# Patient Record
Sex: Male | Born: 1965 | Race: Black or African American | Hispanic: No | Marital: Married | Smoking: Former smoker
Health system: Southern US, Community
[De-identification: ages and names within clinical notes are randomized; demographics above are authoritative.]

## PROBLEM LIST (undated history)

## (undated) ENCOUNTER — Ambulatory Visit: Admission: EM | Payer: Medicare HMO | Source: Home / Self Care

## (undated) DIAGNOSIS — I671 Cerebral aneurysm, nonruptured: Secondary | ICD-10-CM

## (undated) DIAGNOSIS — Z973 Presence of spectacles and contact lenses: Secondary | ICD-10-CM

## (undated) DIAGNOSIS — M109 Gout, unspecified: Secondary | ICD-10-CM

## (undated) DIAGNOSIS — N189 Chronic kidney disease, unspecified: Secondary | ICD-10-CM

## (undated) DIAGNOSIS — I10 Essential (primary) hypertension: Secondary | ICD-10-CM

## (undated) DIAGNOSIS — Z9119 Patient's noncompliance with other medical treatment and regimen: Secondary | ICD-10-CM

## (undated) DIAGNOSIS — I7100 Dissection of unspecified site of aorta: Secondary | ICD-10-CM

## (undated) DIAGNOSIS — H9192 Unspecified hearing loss, left ear: Secondary | ICD-10-CM

## (undated) DIAGNOSIS — G4730 Sleep apnea, unspecified: Secondary | ICD-10-CM

## (undated) DIAGNOSIS — Z91199 Patient's noncompliance with other medical treatment and regimen due to unspecified reason: Secondary | ICD-10-CM

## (undated) HISTORY — DX: Essential (primary) hypertension: I10

## (undated) HISTORY — DX: Dissection of unspecified site of aorta: I71.00

## (undated) HISTORY — DX: Patient's noncompliance with other medical treatment and regimen: Z91.19

## (undated) HISTORY — PX: BACK SURGERY: SHX140

## (undated) HISTORY — DX: Patient's noncompliance with other medical treatment and regimen due to unspecified reason: Z91.199

---

## 1998-10-16 ENCOUNTER — Emergency Department (HOSPITAL_COMMUNITY): Admission: EM | Admit: 1998-10-16 | Discharge: 1998-10-16 | Payer: Self-pay | Admitting: Emergency Medicine

## 1998-10-17 ENCOUNTER — Emergency Department (HOSPITAL_COMMUNITY): Admission: EM | Admit: 1998-10-17 | Discharge: 1998-10-17 | Payer: Self-pay | Admitting: Emergency Medicine

## 1998-11-20 ENCOUNTER — Emergency Department (HOSPITAL_COMMUNITY): Admission: EM | Admit: 1998-11-20 | Discharge: 1998-11-20 | Payer: Self-pay | Admitting: Emergency Medicine

## 1998-11-30 ENCOUNTER — Observation Stay (HOSPITAL_COMMUNITY): Admission: RE | Admit: 1998-11-30 | Discharge: 1998-12-01 | Payer: Self-pay | Admitting: Specialist

## 1998-11-30 ENCOUNTER — Encounter: Payer: Self-pay | Admitting: Specialist

## 1999-03-06 ENCOUNTER — Encounter: Payer: Self-pay | Admitting: Specialist

## 1999-03-06 ENCOUNTER — Encounter (INDEPENDENT_AMBULATORY_CARE_PROVIDER_SITE_OTHER): Payer: Self-pay | Admitting: Specialist

## 1999-03-07 ENCOUNTER — Inpatient Hospital Stay (HOSPITAL_COMMUNITY): Admission: EM | Admit: 1999-03-07 | Discharge: 1999-03-08 | Payer: Self-pay | Admitting: Specialist

## 2001-01-14 ENCOUNTER — Emergency Department (HOSPITAL_COMMUNITY): Admission: EM | Admit: 2001-01-14 | Discharge: 2001-01-14 | Payer: Self-pay | Admitting: Emergency Medicine

## 2002-06-23 ENCOUNTER — Encounter: Payer: Self-pay | Admitting: Internal Medicine

## 2002-06-23 ENCOUNTER — Ambulatory Visit (HOSPITAL_COMMUNITY): Admission: RE | Admit: 2002-06-23 | Discharge: 2002-06-23 | Payer: Self-pay | Admitting: Internal Medicine

## 2002-07-18 ENCOUNTER — Ambulatory Visit (HOSPITAL_COMMUNITY): Admission: RE | Admit: 2002-07-18 | Discharge: 2002-07-18 | Payer: Self-pay | Admitting: Internal Medicine

## 2002-07-18 ENCOUNTER — Encounter: Payer: Self-pay | Admitting: Internal Medicine

## 2002-09-26 ENCOUNTER — Encounter: Payer: Self-pay | Admitting: Neurosurgery

## 2002-09-26 ENCOUNTER — Encounter: Admission: RE | Admit: 2002-09-26 | Discharge: 2002-09-26 | Payer: Self-pay | Admitting: Neurosurgery

## 2002-10-12 ENCOUNTER — Encounter: Admission: RE | Admit: 2002-10-12 | Discharge: 2002-10-12 | Payer: Self-pay | Admitting: Neurosurgery

## 2002-10-12 ENCOUNTER — Encounter: Payer: Self-pay | Admitting: Neurosurgery

## 2003-04-30 ENCOUNTER — Emergency Department (HOSPITAL_COMMUNITY): Admission: EM | Admit: 2003-04-30 | Discharge: 2003-04-30 | Payer: Self-pay | Admitting: Emergency Medicine

## 2004-10-08 ENCOUNTER — Inpatient Hospital Stay (HOSPITAL_COMMUNITY): Admission: EM | Admit: 2004-10-08 | Discharge: 2004-10-09 | Payer: Self-pay | Admitting: Emergency Medicine

## 2004-10-08 ENCOUNTER — Ambulatory Visit: Payer: Self-pay | Admitting: Cardiology

## 2004-10-09 ENCOUNTER — Encounter: Payer: Self-pay | Admitting: Cardiology

## 2004-10-11 ENCOUNTER — Encounter: Payer: Self-pay | Admitting: Cardiovascular Disease

## 2004-10-11 ENCOUNTER — Inpatient Hospital Stay (HOSPITAL_COMMUNITY): Admission: EM | Admit: 2004-10-11 | Discharge: 2004-10-15 | Payer: Self-pay | Admitting: Nurse Practitioner

## 2004-10-13 ENCOUNTER — Ambulatory Visit: Payer: Self-pay | Admitting: Hematology & Oncology

## 2004-10-16 ENCOUNTER — Ambulatory Visit: Payer: Self-pay | Admitting: Internal Medicine

## 2004-10-25 ENCOUNTER — Ambulatory Visit: Payer: Self-pay | Admitting: Cardiology

## 2004-10-25 ENCOUNTER — Emergency Department (HOSPITAL_COMMUNITY): Admission: EM | Admit: 2004-10-25 | Discharge: 2004-10-25 | Payer: Self-pay | Admitting: Emergency Medicine

## 2004-10-25 ENCOUNTER — Ambulatory Visit (HOSPITAL_COMMUNITY): Admission: RE | Admit: 2004-10-25 | Discharge: 2004-10-25 | Payer: Self-pay | Admitting: Cardiology

## 2004-11-22 ENCOUNTER — Ambulatory Visit: Payer: Self-pay | Admitting: Cardiology

## 2005-02-03 ENCOUNTER — Ambulatory Visit: Payer: Self-pay | Admitting: Cardiology

## 2005-03-04 ENCOUNTER — Ambulatory Visit: Payer: Self-pay | Admitting: *Deleted

## 2005-11-19 ENCOUNTER — Emergency Department (HOSPITAL_COMMUNITY): Admission: EM | Admit: 2005-11-19 | Discharge: 2005-11-19 | Payer: Self-pay | Admitting: Emergency Medicine

## 2006-10-19 ENCOUNTER — Emergency Department (HOSPITAL_COMMUNITY): Admission: EM | Admit: 2006-10-19 | Discharge: 2006-10-19 | Payer: Self-pay | Admitting: Emergency Medicine

## 2006-11-06 ENCOUNTER — Inpatient Hospital Stay (HOSPITAL_COMMUNITY): Admission: EM | Admit: 2006-11-06 | Discharge: 2006-12-02 | Payer: Self-pay | Admitting: Emergency Medicine

## 2006-11-24 ENCOUNTER — Ambulatory Visit: Payer: Self-pay | Admitting: Physical Medicine & Rehabilitation

## 2007-03-02 ENCOUNTER — Ambulatory Visit: Payer: Self-pay | Admitting: Cardiology

## 2007-03-05 ENCOUNTER — Ambulatory Visit (HOSPITAL_COMMUNITY): Admission: RE | Admit: 2007-03-05 | Discharge: 2007-03-05 | Payer: Self-pay | Admitting: Interventional Radiology

## 2007-03-16 ENCOUNTER — Encounter: Admission: RE | Admit: 2007-03-16 | Discharge: 2007-06-14 | Payer: Self-pay | Admitting: Neurosurgery

## 2007-03-30 ENCOUNTER — Encounter: Admission: RE | Admit: 2007-03-30 | Discharge: 2007-03-30 | Payer: Self-pay | Admitting: Neurosurgery

## 2007-06-15 ENCOUNTER — Encounter: Admission: RE | Admit: 2007-06-15 | Discharge: 2007-06-22 | Payer: Self-pay | Admitting: Neurosurgery

## 2007-09-02 ENCOUNTER — Ambulatory Visit (HOSPITAL_BASED_OUTPATIENT_CLINIC_OR_DEPARTMENT_OTHER): Admission: RE | Admit: 2007-09-02 | Discharge: 2007-09-02 | Payer: Self-pay | Admitting: *Deleted

## 2007-09-10 ENCOUNTER — Ambulatory Visit: Payer: Self-pay | Admitting: Internal Medicine

## 2007-09-22 ENCOUNTER — Emergency Department (HOSPITAL_COMMUNITY): Admission: EM | Admit: 2007-09-22 | Discharge: 2007-09-22 | Payer: Self-pay | Admitting: Emergency Medicine

## 2007-09-27 ENCOUNTER — Ambulatory Visit (HOSPITAL_BASED_OUTPATIENT_CLINIC_OR_DEPARTMENT_OTHER): Admission: RE | Admit: 2007-09-27 | Discharge: 2007-09-27 | Payer: Self-pay | Admitting: *Deleted

## 2007-10-03 ENCOUNTER — Ambulatory Visit: Payer: Self-pay | Admitting: Internal Medicine

## 2007-12-31 ENCOUNTER — Ambulatory Visit (HOSPITAL_COMMUNITY): Admission: RE | Admit: 2007-12-31 | Discharge: 2007-12-31 | Payer: Self-pay | Admitting: Interventional Radiology

## 2008-02-24 ENCOUNTER — Ambulatory Visit: Payer: Self-pay | Admitting: Cardiology

## 2008-03-22 IMAGING — XA IR ANGIO/CAROTID/CERV BI
1 series · 12 of 24 positions shown · IV contrast (omnipaque)
Comparison: none

CLINICAL DATA: The patient is status post subarachnoid hemorrhage due to ruptured ACOM aneurysm status post endovascular treatment.  
BILATERAL CAROTID ARTERIOGRAMS:
Procedure:  Following the full explanation of the procedure, along with the potentially associated complications, an informed witnessed consent was obtained. 
The right groin was prepped and draped in the usual sterile fashion.  Thereafter, using a modified Seldinger technique, transfemoral access into the right common femoral artery was obtained without difficulty.  Over a 0.035-inch guidewire, a 5-French Pinnacle sheath was inserted.  Through this and also over a 0.035-inch guidewire, a 5-French JB-1 catheter was advanced through the aortic arch region and selectively positioned in the right common carotid artery and the left common carotid artery.  There were no acute complications.  The patient tolerated the procedure well. 
Medications utilized:  Versed 1 mg IV, fentanyl 50 mcg IV. 
Contrast:  Omnipaque 300, approximately 25 cc.

[Series 1: run · 12 of 112 slices shown]
[im 5/112]
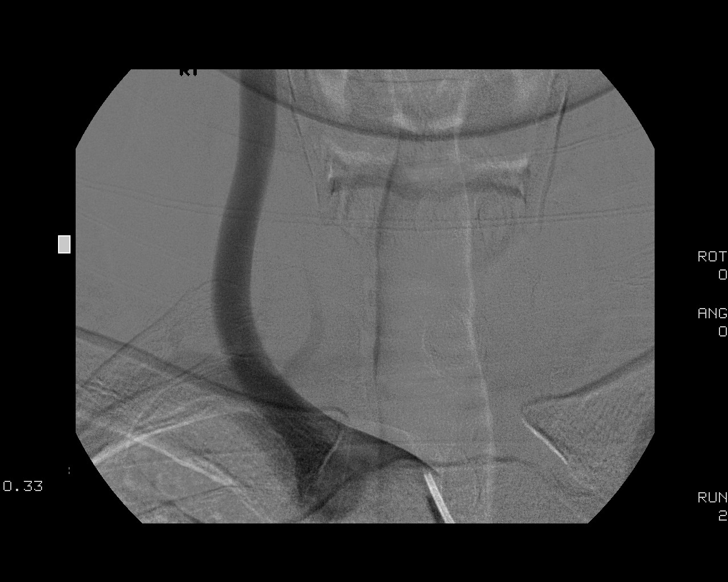
[im 15/112]
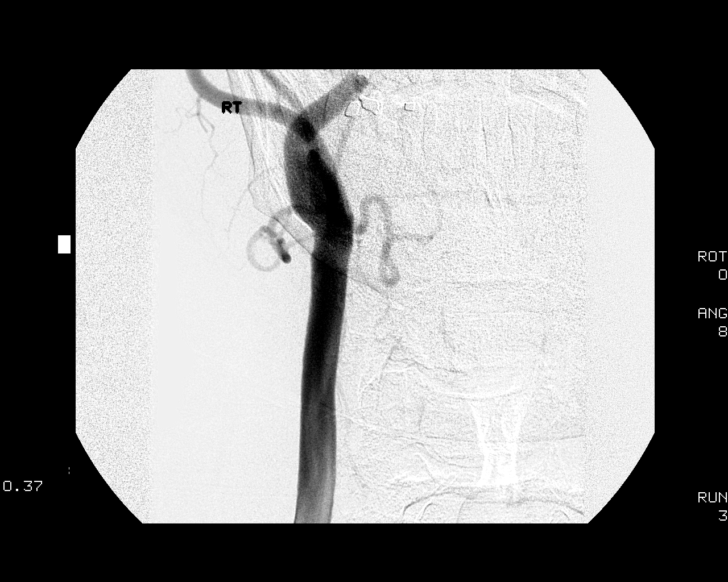
[im 25/112]
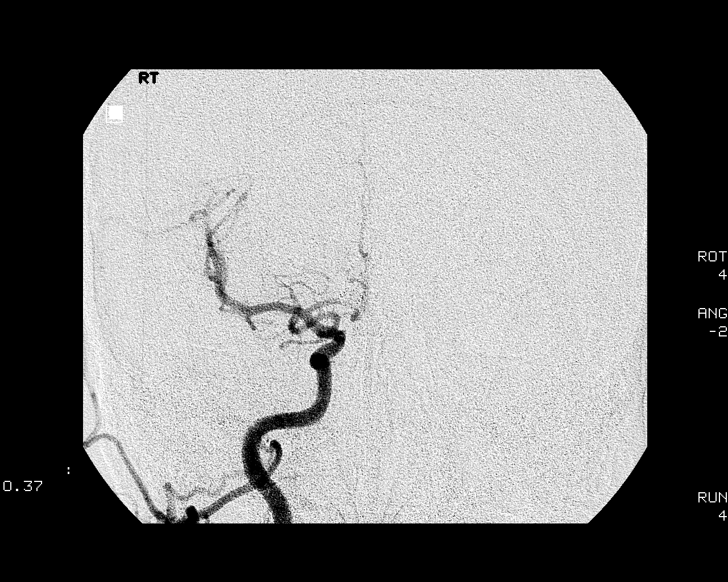
[im 34/112]
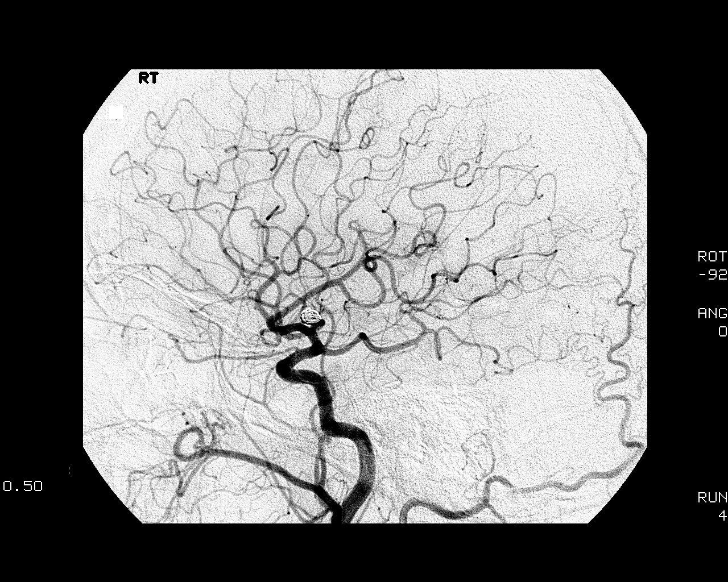
[im 44/112]
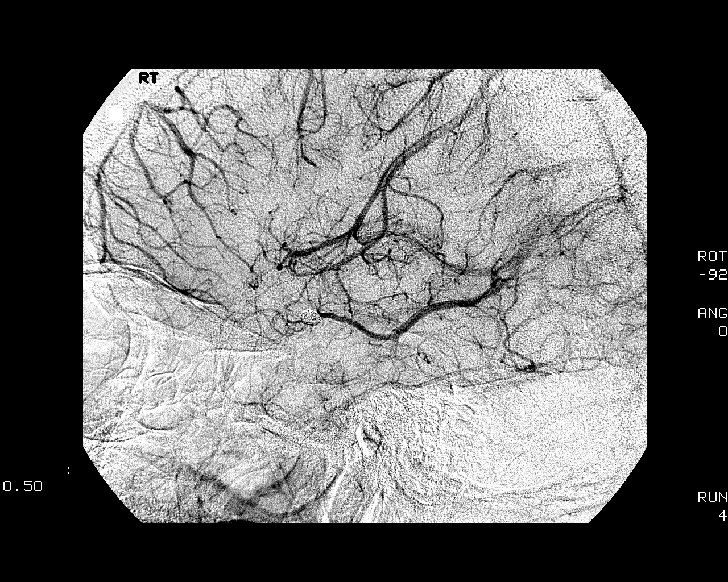
[im 54/112]
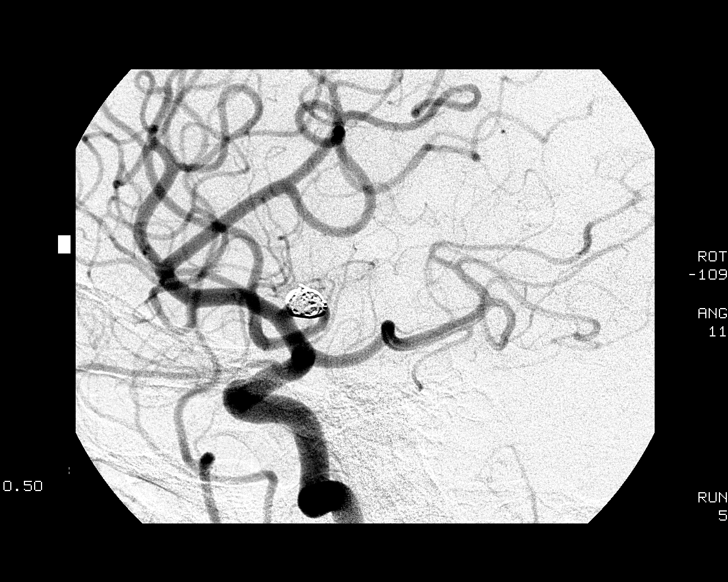
[im 63/112]
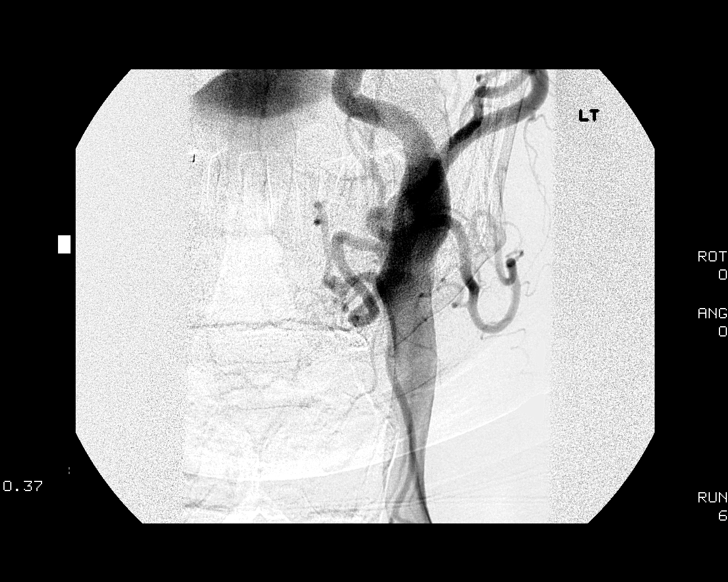
[im 73/112]
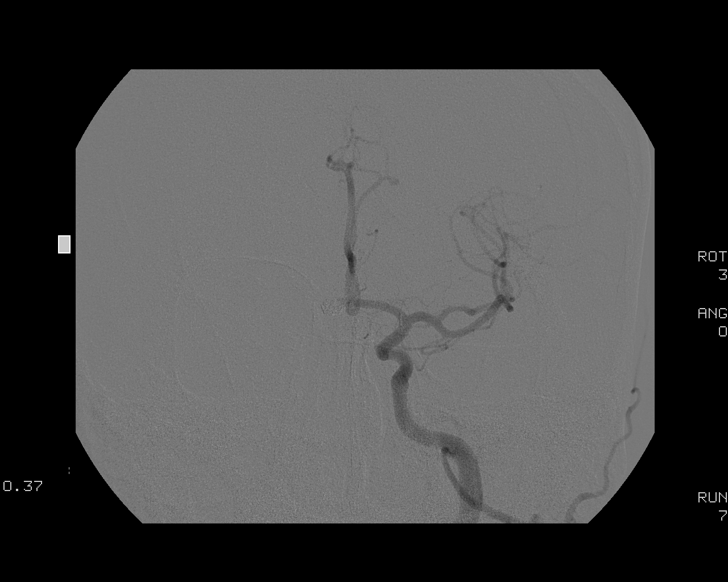
[im 83/112]
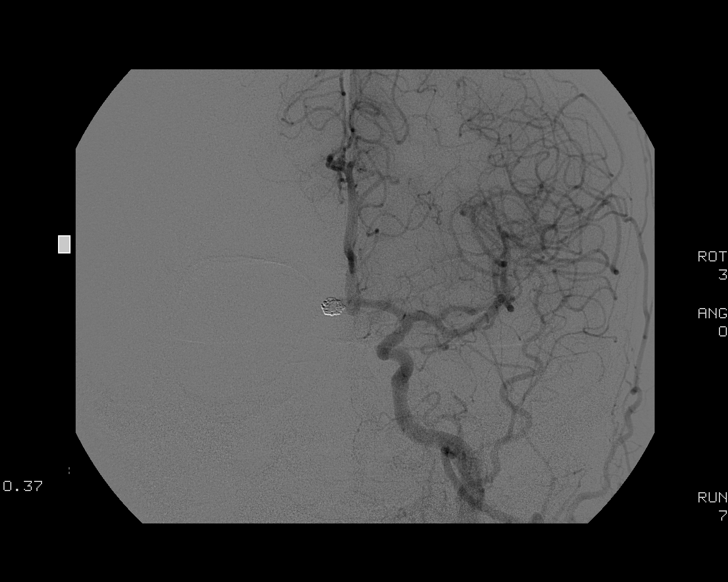
[im 92/112]
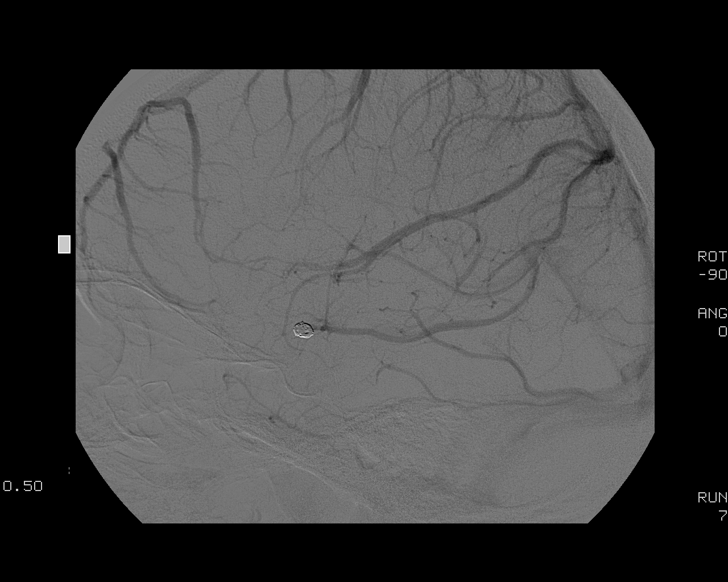
[im 102/112]
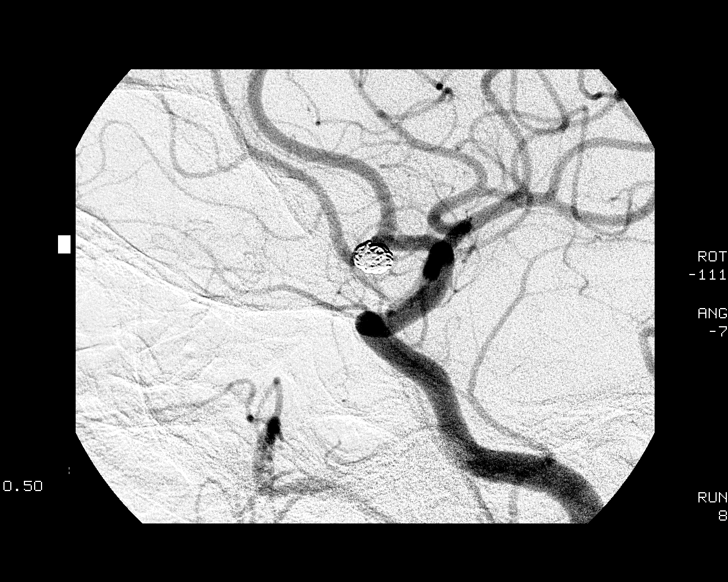
[im 112/112]
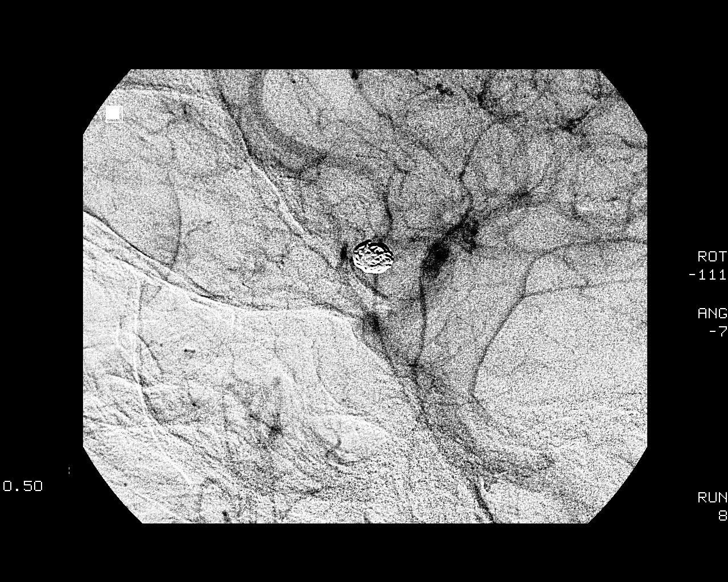

[12 of 24 positions shown; findings below may reference images not displayed]

FINDINGS: The right common carotid arteriogram demonstrates the right external carotid artery and its major branches to be normal.  The right internal carotid artery at the bulb to the cranial skull base is normal.  The petrous, the cavernous, and the supraclinoid segments are normal.  There is a dominant right PCOM opacifying the right PCA and right superior cerebellar artery distribution.  The right middle and the right anterior cerebral arteries are seen to opacify normally into capillary and venous phases.
The left common carotid arteriogram demonstrates the left external carotid artery and its major branches to be normal.  The left internal carotid artery at the bulb has a smooth shallow plaque along the posterior wall.  No significant stenosis is seen.  The vessel is otherwise seen to opacify normally to the cranial skull base.  Normal opacification is seen of the left ICA and its petrous, cavernous, and its supraclinoid segments.  The left middle and the left anterior cerebral arteries are seen to opacify normally into capillary and venous phases.  Opacification via the ACOM of the right ACA distal to the A2 is noted.
Normal opacification is seen of the previously endovascularly-treated ACOM region aneurysm positioned horizontally.  No coil compaction or re-canalization is seen.
IMPRESSION: Endovascularly obliterated ACOM region aneurysm without re-canalization or coil compaction.
The above results were reviewed with the patient.  He does not smoke.  He has been asked to continue taking his medications.  The patient will probably undergo follow-up arteriogram in approximately six to eight months from today.

## 2009-03-14 ENCOUNTER — Emergency Department (HOSPITAL_COMMUNITY): Admission: EM | Admit: 2009-03-14 | Discharge: 2009-03-14 | Payer: Self-pay | Admitting: Emergency Medicine

## 2009-11-23 ENCOUNTER — Encounter: Admission: RE | Admit: 2009-11-23 | Discharge: 2009-11-23 | Payer: Self-pay | Admitting: Internal Medicine

## 2010-05-19 ENCOUNTER — Emergency Department (HOSPITAL_COMMUNITY): Admission: EM | Admit: 2010-05-19 | Discharge: 2010-05-20 | Payer: Self-pay | Admitting: Emergency Medicine

## 2010-09-08 ENCOUNTER — Encounter: Payer: Self-pay | Admitting: Interventional Radiology

## 2010-10-31 LAB — COMPREHENSIVE METABOLIC PANEL
ALT: 10 U/L (ref 0–53)
AST: 12 U/L (ref 0–37)
Albumin: 3.5 g/dL (ref 3.5–5.2)
Alkaline Phosphatase: 71 U/L (ref 39–117)
BUN: 17 mg/dL (ref 6–23)
CO2: 32 mEq/L (ref 19–32)
Calcium: 9.1 mg/dL (ref 8.4–10.5)
Chloride: 101 mEq/L (ref 96–112)
Creatinine, Ser: 1.86 mg/dL — ABNORMAL HIGH (ref 0.4–1.5)
GFR calc Af Amer: 48 mL/min — ABNORMAL LOW (ref >60)
GFR calc non Af Amer: 39 mL/min — ABNORMAL LOW (ref >60)
Glucose, Bld: 97 mg/dL (ref 70–99)
Potassium: 4.2 mEq/L (ref 3.5–5.1)
Sodium: 137 mEq/L (ref 135–145)
Total Bilirubin: 0.6 mg/dL (ref 0.3–1.2)
Total Protein: 7.1 g/dL (ref 6.0–8.3)

## 2010-10-31 LAB — TROPONIN I: Troponin I: 0.01 ng/mL (ref 0.00–0.06)

## 2010-10-31 LAB — CBC
HCT: 44.5 % (ref 39.0–52.0)
Hemoglobin: 14.6 g/dL (ref 13.0–17.0)
MCH: 24.3 pg — ABNORMAL LOW (ref 26.0–34.0)
MCHC: 32.8 g/dL (ref 30.0–36.0)
MCV: 74.1 fL — ABNORMAL LOW (ref 78.0–100.0)
Platelets: 119 10*3/uL — ABNORMAL LOW (ref 150–400)
RBC: 6 MIL/uL — ABNORMAL HIGH (ref 4.22–5.81)
RDW: 16.7 % — ABNORMAL HIGH (ref 11.5–15.5)
WBC: 9.5 10*3/uL (ref 4.0–10.5)

## 2010-10-31 LAB — CK TOTAL AND CKMB (NOT AT ARMC)
CK, MB: 1.6 ng/mL (ref 0.3–4.0)
Relative Index: 1.3 (ref 0.0–2.5)
Total CK: 122 U/L (ref 7–232)

## 2010-10-31 LAB — LIPASE, BLOOD: Lipase: 267 U/L — ABNORMAL HIGH (ref 11–59)

## 2010-12-31 NOTE — Assessment & Plan Note (Signed)
[place] OFFICE NOTE   NAME:Downs, [name]                         MRN:          [id number]  DATE:03/02/2007                            DOB:          [birth year]    PRIMARY:  None.   REASON FOR PRESENTATION:  Evaluate the patient with aortic dissection.   HISTORY OF PRESENT ILLNESS:  The patient returns after a 2-year absence.  He has a history of aortic dissection and hypertension.  Since I last  saw him in March of this year, he was hospitalized with a ruptured  cerebral aneurysm.  Subarachnoid hemorrhage and resultant right-sided  hemiparesis.  He has recovered nicely, but still walks with a walker.  He is back at home living with his mom.  Through all of this, he  apparently had no cardiac complications, as we were not consulted.  He  said he did have some elevated blood pressures when he was recovering at  the rehabilitation home, but these have resolved.  He currently denies  any cardiac symptoms.  He gets around with a walker and ambulates fairly  well.  He has not been having any chest discomfort, neck or arm  discomfort.  He has been having no back pain.  He has no shortness of  breath, PND, or orthopnea.  He is not noticing any palpitations, pre-  syncope, or syncope.   PAST MEDICAL HISTORY:  Type B aortic dissection.  Hypertension with  medical noncompliance.  Previous tobacco use and polysubstance abuse (he  is avoiding both).   ALLERGIES:  None.   CURRENT MEDICATIONS:  1. Lisinopril hydrochlorothiazide 20/25 b.i.d.  2. Clonidine 0.1 mg if his blood pressure is over 160/90.  3. Amlodipine 5 mg daily.   REVIEW OF SYSTEMS:  As stated in the HPI and otherwise negative for  other systems.   PHYSICAL EXAMINATION:  The patient is in no distress.  Weight 217 pounds.  Blood pressure 139/89, heart rate 61 and regular.  HEENT:  Eyelids unremarkable.  Pupils equal, round, and reactive to  light.   Fundi not visualized.  Oral mucosa unremarkable.  NECK:  No jugular venous distension.  Wave form within normal limits.  Carotid upstroke brisk and symmetric.  No bruits.  No thyromegaly.  LYMPHATICS:  No cervical, axillary, or inguinal adenopathy.  LUNGS:  Clear to auscultation bilaterally.  BACK:  No costovertebral angle tenderness.  CHEST:  Unremarkable.  HEART:  PMI not displaced or sustained.  S1, S2 within normal limits.  No S3.  No S4.  No clicks, rubs, or murmurs.  ABDOMEN:  Flat.  Positive bowel sounds.  Normal in frequency and pitch.  No bruits, rebound, guarding.  No midline pulsatile masses,  hepatomegaly, splenomegaly.  SKIN:  No rashes, no nodules.  EXTREMITIES:  2+ pulses throughout.  No cyanosis, clubbing, or edema.  NEURO:  Oriented to person, time, and place.  Cranial nerves 2-12 are  intact.  Motor grossly intact.   EKG:  Sinus rhythm, rate 58.  Axis within normal limits.  Intervals  within normal limits.  No  acute ST-T wave changes.   ASSESSMENT AND PLAN:  1. Hypertension.  Blood pressure very well controlled now.  No further      changes will be made to his medical regimen.  No further      cardiovascular testing is suggested.  2. Aortic dissection.  He is not having any pain.  There is no      suggestion that this needs to be re-imaged.  The management will be      control of his blood pressure.  3. Followup.  Will see him back in 1 year or sooner if needed.     Minus Breeding, MD, [facility]  Electronically Signed    JH/MedQ  DD: 03/02/2007  DT: 03/03/2007  Job #: [id number]

## 2010-12-31 NOTE — Procedures (Signed)
NAME:  [name] Downs, [name] Downs                ACCOUNT NO.:  [id number]   MEDICAL RECORD NO.:  [id number]          PATIENT TYPE:  OUT   LOCATION:  SLEEP CENTER                 FACILITY:  [facility]   PHYSICIAN:  Clinton D. Annamaria Boots, MD, FCCP, FACPDATE OF BIRTH:  1964/06/24   DATE OF STUDY:  09/27/2007                            NOCTURNAL POLYSOMNOGRAM   REFERRING PHYSICIAN:  Lawanda Cousins, Dr.   Katy Apo FOR STUDY:  Hypersomnia with sleep apnea.   EPWORTH SLEEPINESS SCORE:  8/24, BMI 31.5, weight 232 pounds, height 72  inches, neck 15 inches.   HOME MEDICATIONS:  Charted and reviewed.   A baseline diagnostic NPSG on September 02, 2007, reported an AHI of 9.7  per hour.  CPAP titration is requested.   SLEEP ARCHITECTURE:  Total sleep time 334 minutes with sleep efficiency  89.9%.  Stage I was 6.6%, stage II 77.1%, stage III absent, REM 16.3% of  total sleep time.  Sleep latency 7 minutes.  REM latency 64 minutes.  Awake after sleep onset 30 minutes.  Arousal index 12.9.  Bedtime  medication included gabapentin, cyclobenzaprine, hydrocodone, and  methylprednisolone, all taken at 9:30 p.m.   RESPIRATORY DATA:  CPAP titration protocol.  CPAP was titrated to 11-  CWP, AHI zero per hour.  A medium Mirage Quattro mask was used with  heated humidifier.   OXYGEN DATA:  Snoring was essentially prevented by CPAP with oxygen  saturation holding at a mean of 96.6% on room air.   CARDIAC DATA:  Normal sinus rhythm.   MOVEMENT-PARASOMNIA:  No movement disturbance.  No bathroom trips.   IMPRESSIONS-RECOMMENDATIONS:  1. Successful CPAP titration to 11-CWP, apnea/hypopnea index zero per      hour.  He chose a medium Mirage Quattro full face mask with heated      humidifier.  2. Baseline diagnostic nocturnal polysomnogram on September 02, 2007,      had reported an apnea/hypopnea index of 9.7 per hour.      Clinton D. Annamaria Boots, MD, [facility], FACP  Diplomate, Tax adviser of Sleep Medicine  Electronically  Signed     CDY/MEDQ  D:  10/03/2007 10:47:06  T:  10/04/2007 16:30:43  Job:  [id number]

## 2010-12-31 NOTE — Assessment & Plan Note (Signed)
[place] OFFICE NOTE   NAME:[name] Downs                         MRN:          [id number]  DATE:02/24/2008                            DOB:          1965/10/31    PRIMARY CARE PHYSICIAN:  Nolene Ebbs, MD   REASON FOR PRESENTATION:  Evaluate patient with hypertension and aortic  dissection.   HISTORY OF PRESENT ILLNESS:  The patient returns for yearly followup.  Since I last saw him, he has done well.  He has had no further events  related to his hypertension or dissection.  He still lives with his  mother.  He is no longer walking with a walker.  He had to do this after  a cerebral aneurysm ruptured.  He has recovered quite a bit of strength,  but he still has some right leg weakness.  He has not been having any  chest pressure or neck or arm discomfort other than some right arm  tingling.  He still sees a neurologist and is supposed to get splints  for his wrist, but he has not worn these.  He has some fleeting chest  discomfort but this is rare.  He is able to do some walking.  He takes  his blood pressure medications.  He takes clonidine if his eyes are  red.  This has been a p.r.n. medication for hypertension, but he does  not have a blood pressure cuff at home.   PAST MEDICAL HISTORY:  1. Type B aortic dissection.  2. Hypertension, with medical noncompliance.  3. Subarachnoid hemorrhage, with resultant right-sided hemiparesis.  4. Ongoing tobacco use.   ALLERGIES:  None.   MEDICATIONS:  1. Gabapentin.  2. Lisinopril/HCT 20/25 b.i.d.  3. Clonidine p.r.n.  4. Amlodipine 5 mg daily.   REVIEW OF SYSTEMS:  As stated in the HPI and otherwise negative for  other systems.   PHYSICAL EXAMINATION:  GENERAL:  The patient is in no distress.  VITAL SIGNS:  Blood pressure 107/73, heart rate 70 and regular, weight  233 pounds, and body mass index 32.  NECK:  No jugular venous distention at 45 degrees.   Carotid upstroke  brisk and symmetrical.  No bruits, no thyromegaly.  LYMPHATICS:  No adenopathy.  LUNGS:  Clear to auscultation bilaterally.  BACK:  No costovertebral angle tenderness.  CHEST:  Unremarkable.  HEART:  PMI not displaced or sustained.  S1 and S2 within normal limits.  No S3, no S4, no clicks, no rubs, no murmurs.  ABDOMEN:  Mildly obese, positive bowel sounds, normal in frequency and  pitch.  No bruits, no rebound, no guarding, no midline pulsatile mass,  and no organomegaly.  SKIN:  No rashes, no nodules.  EXTREMITIES:  Pulses 2+.  No edema.   EKG:  Sinus rhythm, rate 72, axis within normal limits, intervals within  normal limits, no acute ST-wave changes.   ASSESSMENT AND PLAN:  1. Hypertension.  Blood pressure is currently well controlled.  No      further cardiovascular testing is suggested.  He will continue with  the medical regimen as listed.  2. Aortic dissection.  There is no evidence to suggest progression of      this.  No further cardiovascular testing is suggested.  The      management will continue to be management of his blood pressure.  3. Tobacco.  He was told to quit his 5 cigarettes a day and,      hopefully, he can comply with this.  4. Followup.  We will see him back in 1 year, sooner if needed.     Minus Breeding, MD, [facility]  Electronically Signed    JH/MedQ  DD: 02/24/2008  DT: 02/25/2008  Job #: [id number]

## 2010-12-31 NOTE — Procedures (Signed)
NAME:  [name] Downs, [name] Downs                ACCOUNT NO.:  [id number]   MEDICAL RECORD NO.:  [id number]          PATIENT TYPE:  OUT   LOCATION:  SLEEP CENTER                 FACILITY:  [facility]   PHYSICIAN:  Clinton D. Annamaria Boots, MD, FCCP, FACPDATE OF BIRTH:  09/06/1963   DATE OF STUDY:  09/02/2007                            NOCTURNAL POLYSOMNOGRAM   REFERRING PHYSICIAN:  Amil Amen, Dr   INDICATION FOR STUDY:  Hypersomnia with sleep apnea.   EPWORTH SLEEPINESS SCORE:  8/24, BMI 31.5, weight 232 pounds, height 72  inches, neck 15 inches.   HOME MEDICATIONS:  Charted and reviewed.   SLEEP ARCHITECTURE:  Total sleep time 345 minutes with sleep efficiency  65.2%.  Stage 1 was 8.1%, stage 2 73.8%, stage 3 absent, REM 18.1% of  total sleep time.  Sleep latency 47 minutes, REM latency 54 minutes,  awake after sleep onset 42 minutes, arousable index 4.5.  Gabapentin was  taken at bedtime.   RESPIRATORY DATA:  Apnea hypopnea index (AHI) of 9.7 obstructive events  per hour indicating mild obstructive sleep apnea/hypopnea syndrome.  Respiratory disturbance index (RDI) confirms at 10.6 per hour.  A total  of 56 respiratory events were scored of which 10 were obstructive  apneas, 3 central apneas and 43 hypopneas.  Most events were while  sleeping in sides.  Diagnostic NPSG was requested and CPAP titration was  therefore not done.   OXYGEN DATA:  Mild snoring with oxygen desaturation to a nadir of 90%.  Mean oxygen saturation through the study was 95.8% on room air.   CARDIAC DATA:  Normal sinus rhythm.   MOVEMENT-PARASOMNIA:  No significant movement disturbance.  Bathroom x1.   IMPRESSIONS-RECOMMENDATIONS:  1. Sleep architecture marked by frequent brief wakings through the      study which may reflect unfamiliar environment.  2. Mild obstructive sleep apnea/hypopnea syndrome, AHI 9.7 per hour      (normal range 0-5 per hour).  The events were most common while      sleeping on sides.  Mild  snoring with oxygen desaturation to a      nadir of 90%.  3. Scores in this range are borderline for treatment with CPAP.      Consider return for CPAP titration if      conservative measures including treatment for any significant      nasopharyngeal obstruction are unhelpful and symptoms or      complications of sleep apnea warrant ongoing therapy.  Weight loss      may be useful.      Clinton D. Annamaria Boots, MD, [facility], FACP  Diplomate, Tax adviser of Sleep Medicine  Electronically Signed     CDY/MEDQ  D:  09/11/2007 09:44:01  T:  09/11/2007 20:56:59  Job:  [id number]

## 2011-01-03 NOTE — H&P (Signed)
[name] Downs, [name] Downs                ACCOUNT NO.:  [id number]   MEDICAL RECORD NO.:  [id number]          PATIENT TYPE:  INP   LOCATION:  1824                         FACILITY:  [place]   PHYSICIAN:  Minus Breeding, M.D.   DATE OF BIRTH:  1965-12-29   DATE OF ADMISSION:  10/08/2004  DATE OF DISCHARGE:                                HISTORY & PHYSICAL   PRIMARY CARE PHYSICIAN:  [facility]   REASON FOR ADMISSION:  A patient with chest pain.   HISTORY OF PRESENT ILLNESS:  The patient is a 45 year old African-American  gentleman with no prior cardiac history.  He has longstanding hypertension  which he does not treat.  He says that he awoke this morning at 2 a.m. from  his sleep with chest pain.  He described it as a sharp somewhat stabbing  discomfort that would come and go in short bursts.  It was substernal. There  was no radiation to his arm or to his back. He had no discomfort between his  shoulder blades. He had no jaw discomfort.  It was 7/10 in intensity.  He  did notice that it seemed to be better when he walked.  It was better after  passing gas and burping. He thinks that it might be slightly related to  position. He had never had discomfort like this before.  There was no  associated nausea, vomiting, or diaphoresis.  He did get nitroglycerin in  the emergency room without relief.  He was hypertensive on admission.  He  had no acute EKG changes but does appear to have LVH with repolarization.  He had no enzyme elevations.   PAST MEDICAL HISTORY:  Hypertension x years (he is not compliant with  medications).  Tobacco abuse.   PAST SURGICAL HISTORY:  Back surgery x2.   ALLERGIES:  None.   MEDICATIONS:  Blood pressure medicines.  The patient does not know which  one.   SOCIAL HISTORY:  The patient lives in [place] with his mother. He is  single, he is unemployed.  He drinks 7-8 beers per day.  He remotely used  cocaine.  He smokes 1-1/2 packs per day and has smoked  5-6 years.   FAMILY HISTORY:  Noncontributory for early coronary artery disease, though  his mother is alive at age 32 with heart trouble.   REVIEW OF SYSTEMS:  As stated in the HPI, positive for sinus problems, dry  cough, blurred vision, indigestion, otherwise negative for all other  systems.   PHYSICAL EXAMINATION:  VITAL SIGNS:  The patient is in no distress.  Blood  pressure 171/105 on admission (currently 138/90), afebrile, heart rate 63  and regular.  HEENT:  Eyes unremarkable.  Pupils are equal, round, and reacted to light.  Fundi not visualized.  Oral mucosa unremarkable.  NECK:  No jugular venous distention.  Waveform within normal limits. Carotid  upstrokes brisk and symmetrical, no bruits.  No thyromegaly. Lymphatics, no  cervical, axillary or inguinal adenopathy.  LUNGS:  Clear to auscultation bilaterally.  BACK:  No costovertebral angle tenderness.  CHEST:  Unremarkable.  HEART:  PMI displaced and sustained.  S1-S2 within normal limits.  No S3-S4  or murmurs.  ABDOMEN:  Flat.  Positive bowel sounds, normal in frequency and pitch,  no  bruits.  No rebound, guarding, no midline pulsatile mass, no  hepatosplenomegaly or splenomegaly.  SKIN:  No rashes, or nodules.  EXTREMITIES:  2+ pulses throughout, no edema, no cyanosis or clubbing.  NEUROLOGIC:  Oriented to person, place and time.  Cranial nerves II-XII  grossly intact.  Motor grossly intact.   EKG:  Sinus bradycardia, rate 55, axis within normal limits, intervals  within normal limits.  Left ventricular hypertrophy with repolarization  changes.  No old EKGs for comparison.   LABS:  Point of care markers are negative x2.  WBC 11.7, hemoglobin 12.8,  platelets 112.  Sodium 142, potassium 3.3, creatinine 1.3.  Chest x-ray no  acute disease.   ASSESSMENT AND PLAN:  1.  Chest pain.  The patient's chest pain is atypical.  However, he does      have significant risk factors, therefore, I think proceeding with an       admission and ruling out myocardial infarction with symptoms would be      warranted.  The patient can then have a stress perfusion study.  I will      also get an echocardiogram to quantify the degree of LVH he might have      and also to rule out any effusion, though I do not hear a rub. If the      above tests are unremarkable.  The patient would be discharged with no      further cardiac workup.  2.  Hypertension.  Blood pressure was uncontrolled.  We will start him on      Lotensin as this may be what he was on at home. We will ask his mom to      bring in his medications to confirm.  I reiterated the need to take care      of his blood pressure.  3.  EtOH.  He will be counseled about the need to stop drinking.  4.  Tobacco.  He will be counseled about the need to stop smoking.  5.  Risk reduction.  We will check lipids.  6.  Low platelets.  Wonder if this is related to his alcohol and bone marrow      suppression; will followup on these with repeat CBC.      JH/MEDQ  D:  10/08/2004  T:  10/08/2004  Job:  [id number]

## 2011-01-03 NOTE — Discharge Summary (Signed)
NAMETOLLIVER, Downs                ACCOUNT NO.:  [id number]   MEDICAL RECORD NO.:  [id number]          PATIENT TYPE:  INP   LOCATION:  [id number]                         FACILITY:  [place] [facility]   PHYSICIAN:  Minus Breeding, M.D.   DATE OF BIRTH:  Feb 19, 1966   DATE OF ADMISSION:  10/08/2004  DATE OF DISCHARGE:  10/09/2004                           DISCHARGE SUMMARY - REFERRING   PROCEDURES:  1.  Adenosine Cardiolite October 09, 2004.  2.  A 2-D echocardiogram.   REASON FOR ADMISSION:  [name] is a 45 year old male, with no known history  of heart disease but with cardiac risk factors notable for hypertension and  tobacco, who presented to the emergency room with chest pain and an abnormal  electrocardiogram.  Please refer to admission note for full details.   LABORATORY DATA:  Normal hemoglobin, hematocrit and white count, platelets  110 on admission.  D-dimer 0.43.  Cardiac enzymes:  Normal CPK-MB and  troponin I markers (x3).  Potassium 3.3 on admission, 3.7 follow-up.  Normal  renal function.  Liver profile:  Total cholesterol 138, triglycerides 63,  HDL 54, LDL 71.   Admission chest x-ray:  No active disease.   HOSPITAL COURSE:  Patient was admitted for further evaluation of chest pain,  felt to be atypical, but in the setting of multiple cardiac risk factors and  abnormal electrocardiogram suggestive of inferolateral ST abnormalities with  symmetric T-wave inversion in the inferior leads.   Serial cardial markers were all within normal limits.  D-dimer was also  negative.   Patient was thus referred for stress testing.  He initially was placed on a  treadmill, but was unable to proceed secondary to persistent back pain.  This was changed to an adenosine and perfusion imaging revealed no evidence  of infarction/ischemia; EF 54%.   Additionally, 2-D echocardiogram  was done to exclude wall motion  abnormality, left ventricular hypertrophy, or possible pericardial effusion.  This  revealed preserved left ventricular function (50-55%) with no wall  motion abnormalities; mild aortic and mitral regurgitation, however, was  noted.   Patient was treated with Indocin for possible musculoskeletal etiology and  question of pericardial effusion.  Again, no pericardial effusion was noted  on echocardiogram.   Patient was also referred for smoking cessation consult.   Patient had no further chest discomfort.  He had run out of his  antihypertensive medications on admission and was placed on Lotensin 10  daily.  He will need to follow [facility] at [facility] for continued monitoring of  hypertension.   The patient will also be referred back to Dr. Percival Spanish with an office follow-  up for reassessment of mild valvular heart disease.  He will need to be  placed on SVE prophylaxis.   DISCHARGE MEDICATIONS:  1.  Lotensin 10 mg daily.  2.  Indocin 25 mg b.i.d. (x1 week).   INSTRUCTIONS:  1.  SVE prophylaxis prior to dental procedures.  2.  Stop smoking.  3.  Follow up with Dr. Minus Breeding on Friday, October 25, 2004, at 9:30      a[place]  4.  Follow up at [facility] for hypertension management.   DISCHARGE DIAGNOSES:  1.  Atypical chest pain.      1.  Normal cardiac enzymes.      2.  Normal adenosine Myoview, ejection fraction 54%, October 09, 2004.  2.  Mild valvular heart disease.      1.  Mild aortic/mitral regurgitation.  3.  Hypertension.  4.  Tobacco.  5.  History of substance abuse.  6.  Sinus bradycardia.  7.  Status post hypokalemia.      GS/MEDQ  D:  10/09/2004  T:  10/09/2004  Job:  [id number]   cc:   [facility] in [place]

## 2011-01-03 NOTE — Discharge Summary (Signed)
[name] Downs, [name] Downs                ACCOUNT NO.:  [id number]   MEDICAL RECORD NO.:  [id number]          PATIENT TYPE:  INP   LOCATION:  3025                         FACILITY:  [facility]   PHYSICIAN:  Faythe Ghee, M.D. 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   PRIMARY PROCEDURE:  Interventional radiology with intravascular coiling  of the aneurysm.   HISTORY:  Mr. Schowalter is a 45 year old gentleman in his usual state of  health until the morning of admission where he developed a severe  headache.  He came to the emergency room and was found to have a diffuse  subarachnoid hemorrhage.  Angiography showed an anterior communicating  artery aneurysm which was treated with coiling.  He was admitted for  hypovolemic therapy.  Over subsequent days, the patient became more  alert and responsive.  He was following complex commands.  He was  followed with serial CT scans which showed no evidence of significant  hydrocephalus and; therefore, no ventriculostomy was needed.  He  continued to show steady improvement and after a few weeks of  hypovolemic therapy and no evidence of vasospasm, hypovolemic therapy  was slowly withdrawn.  As he continued to progress, decisions were made  regarding his best disposition.  It was eventually decided that he was  best for skilled nursing facility and he was discharged on April 16.  His condition was remarkably improved versus admission.           ______________________________  Faythe Ghee, M.D.     ROK/MEDQ  D:  02/04/2007  T:  02/04/2007  Job:  [id number]

## 2011-01-03 NOTE — Discharge Summary (Signed)
[name] Downs, [name] Downs                ACCOUNT NO.:  [id number]   MEDICAL RECORD NO.:  [id number]          PATIENT TYPE:  INP   LOCATION:  3302                         FACILITY:  [facility]   PHYSICIAN:  Amiera Herzberg Dictator       DATE OF BIRTH:  1965/12/22   DATE OF ADMISSION:  10/11/2004  DATE OF DISCHARGE:  10/15/2004                                 DISCHARGE SUMMARY   CARDIOLOGIST:  Minus Breeding, M.D.   PRIMARY CARE:  [facility] in [place], [place].   DISCHARGING DIAGNOSES:  1.  Type B aortic dissection.  2.  Ethanol abuse.  3.  Low platelet count being followed by hemo-oncology.   PAST MEDICAL HISTORY:  1.  Hypertension.  2.  Tobacco abuse.  3.  Ethanol abuse.  4.  Remote use of cocaine.  5.  Back surgery x 2.  6.  Noncompliance with medications.   PROCEDURES THIS ADMISSION:  1.  A 2-D echocardiogram on October 09, 2004, showing an EF of 50-55%, no      ventricular regional wall motion abnormalities, mild aortic valve      regurgitation and mild mitral valve regurgitation.  2.  A 2-D echocardiogram repeated on October 11, 2004, showing an EF of 50-      55%, negative for pericardial effusion and left ventricular wall      thickness mildly increased.  3.  Portable chest x-ray on October 08, 2004, showing no active disease.  4.  Stress test on October 09, 2004.  No reversible or irreversible defect      seen to suggest ischemia or infarction.  EF calculated at 54%.  5.  Spiral CT of the chest on October 12, 2004.  Heart normal in size.      Mild left basilar atelectasis.  Small left pleural effusion and left      lower lobe atelectasis.  Type B aortic dissection.  False lumen and      thrombosed small left effusion.  No pulmonary embolus.  6.  CT of the abdomen on October 12, 2004.  The aortic dissection continues      into the upper abdomen and in this just above the origin of the celiac      artery.  7.  Ultrasound of the abdomen on October 12, 2004, negative  for gallstones.      No acute abnormality in the abdomen.  The thoracic aortic dissection      appears to terminate in the upper abdominal aorta.  There is no aortic      aneurysm.   DISCHARGING MEDICATIONS:  1.  Lotensin 10 mg p.o. b.i.d.  2.  Labetalol 400 mg p.o. b.i.d.  3.  Tylenol for general discomfort.   ACTIVITY:  As tolerated.   DIET:  Low salt.   SPECIAL INSTRUCTIONS:  Tobacco and alcohol cessation.   FOLLOWUP:  Follow up on October 25, 2004, at 9:30 a.m. with Dr. Percival Spanish.  The  patient will scheduled a followup appointment at [facility] at his  convenience.   HISTORY OF PRESENT ILLNESS:  This is a 45 year old African American  male,  discharged from [facility] prior to this admission for atypical chest pain.  At that time, cardiac workup was negative.  Stress test was negative.  On  the morning of this admission, he woke with sharp stabbing precordial  discomfort radiating to his upper abdomen and back.  He presented to the  emergency room for further workup.  The patient also had thrombocytopenia.   HOSPITAL COURSE:  Hemo-oncology consult placed.  They followed the patient  as needed based on further pending laboratory work.  The patient continued  to have chest pain throughout admission.  Medications adjusted for further  control of blood pressure in the setting of type B dissection.  ETOH  prophylaxis treatment for DTs given.  Dr. Percival Spanish in to see the patient.  Discussed at length with the patient the severity of his disease and the  need to take his medications for blood pressure control.  He also spoke with  the patient at length about the need to stop smoking, drinking and use of  cocaine.  The patient continued to improve.  Vital signs stable.  The plan  is to discharge home.  Discharge blood pressure 122/58, heart rate 56 and  patient saturating 98% on room air.   DISPOSITION:  Pending case management consult for medication assistance, the  patient will be  discharged home.      MB/MEDQ  D:  10/15/2004  T:  10/15/2004  Job:  [id number]   cc:   Minus Breeding, M.D.   [place], [place]

## 2011-01-03 NOTE — Discharge Summary (Signed)
[name] Downs, [name] Downs                ACCOUNT NO.:  [id number]   MEDICAL RECORD NO.:  [id number]          PATIENT TYPE:  INP   LOCATION:  3025                         FACILITY:  [facility]   PHYSICIAN:  Faythe Ghee, M.D. 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   PRIMARY PROCEDURE:  Interventional radiology coiling of aneurysm.   HISTORY:  Mr. Soliven is a 45 year old gentleman who was in his usual state  of health until early the morning of admission when he developed a  severe headache.  He came to the emergency room with increased blood  pressure.   HOSPITAL COURSE:  CT scan showed diffuse subarachnoid hemorrhage.  He  had an angiogram which showed an anterior communicating artery aneurysm  and was treated with coiling immediately.  After the procedure he was  treated with hypovolemic therapy along with Nimotop, albumin and Hespan.  He was extremely well hydrated and developed no signs of vasospasms in  spite of the large amount of subarachnoid blood.  After approximately 11  days of aggressive therapy, the fluids were decreased and yet he still  showed no signs of difficulties.  He was then able to increase his  activities.  CT scans on a serial basis showed no evidence of increase  in ventricular size.  By mid April, physical medicine was consulted.  Unfortunately they did not feel that he was a candidate for  comprehensive inpatient rehabilitation and they recommended some sort of  skilled nursing facility for intermittent treatment for the next few  weeks until he was able to be discharged.  That was arranged and on  April 16, he was ready for discharge.   DISCHARGE MEDICATIONS:  Pain medications but nothing else.   CONDITION ON DISCHARGE:  His condition was markedly improved  versus  admission.           ______________________________  Faythe Ghee, M.D.     ROK/MEDQ  D:  12/02/2006  T:  12/02/2006  Job:  [id number]

## 2011-01-03 NOTE — H&P (Signed)
[name] Downs, [name] Downs                ACCOUNT NO.:  [id number]   MEDICAL RECORD NO.:  [id number]          PATIENT TYPE:  INP   LOCATION:  [id number]                         FACILITY:  [facility]   PHYSICIAN:  Jacqulyn Ducking, M.D.  DATE OF BIRTH:  01/13/1966   DATE OF ADMISSION:  10/11/2004  DATE OF DISCHARGE:                                HISTORY & PHYSICAL   REFERRING PHYSICIAN:  Rae Lips, N.P. [facility].   PRIMARY CARE PHYSICIAN:  [facility].  Primary cardiologist is Minus Breeding, M.D.   HISTORY OF PRESENT ILLNESS:  A 45 year old gentleman discharged from [facility] two days ago after admission for atypical chest pain. Cardiac markers  and EKGs were unremarkable. A stress Myoview study was negative. He  initially did well, but awoke this morning with sharp and stabbing  precordial discomfort radiating to the upper abdomen and back. There was no  associated dyspnea nor diaphoresis. There was some mild nausea, but no  emesis. He took aspirin without relief. There is a pleuritic component to  the pain. It has been waxing and waning. It is worse when he is supine.   An echocardiogram was performed during his last admission and was negative  for pericardial fluid. Nonetheless, he was given a prescription for Indocin  which he has not yet filled.   Details of history are otherwise available from the notes of his recent  hospital admission.   PHYSICAL EXAMINATION:  GENERAL:  Pleasant, slightly uncomfortable, well-  proportioned gentleman in no acute distress.  VITAL SIGNS:  Temperature is 98.5, blood pressure 160/105, heart rate 55 and  regular, respirations 16, O2 saturation 99% on room air.  HEENT:  Anicteric sclerae.  NECK:  No jugular venous distension; normal carotid upstrokes without  bruits.  LUNGS:  Few bibasilar rales.  THORAX:  No chest wall tenderness.  CARDIAC: Normal first and second heart sounds; fourth heart sound present.  ABDOMEN: Soft and nontender; normal bowel  sounds; no organomegaly; no  masses.  EXTREMITIES: Normal distal pulses; no edema.   LABORATORY DATA:  Initial laboratory is unremarkable including a normal  white count, normal renal function. Potassium is 3.2. Platelets are 105.   EKG: Sinus bradycardia; prominent voltage; ST and T-wave abnormalities  consistent with inferolateral ischemia. These are unchanged from an EKG of  10/08/2004.   IMPRESSION:  Young gentleman with relatively modest cardiovascular risk  factors, EKG abnormalities and persistent atypical chest pain. He does have  significant hypertension. His EKG could reflect a slight left ventricular  hypertrophy which was not appreciated or apparent on his echocardiogram. A D-  dimer will be repeated. Amylase and lipase will also be obtained due to his  concurrent abdominal discomfort. An echocardiogram will be obtained since  his history is consistent with pericarditis, to look for newly developed  pericardial effusion. If all the studies are negative, we will plan to  proceed with cardiac catheterization on Monday to exclude coronary disease.   The patient has thrombocytopenia at present. This was also present a few  days ago. Hematology consultation will be obtained to further pursue this  issue.      RR/MEDQ  D:  10/11/2004  T:  10/11/2004  Job:  [id number]

## 2011-05-14 LAB — BASIC METABOLIC PANEL
BUN: 18
CO2: 26
Calcium: 9.2
Chloride: 103
Creatinine, Ser: 1.66 — ABNORMAL HIGH
GFR calc Af Amer: 55 — ABNORMAL LOW
GFR calc non Af Amer: 45 — ABNORMAL LOW
Glucose, Bld: 97
Potassium: 4.2
Sodium: 141

## 2011-05-14 LAB — CBC
HCT: 38.1 — ABNORMAL LOW
Hemoglobin: 12.7 — ABNORMAL LOW
MCHC: 33.3
MCV: 73.2 — ABNORMAL LOW
Platelets: 131 — ABNORMAL LOW
RBC: 5.21
RDW: 16 — ABNORMAL HIGH
WBC: 8.1

## 2011-05-14 LAB — APTT: aPTT: 28

## 2011-05-14 LAB — PROTIME-INR
INR: 1.1
Prothrombin Time: 14.1

## 2011-06-02 LAB — CBC
HCT: 36.3 — ABNORMAL LOW
Hemoglobin: 11.6 — ABNORMAL LOW
MCHC: 31.9
MCV: 72.8 — ABNORMAL LOW
Platelets: 154
RBC: 4.99
RDW: 17.1 — ABNORMAL HIGH
WBC: 6.7

## 2011-06-02 LAB — BASIC METABOLIC PANEL
BUN: 15
CO2: 28
Calcium: 9.2
Chloride: 108
Creatinine, Ser: 1.49
GFR calc Af Amer: >60
GFR calc non Af Amer: 51 — ABNORMAL LOW
Glucose, Bld: 104 — ABNORMAL HIGH
Potassium: 4
Sodium: 142

## 2011-06-02 LAB — APTT: aPTT: 29

## 2011-06-02 LAB — PROTIME-INR
INR: 1.1
Prothrombin Time: 14.2

## 2011-07-30 ENCOUNTER — Emergency Department (HOSPITAL_COMMUNITY)
Admission: EM | Admit: 2011-07-30 | Discharge: 2011-07-31 | Disposition: A | Discharge status: Home / Self Care | Payer: Self-pay | Attending: Emergency Medicine | Admitting: Emergency Medicine

## 2011-07-30 ENCOUNTER — Encounter: Payer: Self-pay | Admitting: *Deleted

## 2011-07-30 DIAGNOSIS — R0602 Shortness of breath: Secondary | ICD-10-CM | POA: Insufficient documentation

## 2011-07-30 DIAGNOSIS — R079 Chest pain, unspecified: Secondary | ICD-10-CM | POA: Insufficient documentation

## 2011-07-30 DIAGNOSIS — R109 Unspecified abdominal pain: Secondary | ICD-10-CM | POA: Insufficient documentation

## 2011-07-30 HISTORY — DX: Cerebral aneurysm, nonruptured: I67.1

## 2011-07-30 LAB — DIFFERENTIAL
Basophils Absolute: 0 10*3/uL (ref 0.0–0.1)
Basophils Relative: 0 % (ref 0–1)
Eosinophils Absolute: 0.1 10*3/uL (ref 0.0–0.7)
Eosinophils Relative: 0 % (ref 0–5)
Lymphocytes Relative: 19 % (ref 12–46)
Lymphs Abs: 2.7 10*3/uL (ref 0.7–4.0)
Monocytes Absolute: 1 10*3/uL (ref 0.1–1.0)
Monocytes Relative: 7 % (ref 3–12)
Neutro Abs: 10 10*3/uL — ABNORMAL HIGH (ref 1.7–7.7)
Neutrophils Relative %: 73 % (ref 43–77)

## 2011-07-30 LAB — CBC
HCT: 47 % (ref 39.0–52.0)
Hemoglobin: 15.3 g/dL (ref 13.0–17.0)
MCH: 24.2 pg — ABNORMAL LOW (ref 26.0–34.0)
MCHC: 32.6 g/dL (ref 30.0–36.0)
MCV: 74.2 fL — ABNORMAL LOW (ref 78.0–100.0)
Platelets: 178 10*3/uL (ref 150–400)
RBC: 6.33 MIL/uL — ABNORMAL HIGH (ref 4.22–5.81)
RDW: 15.5 % (ref 11.5–15.5)
WBC: 13.8 10*3/uL — ABNORMAL HIGH (ref 4.0–10.5)

## 2011-07-30 NOTE — ED Notes (Signed)
Pt in c/o intermittent right flank and RUQ pain x2 weeks, states he has taken reflux medication at home with relief but pain returned today, denies N/V

## 2011-07-31 ENCOUNTER — Emergency Department (HOSPITAL_COMMUNITY): Payer: Self-pay

## 2011-07-31 LAB — URINE MICROSCOPIC-ADD ON

## 2011-07-31 LAB — COMPREHENSIVE METABOLIC PANEL
ALT: 11 U/L (ref 0–53)
AST: 12 U/L (ref 0–37)
Albumin: 2.9 g/dL — ABNORMAL LOW (ref 3.5–5.2)
Alkaline Phosphatase: 113 U/L (ref 39–117)
BUN: 22 mg/dL (ref 6–23)
CO2: 26 mEq/L (ref 19–32)
Calcium: 10.7 mg/dL — ABNORMAL HIGH (ref 8.4–10.5)
Chloride: 101 mEq/L (ref 96–112)
Creatinine, Ser: 1.9 mg/dL — ABNORMAL HIGH (ref 0.50–1.35)
GFR calc Af Amer: 47 mL/min — ABNORMAL LOW (ref >90)
GFR calc non Af Amer: 40 mL/min — ABNORMAL LOW (ref >90)
Glucose, Bld: 91 mg/dL (ref 70–99)
Potassium: 4.4 mEq/L (ref 3.5–5.1)
Sodium: 136 mEq/L (ref 135–145)
Total Bilirubin: 0.2 mg/dL — ABNORMAL LOW (ref 0.3–1.2)
Total Protein: 7.8 g/dL (ref 6.0–8.3)

## 2011-07-31 LAB — URINALYSIS, ROUTINE W REFLEX MICROSCOPIC
Bilirubin Urine: NEGATIVE
Glucose, UA: NEGATIVE mg/dL
Ketones, ur: NEGATIVE mg/dL
Leukocytes, UA: NEGATIVE
Nitrite: NEGATIVE
Protein, ur: >300 mg/dL — AB
Specific Gravity, Urine: 1.026 (ref 1.005–1.030)
Urobilinogen, UA: 0.2 mg/dL (ref 0.0–1.0)
pH: 5.5 (ref 5.0–8.0)

## 2011-07-31 MED ORDER — HYDROCODONE-ACETAMINOPHEN 5-325 MG PO TABS
1.0000 | ORAL_TABLET | Freq: Once | ORAL | Status: AC
  Administered 2011-07-31: 1 via ORAL
  Filled 2011-07-31: qty 1

## 2011-07-31 MED ORDER — HYDROCODONE-ACETAMINOPHEN 5-325 MG PO TABS: 1.0000 | ORAL_TABLET | Freq: Four times a day (QID) | ORAL | Status: AC | PRN

## 2011-07-31 NOTE — ED Notes (Signed)
Patient transported to X-ray 

## 2011-07-31 NOTE — ED Provider Notes (Signed)
History     CSN: [id number] Arrival date & time: 07/30/2011  7:23 PM   First MD Initiated Contact with Patient 07/31/11 0020      Chief Complaint  Patient presents with  . Flank Pain    (Consider location/radiation/quality/duration/timing/severity/associated sxs/prior treatment) HPI This is a 45 year old black male with a history of a vague abdominal pain for 2 weeks that sometimes is in the left upper quadrant and sometimes is in the right lower quadrant. He is here with right upper quadrant pain that began yesterday morning about 3 AM. Awoke him from sleep. It is moderate in severity. It is worse lying supine and when taking deep breaths or coughing. He states he is somewhat short of breath. He states he is occasionally nauseated but never vomits. He has not had diarrhea or fever. It has not improved with over-the-counter Alka-Seltzer. It does not change with food. It is constant.  Past Medical History  Diagnosis Date  . Brain aneurysm     History reviewed. No pertinent past surgical history.  History reviewed. No pertinent family history.  History  Substance Use Topics  . Smoking status: Current Everyday Smoker  . Smokeless tobacco: Not on file  . Alcohol Use: No      Review of Systems  All other systems reviewed and are negative.    Allergies  Review of patient's allergies indicates no known allergies.  Home Medications   Current Outpatient Rx  Name Route Sig Dispense Refill  . ALLOPURINOL 300 MG PO TABS Oral Take 300 mg by mouth daily.      [name] CLONIDINE HCL 0.1 MG PO TABS Oral Take 0.1 mg by mouth 2 (two) times daily.      . COLCHICINE 0.6 MG PO TABS Oral Take 0.6 mg by mouth daily.      . INDOMETHACIN 50 MG PO CAPS Oral Take 50 mg by mouth 3 (three) times daily with meals.      . NEBIVOLOL HCL 5 MG PO TABS Oral Take 5 mg by mouth daily.        BP 182/104  Pulse 55  Temp(Src) 98.2 F (36.8 C) (Oral)  Resp 20  SpO2 96%  Physical Exam General:  Well-developed, well-nourished male in no acute distress; appearance consistent with age of record HENT: normocephalic, atraumatic Eyes: Normal appearance Neck: supple Heart: regular rate and rhythm; no murmurs, rubs or gallops Lungs: Decreased air movement bilaterally without frank wheezing Abdomen: soft; right upper quadrant tenderness when supine but not when sitting upright; nondistended; bowel sounds present Extremities: No deformity; full range of motion Neurologic: Awake, alert and oriented; motor function intact in all extremities and symmetric; no facial droop Skin: Warm and dry Psychiatric: Normal mood and affect    ED Course  Procedures (including critical care time)   MDM   Nursing notes and vitals signs, including pulse oximetry, reviewed.  Summary of this visit's results, reviewed by myself:  Labs:  Results for orders placed during the hospital encounter of 07/30/11  CBC      Component Value Range   WBC 13.8 (*) 4.0 - 10.5 (K/uL)   RBC 6.33 (*) 4.22 - 5.81 (MIL/uL)   Hemoglobin 15.3  13.0 - 17.0 (g/dL)   HCT 47.0  39.0 - 52.0 (%)   MCV 74.2 (*) 78.0 - 100.0 (fL)   MCH 24.2 (*) 26.0 - 34.0 (pg)   MCHC 32.6  30.0 - 36.0 (g/dL)   RDW 15.5  11.5 - 15.5 (%)   Platelets  178  150 - 400 (K/uL)  DIFFERENTIAL      Component Value Range   Neutrophils Relative 73  43 - 77 (%)   Neutro Abs 10.0 (*) 1.7 - 7.7 (K/uL)   Lymphocytes Relative 19  12 - 46 (%)   Lymphs Abs 2.7  0.7 - 4.0 (K/uL)   Monocytes Relative 7  3 - 12 (%)   Monocytes Absolute 1.0  0.1 - 1.0 (K/uL)   Eosinophils Relative 0  0 - 5 (%)   Eosinophils Absolute 0.1  0.0 - 0.7 (K/uL)   Basophils Relative 0  0 - 1 (%)   Basophils Absolute 0.0  0.0 - 0.1 (K/uL)  COMPREHENSIVE METABOLIC PANEL      Component Value Range   Sodium 136  135 - 145 (mEq/L)   Potassium 4.4  3.5 - 5.1 (mEq/L)   Chloride 101  96 - 112 (mEq/L)   CO2 26  19 - 32 (mEq/L)   Glucose, Bld 91  70 - 99 (mg/dL)   BUN 22  6 - 23 (mg/dL)    Creatinine, Ser 1.90 (*) 0.50 - 1.35 (mg/dL)   Calcium 10.7 (*) 8.4 - 10.5 (mg/dL)   Total Protein 7.8  6.0 - 8.3 (g/dL)   Albumin 2.9 (*) 3.5 - 5.2 (g/dL)   AST 12  0 - 37 (U/L)   ALT 11  0 - 53 (U/L)   Alkaline Phosphatase 113  39 - 117 (U/L)   Total Bilirubin 0.2 (*) 0.3 - 1.2 (mg/dL)   GFR calc non Af Amer 40 (*) >90 (mL/min)   GFR calc Af Amer 47 (*) >90 (mL/min)   Dg Chest 2 View  07/31/2011  *RADIOLOGY REPORT*  Clinical Data: Shortness of breath and upper chest pain.  CHEST - 2 VIEW  Comparison: 11/12/2006  Findings: Shallow inspiration.  Infiltration or atelectasis in both lung bases, slightly greater on the right.  Suggestion of mild blunting of the costophrenic angles which might represent small effusions.  Early pneumonia is not excluded.  Borderline heart size with normal pulmonary vascularity, given the technique.  Tortuous and ectatic aorta.  No pneumothorax.  IMPRESSION: Atelectasis or infiltration in both lung bases with suggestion of small pleural effusions.  Original Report Authenticated By: Neale Burly, M.D.   US Abdomen Complete  07/31/2011  *RADIOLOGY REPORT*  Clinical Data:  Right upper quadrant pain.  COMPLETE ABDOMINAL ULTRASOUND  Comparison:  CT 05/19/2010  Findings:  Gallbladder:  The gallbladder is somewhat contracted.  This is nonspecific and might be due to nonfasting state, although the patient reports being fasting.  There is no gallbladder wall thickening.  No stones are demonstrated.  No pericholecystic edema.  Common bile duct:  Normal caliber.  Diameter measured at 5 mm.  Liver:  Diffusely  coarsened appearance of the liver parenchymal echotexture which might represent infiltrative changes such as cirrhosis or fatty infiltration.  Correlation with liver function studies is recommended.  Suggestion of mild central bile duct dilatation.  IVC:  Appears normal.  Pancreas:  Homogeneous appearance of parenchymal echotexture.  Spleen:  Spleen length measures 8.1  cm.  Homogeneous parenchymal echotexture.  Right Kidney:  Right kidney measures 10 cm length.  No hydronephrosis.  Left Kidney:  Left kidney measures 10.5 cm length.  No hydronephrosis.  Abdominal aorta:  Limited visualization of the abdominal aorta. Visualized segments are normal in caliber.  IMPRESSION: Nonspecific gallbladder contraction without wall thickening or stone.  Suggestion of mild intrahepatic bile duct dilatation with a  coarsened liver echotexture.  Correlation with liver function studies is recommended.  Original Report Authenticated By: Neale Burly, M.D.   3:37 AM Patient denies fevers or other symptoms of acute illness. He was advised of his lab and x-ray findings and need for followup with his primary care physician for further evaluation.          Wynetta Fines, MD 07/31/11 [phone]

## 2011-07-31 NOTE — ED Notes (Signed)
Pt c/o flank pain reports that it first started on the left side now it is in the right side reports that the pain is worse if he coughs and if he lays down also reports that while laying down he becomes Shob. Pt given a ua cup to provide a specimen reports that he cannot urinate at this time.

## 2011-07-31 NOTE — ED Notes (Signed)
Patient returned from X-ray 

## 2013-11-23 ENCOUNTER — Encounter (HOSPITAL_COMMUNITY): Payer: Self-pay | Admitting: Emergency Medicine

## 2013-11-23 ENCOUNTER — Emergency Department (HOSPITAL_COMMUNITY)
Admission: EM | Admit: 2013-11-23 | Discharge: 2013-11-23 | Disposition: A | Discharge status: Home / Self Care | Payer: Medicare Other | Attending: Emergency Medicine | Admitting: Emergency Medicine

## 2013-11-23 DIAGNOSIS — G8929 Other chronic pain: Secondary | ICD-10-CM | POA: Insufficient documentation

## 2013-11-23 DIAGNOSIS — I10 Essential (primary) hypertension: Secondary | ICD-10-CM

## 2013-11-23 DIAGNOSIS — F17200 Nicotine dependence, unspecified, uncomplicated: Secondary | ICD-10-CM | POA: Insufficient documentation

## 2013-11-23 DIAGNOSIS — Z760 Encounter for issue of repeat prescription: Secondary | ICD-10-CM | POA: Insufficient documentation

## 2013-11-23 DIAGNOSIS — Z79899 Other long term (current) drug therapy: Secondary | ICD-10-CM | POA: Insufficient documentation

## 2013-11-23 MED ORDER — CLONIDINE HCL 0.1 MG PO TABS: 0.1000 mg | ORAL_TABLET | Freq: Two times a day (BID) | ORAL | Status: DC

## 2013-11-23 MED ORDER — NEBIVOLOL HCL 5 MG PO TABS: 5.0000 mg | ORAL_TABLET | Freq: Every day | ORAL | Status: DC

## 2013-11-23 NOTE — ED Provider Notes (Signed)
CSN: [id number]     Arrival date & time 11/23/13  1038 History   First MD Initiated Contact with Patient 11/23/13 1138     Chief Complaint  Patient presents with  . Hypertension  . Medication Refill     (Consider location/radiation/quality/duration/timing/severity/associated sxs/prior Treatment) HPI  48 year old male with history of brain aneurysm and history of hypertension who presents requesting for medication refill. Patient states he has not taken his hypertension medication for the past year due to financial reasons. He also endorsed chronic headache for the past 5 months. No active headache at this time. States he drinks of water and vinegar "to help with headache". Currently denies having any fever, chills, double vision, neck pain, chest pain, shortness of breath, abdominal pain, back pain, numbness or weakness.  He is here because he finally got his medicaid card, and have a doctor assigned to him but would not be able to set an appointment for him until next week.    Past Medical History  Diagnosis Date  . Brain aneurysm    No past surgical history on file. No family history on file. History  Substance Use Topics  . Smoking status: Current Every Day Smoker  . Smokeless tobacco: Not on file  . Alcohol Use: No    Review of Systems  All other systems reviewed and are negative.     Allergies  Review of patient's allergies indicates no known allergies.  Home Medications   Current Outpatient Rx  Name  Route  Sig  Dispense  Refill  . allopurinol (ZYLOPRIM) 300 MG tablet   Oral   Take 300 mg by mouth daily.           . cloNIDine (CATAPRES) 0.1 MG tablet   Oral   Take 0.1 mg by mouth 2 (two) times daily.           . colchicine 0.6 MG tablet   Oral   Take 0.6 mg by mouth daily.           . indomethacin (INDOCIN) 50 MG capsule   Oral   Take 50 mg by mouth 3 (three) times daily with meals.           . nebivolol (BYSTOLIC) 5 MG tablet   Oral   Take 5 mg  by mouth daily.            BP 188/119  Pulse 71  Temp(Src) 99.5 F (37.5 C) (Oral)  Resp 16  SpO2 99% Physical Exam  Nursing note and vitals reviewed. Constitutional: He is oriented to person, place, and time. He appears well-developed and well-nourished. No distress.  Awake, alert, nontoxic appearance  HENT:  Head: Atraumatic.  Eyes: Conjunctivae are normal. Right eye exhibits no discharge. Left eye exhibits no discharge.  Neck: Normal range of motion. Neck supple.  No nuchal rigidity  Cardiovascular: Normal rate and regular rhythm.   Pulmonary/Chest: Effort normal. No respiratory distress. He exhibits no tenderness.  Abdominal: Soft. There is no tenderness. There is no rebound.  Musculoskeletal: He exhibits no tenderness.  ROM appears intact, no obvious focal weakness  Neurological: He is alert and oriented to person, place, and time.  Skin: Skin is warm and dry. No rash noted.  Psychiatric: He has a normal mood and affect.    ED Course  Procedures (including critical care time)  11:40 AM Pt is here for medication refill of his HTN med.  He is hypertensive at 188/119, this is likely chronic in nature.  No other complaints at this time concerning for end organ damage.  Will prescribe clonidine 0.1mg  tab, and bystolic 5mg  tab that was previously prescribed to him.  Strongly urge pt to f/u with PCP for closer management of his medical condition.  Recommend DASH diet along with exercise.  Return precaution discussed.    Labs Review Labs Reviewed - No data to display Imaging Review No results found.   EKG Interpretation None      MDM   Final diagnoses:  HTN (hypertension)  Encounter for medication refill    BP 188/119  Pulse 71  Temp(Src) 99.5 F (37.5 C) (Oral)  Resp 16  SpO2 99%     Domenic Moras, PA-C 11/23/13 1148

## 2013-11-23 NOTE — Discharge Instructions (Signed)
Hypertension As your heart beats, it forces blood through your arteries. This force is your blood pressure. If the pressure is too high, it is called hypertension (HTN) or high blood pressure. HTN is dangerous because you may have it and not know it. High blood pressure may mean that your heart has to work harder to pump blood. Your arteries may be narrow or stiff. The extra work puts you at risk for heart disease, stroke, and other problems.  Blood pressure consists of two numbers, a higher number over a lower, 110/72, for example. It is stated as "110 over 72." The ideal is below 120 for the top number (systolic) and under 80 for the bottom (diastolic). Write down your blood pressure today. You should pay close attention to your blood pressure if you have certain conditions such as:  Heart failure.  Prior heart attack.  Diabetes  Chronic kidney disease.  Prior stroke.  Multiple risk factors for heart disease. To see if you have HTN, your blood pressure should be measured while you are seated with your arm held at the level of the heart. It should be measured at least twice. A one-time elevated blood pressure reading (especially in the Emergency Department) does not mean that you need treatment. There may be conditions in which the blood pressure is different between your right and left arms. It is important to see your caregiver soon for a recheck. Most people have essential hypertension which means that there is not a specific cause. This type of high blood pressure may be lowered by changing lifestyle factors such as:  Stress.  Smoking.  Lack of exercise.  Excessive weight.  Drug/tobacco/alcohol use.  Eating less salt. Most people do not have symptoms from high blood pressure until it has caused damage to the body. Effective treatment can often prevent, delay or reduce that damage. TREATMENT  When a cause has been identified, treatment for high blood pressure is directed at the  cause. There are a large number of medications to treat HTN. These fall into several categories, and your caregiver will help you select the medicines that are best for you. Medications may have side effects. You should review side effects with your caregiver. If your blood pressure stays high after you have made lifestyle changes or started on medicines,   Your medication(s) may need to be changed.  Other problems may need to be addressed.  Be certain you understand your prescriptions, and know how and when to take your medicine.  Be sure to follow up with your caregiver within the time frame advised (usually within two weeks) to have your blood pressure rechecked and to review your medications.  If you are taking more than one medicine to lower your blood pressure, make sure you know how and at what times they should be taken. Taking two medicines at the same time can result in blood pressure that is too low. SEEK IMMEDIATE MEDICAL CARE IF:  You develop a severe headache, blurred or changing vision, or confusion.  You have unusual weakness or numbness, or a faint feeling.  You have severe chest or abdominal pain, vomiting, or breathing problems. MAKE SURE YOU:   Understand these instructions.  Will watch your condition.  Will get help right away if you are not doing well or get worse. Document Released: 08/04/2005 Document Revised: 10/27/2011 Document Reviewed: 03/24/2008 [facility] Patient Information 2014 [place].  DASH Diet The DASH diet stands for "Dietary Approaches to Stop Hypertension." It is a  healthy eating plan that has been shown to reduce high blood pressure (hypertension) in as little as 14 days, while also possibly providing other significant health benefits. These other health benefits include reducing the risk of breast cancer after menopause and reducing the risk of type 2 diabetes, heart disease, colon cancer, and stroke. Health benefits also include weight  loss and slowing kidney failure in patients with chronic kidney disease.  DIET GUIDELINES  Limit salt (sodium). Your diet should contain less than 1500 mg of sodium daily.  Limit refined or processed carbohydrates. Your diet should include mostly whole grains. Desserts and added sugars should be used sparingly.  Include small amounts of heart-healthy fats. These types of fats include nuts, oils, and tub margarine. Limit saturated and trans fats. These fats have been shown to be harmful in the body. CHOOSING FOODS  The following food groups are based on a 2000 calorie diet. See your Registered Dietitian for individual calorie needs. Grains and Grain Products (6 to 8 servings daily)  Eat More Often: Whole-wheat bread, brown rice, whole-grain or wheat pasta, quinoa, popcorn without added fat or salt (air popped).  Eat Less Often: White bread, white pasta, white rice, cornbread. Vegetables (4 to 5 servings daily)  Eat More Often: Fresh, frozen, and canned vegetables. Vegetables may be raw, steamed, roasted, or grilled with a minimal amount of fat.  Eat Less Often/Avoid: Creamed or fried vegetables. Vegetables in a cheese sauce. Fruit (4 to 5 servings daily)  Eat More Often: All fresh, canned (in natural juice), or frozen fruits. Dried fruits without added sugar. One hundred percent fruit juice ( cup [237 mL] daily).  Eat Less Often: Dried fruits with added sugar. Canned fruit in light or heavy syrup. [employer], Fish, and Poultry (2 servings or less daily. One serving is 3 to 4 oz [85-114 g]).  Eat More Often: Ninety percent or leaner ground beef, tenderloin, sirloin. Round cuts of beef, chicken breast, [place] breast. All fish. Grill, bake, or broil your meat. Nothing should be fried.  Eat Less Often/Avoid: Fatty cuts of meat, [place], or chicken leg, thigh, or wing. Fried cuts of meat or fish. Dairy (2 to 3 servings)  Eat More Often: Low-fat or fat-free milk, low-fat plain or light  yogurt, reduced-fat or part-skim cheese.  Eat Less Often/Avoid: Milk (whole, 2%).Whole milk yogurt. Full-fat cheeses. Nuts, Seeds, and Legumes (4 to 5 servings per week)  Eat More Often: All without added salt.  Eat Less Often/Avoid: Salted nuts and seeds, canned beans with added salt. Fats and Sweets (limited)  Eat More Often: Vegetable oils, tub margarines without trans fats, sugar-free gelatin. Mayonnaise and salad dressings.  Eat Less Often/Avoid: Coconut oils, palm oils, butter, stick margarine, cream, half and half, cookies, candy, pie. FOR MORE INFORMATION The Dash Diet Eating Plan: www.dashdiet.org Document Released: 07/24/2011 Document Revised: 10/27/2011 Document Reviewed: 07/24/2011 [facility] Patient Information 2014 [place], [place].  DASH Diet The DASH diet stands for "Dietary Approaches to Stop Hypertension." It is a healthy eating plan that has been shown to reduce high blood pressure (hypertension) in as little as 14 days, while also possibly providing other significant health benefits. These other health benefits include reducing the risk of breast cancer after menopause and reducing the risk of type 2 diabetes, heart disease, colon cancer, and stroke. Health benefits also include weight loss and slowing kidney failure in patients with chronic kidney disease.  DIET GUIDELINES  Limit salt (sodium). Your diet should contain less than 1500 mg of  sodium daily.  Limit refined or processed carbohydrates. Your diet should include mostly whole grains. Desserts and added sugars should be used sparingly.  Include small amounts of heart-healthy fats. These types of fats include nuts, oils, and tub margarine. Limit saturated and trans fats. These fats have been shown to be harmful in the body. CHOOSING FOODS  The following food groups are based on a 2000 calorie diet. See your Registered Dietitian for individual calorie needs. Grains and Grain Products (6 to 8 servings daily)  Eat  More Often: Whole-wheat bread, brown rice, whole-grain or wheat pasta, quinoa, popcorn without added fat or salt (air popped).  Eat Less Often: White bread, white pasta, white rice, cornbread. Vegetables (4 to 5 servings daily)  Eat More Often: Fresh, frozen, and canned vegetables. Vegetables may be raw, steamed, roasted, or grilled with a minimal amount of fat.  Eat Less Often/Avoid: Creamed or fried vegetables. Vegetables in a cheese sauce. Fruit (4 to 5 servings daily)  Eat More Often: All fresh, canned (in natural juice), or frozen fruits. Dried fruits without added sugar. One hundred percent fruit juice ( cup [237 mL] daily).  Eat Less Often: Dried fruits with added sugar. Canned fruit in light or heavy syrup. [employer], Fish, and Poultry (2 servings or less daily. One serving is 3 to 4 oz [85-114 g]).  Eat More Often: Ninety percent or leaner ground beef, tenderloin, sirloin. Round cuts of beef, chicken breast, [place] breast. All fish. Grill, bake, or broil your meat. Nothing should be fried.  Eat Less Often/Avoid: Fatty cuts of meat, [place], or chicken leg, thigh, or wing. Fried cuts of meat or fish. Dairy (2 to 3 servings)  Eat More Often: Low-fat or fat-free milk, low-fat plain or light yogurt, reduced-fat or part-skim cheese.  Eat Less Often/Avoid: Milk (whole, 2%).Whole milk yogurt. Full-fat cheeses. Nuts, Seeds, and Legumes (4 to 5 servings per week)  Eat More Often: All without added salt.  Eat Less Often/Avoid: Salted nuts and seeds, canned beans with added salt. Fats and Sweets (limited)  Eat More Often: Vegetable oils, tub margarines without trans fats, sugar-free gelatin. Mayonnaise and salad dressings.  Eat Less Often/Avoid: Coconut oils, palm oils, butter, stick margarine, cream, half and half, cookies, candy, pie. FOR MORE INFORMATION The Dash Diet Eating Plan: www.dashdiet.org Document Released: 07/24/2011 Document Revised: 10/27/2011 Document Reviewed:  07/24/2011 [facility] Patient Information 2014 [place], [place].

## 2013-11-23 NOTE — ED Notes (Addendum)
Pt states that he has not been taking his HTN meds x 1 yr due to financial reasons.  States he has been having headaches x 9 months.  Drinks "water and vinegar" for them.  Not currently having a headache.

## 2013-11-24 NOTE — ED Provider Notes (Signed)
Medical screening examination/treatment/procedure(s) were performed by non-physician practitioner and as supervising physician I was immediately available for consultation/collaboration.   EKG Interpretation None        Ephraim Hamburger, MD 11/24/13 [phone]

## 2013-12-23 ENCOUNTER — Other Ambulatory Visit: Payer: Self-pay | Admitting: Nephrology

## 2013-12-23 DIAGNOSIS — N184 Chronic kidney disease, stage 4 (severe): Secondary | ICD-10-CM

## 2013-12-23 DIAGNOSIS — I10 Essential (primary) hypertension: Secondary | ICD-10-CM

## 2013-12-27 ENCOUNTER — Ambulatory Visit (INDEPENDENT_AMBULATORY_CARE_PROVIDER_SITE_OTHER): Payer: Medicare Other | Admitting: Cardiology

## 2013-12-27 ENCOUNTER — Encounter: Payer: Self-pay | Admitting: Cardiology

## 2013-12-27 VITALS — BP 138/96 | HR 66 | Ht 72.0 in | Wt 253.0 lb

## 2013-12-27 DIAGNOSIS — Z9119 Patient's noncompliance with other medical treatment and regimen: Secondary | ICD-10-CM | POA: Insufficient documentation

## 2013-12-27 DIAGNOSIS — N184 Chronic kidney disease, stage 4 (severe): Secondary | ICD-10-CM | POA: Insufficient documentation

## 2013-12-27 DIAGNOSIS — N189 Chronic kidney disease, unspecified: Secondary | ICD-10-CM

## 2013-12-27 DIAGNOSIS — I10 Essential (primary) hypertension: Secondary | ICD-10-CM | POA: Insufficient documentation

## 2013-12-27 DIAGNOSIS — R079 Chest pain, unspecified: Secondary | ICD-10-CM

## 2013-12-27 DIAGNOSIS — Z91199 Patient's noncompliance with other medical treatment and regimen due to unspecified reason: Secondary | ICD-10-CM | POA: Insufficient documentation

## 2013-12-27 DIAGNOSIS — I7100 Dissection of unspecified site of aorta: Secondary | ICD-10-CM

## 2013-12-27 MED ORDER — CLONIDINE HCL 0.2 MG PO TABS: 0.2000 mg | ORAL_TABLET | Freq: Two times a day (BID) | ORAL | Status: DC

## 2013-12-27 NOTE — Progress Notes (Signed)
[place], [place], [place]  [address] Phone: [phone] Fax:  [phone]  Date:  12/27/2013   ID:  [name] Downs, DOB Mar 21, 1966, MRN [id number] PCP:  No primary provider on file.  Cardiologist:  Fransico Him, MD    History of Present Illness: [name] Downs is a 48 y.o. male with a history of Type B aortic dissection, HTN, subarachnoid hemorrhage with right sided hemiparesis and tobacco abuse who was followed by Dr. Percival Spanish but and was last seen by him in 2009 who presents today for evaluation of chest pain and SOB.  He recently saw his PCP for med refill.  He had not taken his BP meds in over a year due to financial reasons.  He recently got a medicaid card and got refills on his meds.  His BP at his last OV on 11/23/13 was 188/168mmHg.  He says that his SOB and chest pain have been going on for about a year.  The chest pain occurs once a week lasting anywhere from 10 to 20 minutes and resolves with rest.  It is located in the midsternal region with no radiation, nausea or diaphoresis.  He takes rolaids that does not help.  The SOB occurs with exertion but not when he gets the CP.  He denies palpitations or syncope.     Wt Readings from Last 3 Encounters:  12/27/13 253 lb (114.76 kg)     Past Medical History  Diagnosis Date  . Brain aneurysm     Current Outpatient Prescriptions  Medication Sig Dispense Refill  . allopurinol (ZYLOPRIM) 300 MG tablet Take 300 mg by mouth daily.        . cloNIDine (CATAPRES) 0.1 MG tablet Take 1 tablet (0.1 mg total) by mouth 2 (two) times daily.  60 tablet  0  . colchicine 0.6 MG tablet Take 0.6 mg by mouth daily.        . indomethacin (INDOCIN) 50 MG capsule Take 50 mg by mouth 3 (three) times daily with meals.        . nebivolol (BYSTOLIC) 5 MG tablet Take 1 tablet (5 mg total) by mouth daily.  30 tablet  0   No current facility-administered medications for this visit.    Allergies:   No Known Allergies  Social History:  The patient   reports that he has been smoking.  He does not have any smokeless tobacco history on file. He reports that he does not drink alcohol or use illicit drugs.   Family History:  The patient's family history is not on file.   ROS:  Please see the history of present illness.      All other systems reviewed and negative.   PHYSICAL EXAM: VS:  BP 138/96  Pulse 66  Ht 6' (1.829 m)  Wt 253 lb (114.76 kg)  BMI 34.31 kg/m2 Well nourished, well developed, in no acute distress HEENT: normal Neck: no JVD Cardiac:  normal S1, S2; RRR; no murmur Lungs:  clear to auscultation bilaterally, no wheezing, rhonchi or rales Abd: soft, nontender, no hepatomegaly Ext: no edema Skin: warm and dry Neuro:  CNs 2-12 intact, no focal abnormalities noted  EKG:  NSR at 66bpm woith LVH by voltage and repol abnormality - no EKG to compare  ASSESSMENT AND PLAN:  1. Chest pain with CRF including poorly controlled HTN, ongoing tobacco use and family history of MI.  - Lexiscan myoview to rule out ischemia - 2D echo to assess LVF  2.  HTN poorly controlled diastolic BP.  He needs aggressive BP control given his history of descending aortic dissection in the past. HR is in the mid 60's so will not increase his beta blocker any further.   - increase Clonidine to 0.2mg  BID for better BP control - continue Bystolic 3. Chronic type B aortic dissection - check BMET and if renal function ok then  I will get a chest and Abd CT scan to make sure this is stable since his BP has been poorly controlled and he has been having CP 4.  Chronic renal insuff - he has not had a creatine checked since 12/12 at which time his creatinine was 1.9 - check BMET today  Followup with PA in 2 weeks for BP check and with me in 4 weeks   Signed, Fransico Him, MD 12/27/2013 4:54 PM

## 2013-12-27 NOTE — Patient Instructions (Addendum)
INCREASE YOUR CLONIDINE TO 0.2 MG TWICE A DAY   Your physician has requested that you have a lexiscan myoview. For further information please visit [email or web address]. Please follow instruction sheet, as given.  Your physician has requested that you have an echocardiogram. Echocardiography is a painless test that uses sound waves to create images of your heart. It provides your doctor with information about the size and shape of your heart and how well your heart's chambers and valves are working. This procedure takes approximately one hour. There are no restrictions for this procedure.  Your physician recommends that you schedule a follow-up appointment in: [place]  Your physician recommends that you schedule a follow-up appointment in: [place]

## 2013-12-28 ENCOUNTER — Other Ambulatory Visit: Payer: Self-pay | Admitting: *Deleted

## 2013-12-28 ENCOUNTER — Ambulatory Visit
Admission: RE | Admit: 2013-12-28 | Discharge: 2013-12-28 | Disposition: A | Discharge status: Home / Self Care | Payer: Medicare Other | Source: Ambulatory Visit | Attending: Nephrology | Admitting: Nephrology

## 2013-12-28 DIAGNOSIS — I10 Essential (primary) hypertension: Secondary | ICD-10-CM

## 2013-12-28 DIAGNOSIS — I7100 Dissection of unspecified site of aorta: Secondary | ICD-10-CM

## 2013-12-28 DIAGNOSIS — N184 Chronic kidney disease, stage 4 (severe): Secondary | ICD-10-CM

## 2013-12-28 LAB — BASIC METABOLIC PANEL
BUN: 38 mg/dL — ABNORMAL HIGH (ref 6–23)
CO2: 24 mEq/L (ref 19–32)
Calcium: 9.3 mg/dL (ref 8.4–10.5)
Chloride: 109 mEq/L (ref 96–112)
Creatinine, Ser: 4 mg/dL — ABNORMAL HIGH (ref 0.4–1.5)
GFR: 20.72 mL/min — ABNORMAL LOW (ref >60.00)
Glucose, Bld: 86 mg/dL (ref 70–99)
Potassium: 4.7 mEq/L (ref 3.5–5.1)
Sodium: 139 mEq/L (ref 135–145)

## 2014-01-04 ENCOUNTER — Encounter: Payer: Medicare Other | Admitting: Surgery

## 2014-01-11 ENCOUNTER — Ambulatory Visit (INDEPENDENT_AMBULATORY_CARE_PROVIDER_SITE_OTHER)
Admission: RE | Admit: 2014-01-11 | Discharge: 2014-01-11 | Disposition: A | Discharge status: Home / Self Care | Payer: Medicare Other | Source: Ambulatory Visit | Attending: Cardiology | Admitting: Cardiology

## 2014-01-11 ENCOUNTER — Ambulatory Visit (HOSPITAL_COMMUNITY): Payer: Medicare Other | Attending: Cardiovascular Disease | Admitting: Radiology

## 2014-01-11 VITALS — BP 119/80 | Ht 72.0 in | Wt 256.0 lb

## 2014-01-11 DIAGNOSIS — R079 Chest pain, unspecified: Secondary | ICD-10-CM | POA: Insufficient documentation

## 2014-01-11 DIAGNOSIS — R0602 Shortness of breath: Secondary | ICD-10-CM | POA: Insufficient documentation

## 2014-01-11 DIAGNOSIS — R0609 Other forms of dyspnea: Secondary | ICD-10-CM | POA: Insufficient documentation

## 2014-01-11 DIAGNOSIS — I7100 Dissection of unspecified site of aorta: Secondary | ICD-10-CM

## 2014-01-11 DIAGNOSIS — R002 Palpitations: Secondary | ICD-10-CM | POA: Insufficient documentation

## 2014-01-11 DIAGNOSIS — R0989 Other specified symptoms and signs involving the circulatory and respiratory systems: Secondary | ICD-10-CM | POA: Insufficient documentation

## 2014-01-11 MED ORDER — TECHNETIUM TC 99M SESTAMIBI GENERIC - CARDIOLITE: 10.8000 | Freq: Once | INTRAVENOUS | Status: DC | PRN

## 2014-01-11 MED ORDER — REGADENOSON 0.4 MG/5ML IV SOLN
0.4000 mg | Freq: Once | INTRAVENOUS | Status: AC
  Administered 2014-01-11: 0.4 mg via INTRAVENOUS

## 2014-01-11 MED ORDER — TECHNETIUM TC 99M SESTAMIBI GENERIC - CARDIOLITE: 33.0000 | Freq: Once | INTRAVENOUS | Status: DC | PRN

## 2014-01-11 NOTE — Progress Notes (Signed)
[name] Downs [address] [place], [place] [address] [phone]    Cardiology Nuclear Med Study  [name] Downs is a 48 y.o. male     MRN : [id number]     DOB: 1965/09/11  Procedure Date: 01/11/2014  Nuclear Med Background Indication for Stress Test:  Evaluation for Ischemia History:  No Hx of CAD 2/06 ECHO: EF: 50-55% 10/08/04 MPI: NL EF: 54%  H/O Aortic Dissection Cardiac Risk Factors: Family History - CAD and Hypertension  Symptoms:  Chest Pain, DOE, Palpitations and SOB   Nuclear Pre-Procedure Caffeine/Decaff Intake:  None NPO After: 7:30pm   Lungs:  clear O2 Sat: 98% on room air. IV 0.9% NS with Angio Cath:  22g  IV Site: R Hand  IV Started by:  Crissie Figures, RN  Chest Size (in):  50 Cup Size: n/a  Height: 6' (1.829 m)  Weight:  256 lb (116.121 kg)  BMI:  Body mass index is 34.71 kg/(m^2). Tech Comments:  N/A    Nuclear Med Study 1 or 2 day study: 1 day  Stress Test Type:  Lexiscan  Reading MD: N/A  Order Authorizing Provider:  Fransico Him, MD  Resting Radionuclide: Technetium 31m Sestamibi  Resting Radionuclide Dose: 11.0 mCi   Stress Radionuclide:  Technetium 70m Sestamibi  Stress Radionuclide Dose: 33.0 mCi           Stress Protocol Rest HR: 57 Stress HR: 78  Rest BP: 119/80 Stress BP: 132/86  Exercise Time (min): n/a METS: n/a   Predicted Max HR: 170 bpm % Max HR: 45.88 bpm Rate Pressure Product: 10296   Dose of Adenosine (mg):  n/a Dose of Lexiscan: 0.4 mg  Dose of Atropine (mg): n/a Dose of Dobutamine: n/a mcg/kg/min (at max HR)  Stress Test Technologist: Perrin Maltese, EMT-P  Nuclear Technologist:  Charlton Amor, CNMT     Rest Procedure:  Myocardial perfusion imaging was performed at rest 45 minutes following the intravenous administration of Technetium 28m Sestamibi. Rest ECG: NSR - Normal EKG  Stress Procedure:  The patient received IV Lexiscan 0.4 mg over 15-seconds.  Technetium 54m Sestamibi injected at  30-seconds. This patient had sob, chest burning, throat itching, and a cough with the Lexiscan injection. Quantitative spect images were obtained after a 45 minute delay. Stress ECG: No significant change from baseline ECG  QPS Raw Data Images:  Normal; no motion artifact; normal heart/lung ratio. Stress Images:  Normal homogeneous uptake in all areas of the myocardium. Rest Images:  Normal homogeneous uptake in all areas of the myocardium. Subtraction (SDS):  No evidence of ischemia. Transient Ischemic Dilatation (Normal <1.22):  1.07 Lung/Heart Ratio (Normal <0.45):  0.33  Quantitative Gated Spect Images QGS EDV:  144 ml  QGS ESV:  72 ml  Impression Exercise Capacity:  Lexiscan with no exercise. BP Response:  Normal blood pressure response. Clinical Symptoms:  Mild chest pain/dyspnea. ECG Impression:  No significant ST segment change suggestive of ischemia. Comparison with Prior Nuclear Study: No images to compare  Overall Impression:  Normal stress nuclear study.  LV Ejection Fraction: 50%.  LV Wall Motion:  NL LV Function; NL Wall Motion  Dorothy Spark 01/11/2014

## 2014-01-12 ENCOUNTER — Telehealth: Payer: Self-pay | Admitting: *Deleted

## 2014-01-12 NOTE — Telephone Encounter (Signed)
Patient states that he does have chest pains "every now and then", states better than before. Patient denies pain worse with exertion more when he sits. Patient not taking blood pressure at home, was told good yesterday. Advised patient to start taking blood pressures daily and call back next week with update.   Notes Recorded by Sueanne Margarita, MD on 01/12/2014 at 9:48 AM Please find out if he has had any further CP and what his BP has been running at home ------  Advised patient of myoview results.

## 2014-01-12 NOTE — Telephone Encounter (Signed)
Message copied by [name] Downs on Thu Jan 12, 2014 10:19 AM ------      Message from: Fransico Him R      Created: Thu Jan 12, 2014  9:48 AM       Please find out if he has had any further CP and what his BP has been running at home ------

## 2014-01-12 NOTE — Telephone Encounter (Signed)
Message copied by Earvin Hansen on Thu Jan 12, 2014 10:18 AM ------      Message from: Fransico Him R      Created: Thu Jan 12, 2014  9:47 AM       Please let patient know that stress test was fine ------

## 2014-01-13 ENCOUNTER — Ambulatory Visit (HOSPITAL_COMMUNITY): Payer: Medicare Other | Attending: Cardiology | Admitting: Radiology

## 2014-01-13 DIAGNOSIS — R079 Chest pain, unspecified: Secondary | ICD-10-CM | POA: Insufficient documentation

## 2014-01-13 DIAGNOSIS — R072 Precordial pain: Secondary | ICD-10-CM

## 2014-01-13 NOTE — Progress Notes (Signed)
Echocardiogram performed.  

## 2014-01-17 ENCOUNTER — Encounter: Payer: Medicare Other | Admitting: Physician Assistant

## 2014-01-17 DIAGNOSIS — Z720 Tobacco use: Secondary | ICD-10-CM | POA: Insufficient documentation

## 2014-01-17 NOTE — Progress Notes (Signed)
This encounter was created in error - please disregard.

## 2014-01-17 NOTE — Progress Notes (Deleted)
Cardiology Office Note   Date:  01/17/2014   ID:  [name] Downs, DOB Jun 05, 1966, MRN [id number]  PCP:  No primary provider on file.  Cardiologist:  Dr. Fransico Him      History of Present Illness: [name] Downs is a 48 y.o. 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  Previously seen by Dr. Minus Breeding in the past.  Recently established with Dr. Fransico Him.   He was evaluated for chest pain and dyspnea.  BP had been poorly controlled due to financial reasons.  He recently got Medicaid.  Clonidine was adjusted.  Echo was done and demonstrated normal LVF, mild diastolic dysfunction, mild LVH.  Myoview was done and showed no ischemia.  Follow up labs demonstrated worsening renal function.  Creatinine (07/2011): 1.90 => Creatinine (12/2013):  4.0.  He has been referred to nephrology.  Renal US demonstrated chronic medical renal disease and no hydronephrosis.  He has also been referred back to Dr. Cyndia Bent.  Follow up CT without contrast has demonstrated evidence of prior dissection in distal transverse aorta.  Extent could not be determined due to lack of IV contrast.  He returns for follow up.  ***   Studies:  - Echo (01/13/14):  Mild LVH, EF 55%, normal wall motion, grade 1 diastolic dysfunction, mild LAE, normal RV function, PASP 22 mm Hg  - Nuclear (01/12/14):  No ischemia, EF 50%; normal study   Renal US (12/28/13): IMPRESSION: 1. No hydronephrosis. 2. Echogenic renal parenchyma consistent with chronic renal medical disease.   Chest CT without Contrast (01/11/14): IMPRESSION:  Dilatation of the distal transverse aorta with intimal calcification in apparent thrombus posterolaterally, consistent with the reported history of known dissection. The extent of the dissection flap cannot be assessed given the lack of intravenous contrast material.  A thin hypo attenuating rim along the  proximal ascending segment of the aorta may be related to thrombus. No mediastinal or pleural fluid.   Recent Labs: 12/27/2013: Creatinine 4.0*; Potassium 4.7   Wt Readings from Last 3 Encounters:  01/11/14 256 lb (116.121 kg)  12/27/13 253 lb (114.76 kg)     Past Medical History  Diagnosis Date  . Brain aneurysm   . Aortic dissection     type B  . Hypertension   . Medically noncompliant     Current Outpatient Prescriptions  Medication Sig Dispense Refill  . allopurinol (ZYLOPRIM) 300 MG tablet Take 300 mg by mouth daily.        . cloNIDine (CATAPRES) 0.2 MG tablet Take 1 tablet (0.2 mg total) by mouth 2 (two) times daily.  60 tablet  1  . colchicine 0.6 MG tablet Take 0.6 mg by mouth daily.        . indomethacin (INDOCIN) 50 MG capsule Take 50 mg by mouth 3 (three) times daily with meals.        . nebivolol (BYSTOLIC) 5 MG tablet Take 1 tablet (5 mg total) by mouth daily.  30 tablet  0   No current facility-administered medications for this visit.    Allergies:   Review of patient's allergies indicates no known allergies.   Social History:  The patient  reports that he has been smoking.  He does not have any smokeless tobacco history on file. He reports that he does not drink alcohol or use illicit drugs.   Family History:  The patient's family history includes Alzheimer's  disease in his mother; Cirrhosis in his father; Heart attack in his brother.   ROS:  Please see the history of present illness.   ***   All other systems reviewed and negative.   PHYSICAL EXAM: VS:  There were no vitals taken for this visit. Well nourished, well developed, in no acute distress HEENT: normal Neck: ***no JVD Cardiac:  normal S1, S2; ***RRR; no murmur Lungs:  ***clear to auscultation bilaterally, no wheezing, rhonchi or rales Abd: soft, nontender, no hepatomegaly Ext: ***no edema Skin: warm and dry Neuro:  CNs 2-12 intact, no focal abnormalities noted  EKG:  ***     ASSESSMENT AND  PLAN:  CKD (chronic kidney disease) stage 4, GFR 15-29 ml/min  Hypertension  Chronic Type B Aortic Dissection  Chest pain  Tobacco abuse ***   Signed, Versie Starks, MHS 01/17/2014 8:25 AM    [facility] [place], [place], [place]  [address] Phone: [phone]; Fax: [phone]

## 2014-01-18 ENCOUNTER — Institutional Professional Consult (permissible substitution) (INDEPENDENT_AMBULATORY_CARE_PROVIDER_SITE_OTHER): Payer: Medicare Other | Admitting: Surgery

## 2014-01-18 ENCOUNTER — Encounter: Payer: Self-pay | Admitting: Surgery

## 2014-01-18 VITALS — BP 160/106 | HR 68 | Resp 16 | Ht 72.0 in | Wt 254.0 lb

## 2014-01-18 DIAGNOSIS — I7100 Dissection of unspecified site of aorta: Secondary | ICD-10-CM

## 2014-01-18 DIAGNOSIS — I712 Thoracic aortic aneurysm, without rupture, unspecified: Secondary | ICD-10-CM

## 2014-01-18 NOTE — Progress Notes (Signed)
PCP is Dr. Kennon Holter Referring Provider is Sueanne Margarita, MD  Chief Complaint  Patient presents with  . Thoracic Aortic Dissection    CHEST CT, ECHO...referred by Dr. Fransico Him    HPI:  The patient is a 48 year old gentleman with a history of poorly-controlled hypertension, brain aneurysm with subarachnoid bleed, stage IV chronic kidney disease followed by [facility], and chronic type B aortic dissection since 2006. He was seen in the ER in April 2015 for refill of his medications which he said he had not taken for the past year due to financial reasons. He was severely hypertensive at that time at 188/119. He reported some shortness of breath and chest pain for the past year. The chest pain is weekly, midsternal, and worse after eating. It lasts about 15-20 minutes and resolves. Antacids did not help. He had a Lexiscan myoview that was negative for ischemia. A 2D echo showed mild LVH with an EF of 55% and no wall motion abnormalities. There was no aortic stenosis, trivial regurgitation. A CT scan of the chest and abdomen without contrast showed the ascending aorta to measure 4.1 x 3.9 cm. The distal transverse arch is more dilated with thrombus and intimal calcification along the posterior lateral wall. There is no contrast so seeing the dissection is difficult but the calcification within the lumen is likely due to the dissected aortic wall. The maximum diameter of the distal arch is 4.8 cm. The proximal descending aorta tapers to 3.3 cm. There is no evidence of mediastinal or pleural hemorrhage.  Past Medical History  Diagnosis Date  . Brain aneurysm   . Aortic dissection     type B  . Hypertension   . Medically noncompliant     Past Surgical History  Procedure Laterality Date  . Back surgery      Family History  Problem Relation Age of Onset  . Alzheimer's disease Mother   . Cirrhosis Father   . Heart attack Brother     Social History History    Substance Use Topics  . Smoking status: Current Every Day Smoker -- 1.00 packs/day for 20 years    Types: Cigarettes  . Smokeless tobacco: Former Systems developer    Types: Chew  . Alcohol Use: No    Current Outpatient Prescriptions  Medication Sig Dispense Refill  . amLODipine (NORVASC) 10 MG tablet Take 10 mg by mouth daily.      . calcitRIOL (ROCALTROL) 0.25 MCG capsule Take 0.25 mcg by mouth daily.      . furosemide (LASIX) 20 MG tablet Take 20 mg by mouth.      . hydrALAZINE (APRESOLINE) 25 MG tablet Take 25 mg by mouth daily.      [name] lisinopril (PRINIVIL,ZESTRIL) 20 MG tablet Take 20 mg by mouth daily.       No current facility-administered medications for this visit.    No Known Allergies  Review of Systems  Constitutional: Positive for chills. Negative for fever, activity change and fatigue.  Eyes: Negative.   Respiratory: Positive for cough and shortness of breath.   Cardiovascular: Positive for chest pain.  Gastrointestinal: Negative.   Genitourinary: Negative.   Neurological: Positive for syncope and headaches.  Hematological: Negative.   Psychiatric/Behavioral: Negative.     BP 160/106  Pulse 68  Resp 16  Ht 6' (1.829 m)  Wt 254 lb (115.214 kg)  BMI 34.44 kg/m2  SpO2 97% Physical Exam  Constitutional: He is oriented to  person, place, and time. He appears well-nourished. No distress.  HENT:  Head: Normocephalic and atraumatic.  Mouth/Throat: Oropharynx is clear and moist.  Eyes: EOM are normal. Pupils are equal, round, and reactive to light.  Neck: Normal range of motion. Neck supple. No JVD present. No thyromegaly present.  Cardiovascular: Normal rate, regular rhythm, normal heart sounds and intact distal pulses.   No murmur heard. Pulmonary/Chest: Breath sounds normal. He is in respiratory distress.  Abdominal: Soft. Bowel sounds are normal. He exhibits no distension and no mass. There is no tenderness.  Musculoskeletal: He exhibits no edema.  Lymphadenopathy:     He has no cervical adenopathy.  Neurological: He is alert and oriented to person, place, and time. No cranial nerve deficit.  Skin: Skin is warm and dry.  Psychiatric: He has a normal mood and affect.    Diagnostic Tests:  CLINICAL DATA: Known aortic dissection. Exams specifically ordered  without intravenous contrast material.  EXAM:  CT CHEST AND ABDOMEN WITHOUT CONTRAST  TECHNIQUE:  Multidetector CT imaging of the chest and abdomen was performed  following the standard protocol without IV contrast.  COMPARISON: Abdomen and pelvis CT without contrast dated  05/19/2010.  FINDINGS:  CT CHEST FINDINGS  Ascending aorta measures 4.1 x 3.9 cm in orthogonal transverse  diameters. There is a thin, hypo attenuating rim along the left  lateral wall of the ascending aorta. Distal transverse aorta becomes  more dilated and there is thrombus and intimal calcification  clustered along the posterior lateral wall of the distal transverse  aorta. This segment of the distal transverse aorta measures 4.8 cm  in maximal diameter. There is relatively rapid tapering of the  proximal descending segment of aorta which measures 3.3 x 3.3 cm in  maximal transverse diameters at the level of the carina. There is no  evidence for mediastinal hemorrhage or fluid. Luminal patency cannot  be assessed without intravenous contrast material.  There is no lymphadenopathy in the axillary regions. 9 mm short axis  right paratracheal lymph node is at upper normal. No bulky  lymphadenopathy is seen in either hilum. The heart size is upper  normal. No pericardial effusion. The areas of subsegmental  atelectasis are seen in the dependent lung bases. No pulmonary  edema. No focal airspace consolidation.  Bone windows reveal no worrisome lytic or sclerotic osseous lesions.  CT ABDOMEN AND PELVIS FINDINGS  Liver: Normal uninfused appearance.  Spleen: The spleen is unremarkable on this study performed without   intravenous contrast material.  Stomach: Nondistended with normal imaging features.  Pancreas: No dilatation of the main duct. No mass lesion evident.  Gallbladder/Biliary Tree: No evidence for gallstones. No intra or  extrahepatic biliary duct dilatation.  Kidneys/Adrenals: No adrenal nodule or mass. No hydronephrosis in  either kidney. Lower poles of both kidneys appear slightly atrophic.  Bowel Loops: Duodenum is normal. No small bowel dilatation.  Visualized colonic segments have normal imaging features.  Nodes: No evidence for lymphadenopathy in the abdomen.  Vasculature: Infrarenal aorta measures 2.4 x 2.6 cm in maximum  orthogonal dimensions.  Bones/Musculoskeletal: Bone windows reveal no worrisome lytic or  sclerotic osseous lesions.  Body Wall: Small umbilical hernia contains only fat.  IMPRESSION:  Dilatation of the distal transverse aorta with intimal calcification  in apparent thrombus posterolaterally, consistent with the reported  history of known dissection. The extent of the dissection flap  cannot be assessed given the lack of intravenous contrast material.  A thin hypo attenuating rim along the  proximal ascending segment of  the aorta may be related to thrombus. No mediastinal or pleural  fluid.  Electronically Signed  By: Misty Stanley M.D.  On: 01/11/2014 08:53   *[facility] [address], [place] [address] [phone]  ------------------------------------------------------------------- Transthoracic Echocardiography  Patient: Demontre, Kimber MR #: [id number] Study Date: 01/13/2014 Gender: M Age: 51 Height: 182.9 cm Weight: 114.8 kg BSA: 2.45 m^2 Pt. Status: Room:  ORDERING Fransico Him, MD REFERRING Fransico Him, MD ATTENDING Loralie Champagne, M.D. SONOGRAPHER [facility] PERFORMING [facility], Outpatient  cc:  ------------------------------------------------------------------- LV EF:  55%  ------------------------------------------------------------------- Indications: Chest pain 786.51.  ------------------------------------------------------------------- History: PMH: Acquired from the patient and from the patient&'s chart. Chest pain. Dyspnea. Risk factors: Current tobacco use. Hypertension. CKD. Type B aortic dissection.  ------------------------------------------------------------------- Study Conclusions  - Left ventricle: The cavity size was normal. Wall thickness was increased in a pattern of mild LVH. Systolic function was normal. The estimated ejection fraction was 55%. Wall motion was normal; there were no regional wall motion abnormalities. Doppler parameters are consistent with abnormal left ventricular relaxation (grade 1 diastolic dysfunction). - Aortic valve: There was no stenosis. There was trivial regurgitation. - Mitral valve: There was trivial regurgitation. - Left atrium: The atrium was mildly dilated. - Right ventricle: The cavity size was normal. Systolic function was normal. - Tricuspid valve: Peak RV-RA gradient (S): 19 mm Hg. - Pulmonary arteries: PA peak pressure: 22 mm Hg (S). - Inferior vena cava: The vessel was normal in size. The respirophasic diameter changes were in the normal range (>= 50%), consistent with normal central venous pressure.  Impressions:  - Normal LV size and systolic function, EF [id number]. Mild LV hypertrophy. Normal RV size and systolic function. No significant valvular abnormalities.  -------------------------------------------------------------------  ------------------------------------------------------------------- Left ventricle: The cavity size was normal. Wall thickness was increased in a pattern of mild LVH. Systolic function was normal. The estimated ejection fraction was 55%. Wall motion was normal; there were no regional wall motion abnormalities. Doppler parameters are consistent with abnormal  left ventricular relaxation (grade 1 diastolic dysfunction).  ------------------------------------------------------------------- Aortic valve: Trileaflet; mildly calcified leaflets. Doppler: There was no stenosis. There was trivial regurgitation.  ------------------------------------------------------------------- Aorta: Aortic root: The aortic root was normal in size. Ascending aorta: The ascending aorta was normal in size.  ------------------------------------------------------------------- Mitral valve: Mildly calcified leaflets . Doppler: There was no evidence for stenosis. There was trivial regurgitation. Peak gradient (D): 4 mm Hg.  ------------------------------------------------------------------- Left atrium: The atrium was mildly dilated.  ------------------------------------------------------------------- Right ventricle: The cavity size was normal. Systolic function was normal.  ------------------------------------------------------------------- Pulmonic valve: Structurally normal valve. Cusp separation was normal. Doppler: Transvalvular velocity was within the normal range. There was trivial regurgitation.  ------------------------------------------------------------------- Tricuspid valve: Doppler: There was trivial regurgitation.  ------------------------------------------------------------------- Right atrium: The atrium was normal in size.  ------------------------------------------------------------------- Pericardium: There was no pericardial effusion.  ------------------------------------------------------------------- Systemic veins: Inferior vena cava: The vessel was normal in size. The respirophasic diameter changes were in the normal range (>= 50%), consistent with normal central venous pressure.  ------------------------------------------------------------------- Prepared and Electronically Authenticated by  Loralie Champagne,  M.D. 2015-05-29T14:41:06  [facility] [address]  [place],  [address]  [phone]    Cardiology Nuclear Med Study  Kyril Champigny is a 48 y.o. male MRN : [id number] DOB: June 27, 1966  Procedure Date: 01/11/2014  Nuclear Med Background  Indication for Stress Test: Evaluation for Ischemia  History: No Hx of CAD 2/06 ECHO: EF:  50-55% 10/08/04 MPI: NL EF: 54% H/O Aortic Dissection  Cardiac Risk Factors: Family History - CAD and Hypertension  Symptoms: Chest Pain, DOE, Palpitations and SOB  Nuclear Pre-Procedure  Caffeine/Decaff Intake: None  NPO After: 7:30pm   Lungs: clear  O2 Sat: 98% on room air.  IV 0.9% NS with Angio Cath: 22g   IV Site: R Hand  IV Started by: Crissie Figures, RN   Chest Size (in): 50  Cup Size: n/a   Height: 6' (1.829 m)  Weight: 256 lb (116.121 kg)   BMI: Body mass index is 34.71 kg/(m^2).  Tech Comments: N/A   Nuclear Med Study  1 or 2 day study: 1 day  Stress Test Type: Lexiscan   Reading MD: N/A  Order Authorizing Provider: Fransico Him, MD   Resting Radionuclide: Technetium 35m Sestamibi  Resting Radionuclide Dose: 11.0 mCi   Stress Radionuclide: Technetium 6m Sestamibi  Stress Radionuclide Dose: 33.0 mCi   Stress Protocol  Rest HR: 57  Stress HR: 78   Rest BP: 119/80  Stress BP: 132/86   Exercise Time (min): n/a  METS: n/a   Predicted Max HR: 170 bpm  % Max HR: 45.88 bpm  Rate Pressure Product: 10296  Dose of Adenosine (mg): n/a  Dose of Lexiscan: 0.4 mg   Dose of Atropine (mg): n/a  Dose of Dobutamine: n/a mcg/kg/min (at max HR)   Stress Test Technologist: Perrin Maltese, EMT-P  Nuclear Technologist: Charlton Amor, CNMT   Rest Procedure: Myocardial perfusion imaging was performed at rest 45 minutes following the intravenous administration of Technetium 49m Sestamibi.  Rest ECG: NSR - Normal EKG  Stress Procedure: The patient received IV Lexiscan 0.4 mg over 15-seconds. Technetium 73m Sestamibi injected at  30-seconds. This patient had sob, chest burning, throat itching, and a cough with the Lexiscan injection. Quantitative spect images were obtained after a 45 minute delay.  Stress ECG: No significant change from baseline ECG  QPS  Raw Data Images: Normal; no motion artifact; normal heart/lung ratio.  Stress Images: Normal homogeneous uptake in all areas of the myocardium.  Rest Images: Normal homogeneous uptake in all areas of the myocardium.  Subtraction (SDS): No evidence of ischemia.  Transient Ischemic Dilatation (Normal <1.22): 1.07  Lung/Heart Ratio (Normal <0.45): 0.33  Quantitative Gated Spect Images  QGS EDV: 144 ml  QGS ESV: 72 ml  Impression  Exercise Capacity: Lexiscan with no exercise.  BP Response: Normal blood pressure response.  Clinical Symptoms: Mild chest pain/dyspnea.  ECG Impression: No significant ST segment change suggestive of ischemia.  Comparison with Prior Nuclear Study: No images to compare  Overall Impression: Normal stress nuclear study.  LV Ejection Fraction: 50%. 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 I reviewed his CTA from 2006 and although he did not receive any contrast for his present study the aortic size appears unchanged. I don't think there is any need for intervention at this time except good blood pressure control. I reviewed the scans with the patient and his sister and stressed the importance of taking his blood pressure meds. He will require continued follow up of his aorta. I don't think that his chest pain and shortness of breath are due to this aneurysm or  dissection.   Plan:  I will see him back in 1 year with a CT scan of the chest.    I spent 80 minutes performing this consultation  and > 50% of this time was spent face to face counseling and coordinating the care of this patient's type B aortic dissection and aneurysm.

## 2014-01-25 ENCOUNTER — Encounter: Payer: Self-pay | Admitting: Physician Assistant

## 2014-01-27 ENCOUNTER — Ambulatory Visit: Payer: Medicare Other | Admitting: Cardiology

## 2014-04-14 ENCOUNTER — Ambulatory Visit (INDEPENDENT_AMBULATORY_CARE_PROVIDER_SITE_OTHER): Payer: Medicare Other | Admitting: Cardiology

## 2014-04-14 ENCOUNTER — Encounter: Payer: Self-pay | Admitting: Cardiology

## 2014-04-14 VITALS — BP 142/82 | HR 61 | Ht 72.0 in | Wt 252.8 lb

## 2014-04-14 DIAGNOSIS — N183 Chronic kidney disease, stage 3 unspecified: Secondary | ICD-10-CM

## 2014-04-14 DIAGNOSIS — I7100 Dissection of unspecified site of aorta: Secondary | ICD-10-CM

## 2014-04-14 DIAGNOSIS — R079 Chest pain, unspecified: Secondary | ICD-10-CM

## 2014-04-14 DIAGNOSIS — I10 Essential (primary) hypertension: Secondary | ICD-10-CM

## 2014-04-14 DIAGNOSIS — F17200 Nicotine dependence, unspecified, uncomplicated: Secondary | ICD-10-CM

## 2014-04-14 DIAGNOSIS — Z720 Tobacco use: Secondary | ICD-10-CM

## 2014-04-14 LAB — BASIC METABOLIC PANEL
BUN: 39 mg/dL — ABNORMAL HIGH (ref 6–23)
CO2: 25 mEq/L (ref 19–32)
Calcium: 9 mg/dL (ref 8.4–10.5)
Chloride: 107 mEq/L (ref 96–112)
Creatinine, Ser: 3.9 mg/dL — ABNORMAL HIGH (ref 0.4–1.5)
GFR: 21.12 mL/min — ABNORMAL LOW (ref >60.00)
Glucose, Bld: 100 mg/dL — ABNORMAL HIGH (ref 70–99)
Potassium: 4.5 mEq/L (ref 3.5–5.1)
Sodium: 139 mEq/L (ref 135–145)

## 2014-04-14 MED ORDER — ALLOPURINOL 100 MG PO TABS: 100.0000 mg | ORAL_TABLET | Freq: Two times a day (BID) | ORAL | Status: DC

## 2014-04-14 NOTE — Progress Notes (Signed)
[name], [place], [place]  [address] Phone: [phone] Fax:  [phone]  Date:  04/14/2014   ID:  [name] Downs, DOB [birth year], MRN [id number]  PCP:  No primary provider on file.  Cardiologist:  Fransico Him, MD     History of Present Illness: [name] Downs is a 48 y.o. male with a history of Type B aortic dissection, HTN, subarachnoid hemorrhage with right sided hemiparesis and tobacco abuse who was followed by Dr. Percival Spanish but and was last seen by him in 2009 who recently presented for evaluation of chest pain and SOB.  He had not taken his BP meds in over a year due to financial reasons. He recently got a medicaid card and got refills on his meds.He said that his SOB and chest pain had been going on for about a year. The chest pain would occur once a week lasting anywhere from 10 to 20 minutes and resolves with rest. It was located in the midsternal region with no radiation, nausea or diaphoresis. He underwent nuclear stress test showing no ischemia.  LVF was normal on echo with LVH.  He now presents back for followup after increasing BP meds.  He is followed by Dr. Cyndia Bent for his chronic dissection.  Of note he was also found to be in acute renal failure at last OV and was referred to [facility].  Today he is doing well.  Since I saw him last his chest pain and resolved and he denies any SOB.  He denies any LE edema, palpitations, dizziness or syncope.   Wt Readings from Last 3 Encounters:  04/14/14 252 lb 12.8 oz (114.669 kg)  01/18/14 254 lb (115.214 kg)  01/11/14 256 lb (116.121 kg)     Past Medical History  Diagnosis Date  . Brain aneurysm   . Aortic dissection     type B  . Hypertension   . Medically noncompliant     Current Outpatient Prescriptions  Medication Sig Dispense Refill  . allopurinol (ZYLOPRIM) 100 MG tablet Take 100 mg by mouth 2 (two) times daily.       [name] amLODipine (NORVASC) 10 MG tablet Take 10 mg by mouth daily.      . calcitRIOL  (ROCALTROL) 0.25 MCG capsule Take 0.25 mcg by mouth daily.      . furosemide (LASIX) 20 MG tablet Take 20 mg by mouth.      . hydrALAZINE (APRESOLINE) 25 MG tablet Take 25 mg by mouth daily.      [name] lisinopril (PRINIVIL,ZESTRIL) 20 MG tablet Take 20 mg by mouth daily.       No current facility-administered medications for this visit.    Allergies:   No Known Allergies  Social History:  The patient  reports that he has been smoking Cigarettes.  He has a 20 pack-year smoking history. He has quit using smokeless tobacco. His smokeless tobacco use included Chew. He reports that he does not drink alcohol or use illicit drugs.   Family History:  The patient's family history includes Alzheimer's disease in his mother; Cirrhosis in his father; Heart attack in his brother.   ROS:  Please see the history of present illness.      All other systems reviewed and negative.   PHYSICAL EXAM: VS:  BP 142/82  Pulse 61  Ht 6' (1.829 m)  Wt 252 lb 12.8 oz (114.669 kg)  BMI 34.28 kg/m2 Well nourished, well developed, in no acute distress HEENT: normal Neck: no JVD  Cardiac:  normal S1, S2; RRR; no murmur Lungs:  clear to auscultation bilaterally, no wheezing, rhonchi or rales Abd: soft, nontender, no hepatomegaly Ext: no edema Skin: warm and dry Neuro:  CNs 2-12 intact, no focal abnormalities noted  ASSESSMENT AND PLAN:  1. Chest pain - Lexiscan myoview showed no ischemia.  Chest pain now resolved and was most likely due to demand ischemia from poorly controlled HTN.  LVF normal on echo with LVH. 2. HTN- much improved. He needs aggressive BP control given his history of descending aortic dissection in the past.  - continue Hydralazine/Lisinopril/amlodipine 3. Chronic type B aortic dissection - followed by Dr. Cyndia Bent       4.   Chronic renal insuff - followed by [facility] - check BMET today        5.  Tobacco abuse ongoing - he weaning off slowly and is interested in trying Chantix to get off  completely.  Once we get his renal function labs back I will dose appropriately and call in prescription  Followup with me in 6 months   Signed, Fransico Him, MD 04/14/2014 8:45 AM

## 2014-04-14 NOTE — Patient Instructions (Addendum)
Your physician recommends that you continue on your current medications as directed. Please refer to the Current Medication list given to you today.  Your physician recommends that you go to the lab for a BMET   Your physician wants you to follow-up in: 6 months with Dr Mallie Snooks will receive a reminder letter in the mail two months in advance. If you don't receive a letter, please call our office to schedule the follow-up appointment.

## 2014-04-18 ENCOUNTER — Other Ambulatory Visit: Payer: Self-pay | Admitting: General Surgery

## 2014-04-18 DIAGNOSIS — Z720 Tobacco use: Secondary | ICD-10-CM

## 2014-04-18 MED ORDER — VARENICLINE TARTRATE 0.5 MG X 11 & 1 MG X 42 PO MISC: ORAL | Status: DC

## 2014-04-18 MED ORDER — VARENICLINE TARTRATE 1 MG PO TABS: ORAL_TABLET | ORAL | Status: DC

## 2014-04-18 NOTE — Progress Notes (Signed)
Recommend 0.5 mg qd x 1 week, then 0.5 mg bid for the next 11 weeks

## 2014-05-13 ENCOUNTER — Other Ambulatory Visit: Payer: Self-pay | Admitting: Cardiology

## 2014-05-15 ENCOUNTER — Other Ambulatory Visit: Payer: Self-pay | Admitting: General Surgery

## 2014-05-15 DIAGNOSIS — Z720 Tobacco use: Secondary | ICD-10-CM

## 2014-05-15 MED ORDER — VARENICLINE TARTRATE 1 MG PO TABS: ORAL_TABLET | ORAL | Status: DC

## 2014-05-15 NOTE — Telephone Encounter (Signed)
Refill what med?

## 2014-05-15 NOTE — Telephone Encounter (Signed)
No I sent in the continuation pack already that pt needs

## 2014-08-08 ENCOUNTER — Encounter: Payer: Self-pay | Admitting: Cardiology

## 2014-10-12 NOTE — Progress Notes (Signed)
This encounter was created in error - please disregard.

## 2014-10-13 ENCOUNTER — Encounter: Payer: Medicare Other | Admitting: Cardiology

## 2014-10-17 ENCOUNTER — Encounter: Payer: Self-pay | Admitting: Cardiology

## 2015-01-17 ENCOUNTER — Other Ambulatory Visit: Payer: Self-pay | Admitting: Surgery

## 2015-01-17 ENCOUNTER — Other Ambulatory Visit: Payer: Self-pay | Admitting: *Deleted

## 2015-01-18 ENCOUNTER — Other Ambulatory Visit: Payer: Self-pay

## 2015-01-18 ENCOUNTER — Other Ambulatory Visit: Payer: Self-pay | Admitting: Surgery

## 2015-01-18 ENCOUNTER — Other Ambulatory Visit: Payer: Self-pay | Admitting: *Deleted

## 2015-01-18 DIAGNOSIS — I7100 Dissection of unspecified site of aorta: Secondary | ICD-10-CM

## 2015-01-18 DIAGNOSIS — I71019 Dissection of thoracic aorta, unspecified: Secondary | ICD-10-CM

## 2015-01-18 DIAGNOSIS — I7101 Dissection of thoracic aorta: Secondary | ICD-10-CM

## 2015-01-31 ENCOUNTER — Ambulatory Visit: Payer: Medicare Other | Admitting: Surgery

## 2015-01-31 ENCOUNTER — Inpatient Hospital Stay: Admission: RE | Admit: 2015-01-31 | Payer: Medicare Other | Source: Ambulatory Visit

## 2015-02-21 ENCOUNTER — Other Ambulatory Visit: Payer: Medicare Other

## 2015-02-21 ENCOUNTER — Ambulatory Visit: Payer: Medicare Other | Admitting: Surgery

## 2015-02-22 ENCOUNTER — Telehealth: Payer: Self-pay | Admitting: *Deleted

## 2015-06-27 DIAGNOSIS — R7309 Other abnormal glucose: Secondary | ICD-10-CM | POA: Diagnosis not present

## 2015-06-27 DIAGNOSIS — I718 Aortic aneurysm of unspecified site, ruptured: Secondary | ICD-10-CM | POA: Diagnosis not present

## 2015-06-27 DIAGNOSIS — E669 Obesity, unspecified: Secondary | ICD-10-CM | POA: Diagnosis not present

## 2015-06-27 DIAGNOSIS — I129 Hypertensive chronic kidney disease with stage 1 through stage 4 chronic kidney disease, or unspecified chronic kidney disease: Secondary | ICD-10-CM | POA: Diagnosis not present

## 2015-06-27 DIAGNOSIS — N429 Disorder of prostate, unspecified: Secondary | ICD-10-CM | POA: Diagnosis not present

## 2015-06-27 DIAGNOSIS — M1000 Idiopathic gout, unspecified site: Secondary | ICD-10-CM | POA: Diagnosis not present

## 2015-07-06 DIAGNOSIS — H524 Presbyopia: Secondary | ICD-10-CM | POA: Diagnosis not present

## 2015-07-06 DIAGNOSIS — H5210 Myopia, unspecified eye: Secondary | ICD-10-CM | POA: Diagnosis not present

## 2015-07-19 DIAGNOSIS — M1000 Idiopathic gout, unspecified site: Secondary | ICD-10-CM | POA: Diagnosis not present

## 2015-07-19 DIAGNOSIS — I10 Essential (primary) hypertension: Secondary | ICD-10-CM | POA: Diagnosis not present

## 2015-07-19 DIAGNOSIS — E669 Obesity, unspecified: Secondary | ICD-10-CM | POA: Diagnosis not present

## 2015-07-19 DIAGNOSIS — R739 Hyperglycemia, unspecified: Secondary | ICD-10-CM | POA: Diagnosis not present

## 2015-08-22 DIAGNOSIS — I10 Essential (primary) hypertension: Secondary | ICD-10-CM | POA: Diagnosis not present

## 2015-08-22 DIAGNOSIS — Z1211 Encounter for screening for malignant neoplasm of colon: Secondary | ICD-10-CM | POA: Diagnosis not present

## 2015-08-22 DIAGNOSIS — M109 Gout, unspecified: Secondary | ICD-10-CM | POA: Diagnosis not present

## 2015-09-06 DIAGNOSIS — Z1211 Encounter for screening for malignant neoplasm of colon: Secondary | ICD-10-CM | POA: Diagnosis not present

## 2015-10-03 DIAGNOSIS — T7840XA Allergy, unspecified, initial encounter: Secondary | ICD-10-CM | POA: Diagnosis not present

## 2015-10-16 DIAGNOSIS — F17200 Nicotine dependence, unspecified, uncomplicated: Secondary | ICD-10-CM | POA: Diagnosis not present

## 2015-10-16 DIAGNOSIS — E785 Hyperlipidemia, unspecified: Secondary | ICD-10-CM | POA: Diagnosis not present

## 2015-10-16 DIAGNOSIS — R809 Proteinuria, unspecified: Secondary | ICD-10-CM | POA: Diagnosis not present

## 2015-10-16 DIAGNOSIS — M109 Gout, unspecified: Secondary | ICD-10-CM | POA: Diagnosis not present

## 2015-10-16 DIAGNOSIS — D631 Anemia in chronic kidney disease: Secondary | ICD-10-CM | POA: Diagnosis not present

## 2015-10-16 DIAGNOSIS — R319 Hematuria, unspecified: Secondary | ICD-10-CM | POA: Diagnosis not present

## 2015-10-16 DIAGNOSIS — N184 Chronic kidney disease, stage 4 (severe): Secondary | ICD-10-CM | POA: Diagnosis not present

## 2015-10-16 DIAGNOSIS — N2581 Secondary hyperparathyroidism of renal origin: Secondary | ICD-10-CM | POA: Diagnosis not present

## 2015-10-16 DIAGNOSIS — I129 Hypertensive chronic kidney disease with stage 1 through stage 4 chronic kidney disease, or unspecified chronic kidney disease: Secondary | ICD-10-CM | POA: Diagnosis not present

## 2015-10-23 ENCOUNTER — Other Ambulatory Visit: Payer: Self-pay

## 2015-10-23 DIAGNOSIS — N185 Chronic kidney disease, stage 5: Secondary | ICD-10-CM

## 2015-10-23 DIAGNOSIS — I129 Hypertensive chronic kidney disease with stage 1 through stage 4 chronic kidney disease, or unspecified chronic kidney disease: Secondary | ICD-10-CM | POA: Diagnosis not present

## 2015-10-23 DIAGNOSIS — T7840XA Allergy, unspecified, initial encounter: Secondary | ICD-10-CM | POA: Diagnosis not present

## 2015-10-23 DIAGNOSIS — Z01810 Encounter for preprocedural cardiovascular examination: Secondary | ICD-10-CM

## 2015-10-29 ENCOUNTER — Encounter: Payer: Self-pay | Admitting: Vascular Surgery

## 2015-11-02 ENCOUNTER — Ambulatory Visit (HOSPITAL_COMMUNITY)
Admission: RE | Admit: 2015-11-02 | Discharge: 2015-11-02 | Disposition: A | Discharge status: Home / Self Care | Payer: Commercial Managed Care - HMO | Source: Ambulatory Visit | Attending: Pediatrics | Admitting: Pediatrics

## 2015-11-02 ENCOUNTER — Ambulatory Visit (INDEPENDENT_AMBULATORY_CARE_PROVIDER_SITE_OTHER)
Admission: RE | Admit: 2015-11-02 | Discharge: 2015-11-02 | Disposition: A | Discharge status: Home / Self Care | Payer: Commercial Managed Care - HMO | Source: Ambulatory Visit | Attending: Vascular Surgery | Admitting: Vascular Surgery

## 2015-11-02 ENCOUNTER — Encounter: Payer: Self-pay | Admitting: Vascular Surgery

## 2015-11-02 ENCOUNTER — Ambulatory Visit (INDEPENDENT_AMBULATORY_CARE_PROVIDER_SITE_OTHER): Payer: Commercial Managed Care - HMO | Admitting: Vascular Surgery

## 2015-11-02 VITALS — BP 129/76 | HR 68 | Ht 72.0 in | Wt 250.9 lb

## 2015-11-02 DIAGNOSIS — I120 Hypertensive chronic kidney disease with stage 5 chronic kidney disease or end stage renal disease: Secondary | ICD-10-CM | POA: Diagnosis not present

## 2015-11-02 DIAGNOSIS — Z01810 Encounter for preprocedural cardiovascular examination: Secondary | ICD-10-CM

## 2015-11-02 DIAGNOSIS — N185 Chronic kidney disease, stage 5: Secondary | ICD-10-CM

## 2015-11-02 DIAGNOSIS — N184 Chronic kidney disease, stage 4 (severe): Secondary | ICD-10-CM

## 2015-11-02 NOTE — Progress Notes (Signed)
Referred by:  Dr. Jimmy Footman   Reason for referral: New access  History of Present Illness  [name] Downs is a 50 y.o. (07/13/1966) male who presents for evaluation for permanent access.  The patient is left hand dominant.  The patient has not had previous access procedures.  Previous central venous cannulation procedures include: none.  The patient has never had a PPM placed.   Past Medical History  Diagnosis Date  . Brain aneurysm   . Aortic dissection (HCC)     type B  . Hypertension   . Medically noncompliant     Past Surgical History  Procedure Laterality Date  . Back surgery      Social History   Social History  . Marital Status: Single    Spouse Name: N/A  . Number of Children: N/A  . Years of Education: N/A   Occupational History  . Not on file.   Social History Main Topics  . Smoking status: Current Every Day Smoker -- 1.00 packs/day for 20 years    Types: Cigarettes  . Smokeless tobacco: Former Systems developer    Types: Chew  . Alcohol Use: No  . Drug Use: No  . Sexual Activity: Not on file   Other Topics Concern  . Not on file   Social History Narrative     Family History  Problem Relation Age of Onset  . Alzheimer's disease Mother   . Cirrhosis Father   . Heart attack Brother     Current Outpatient Prescriptions  Medication Sig Dispense Refill  . allopurinol (ZYLOPRIM) 100 MG tablet Take 1 tablet (100 mg total) by mouth 2 (two) times daily. 60 tablet 6  . amLODipine (NORVASC) 10 MG tablet Take 10 mg by mouth daily.    . calcitRIOL (ROCALTROL) 0.25 MCG capsule Take 0.25 mcg by mouth daily.    . furosemide (LASIX) 20 MG tablet Take 20 mg by mouth.    . hydrALAZINE (APRESOLINE) 25 MG tablet Take 25 mg by mouth daily.    [name] lisinopril (PRINIVIL,ZESTRIL) 20 MG tablet Take 20 mg by mouth daily.    . varenicline (CHANTIX CONTINUING MONTH PAK) 1 MG tablet Recommend 0.5 mg qd x 1 week, then 0.5 mg bid for the next 11 weeks 30 tablet 3  . varenicline  (CHANTIX STARTING MONTH PAK) 0.5 MG X 11 & 1 MG X 42 tablet Recommend 0.5 mg qd x 1 week, then 0.5 mg bid for the next 11 weeks 30 tablet 0   No current facility-administered medications for this visit.    No Known Allergies  REVIEW OF SYSTEMS:  (Positives checked otherwise negative)  CARDIOVASCULAR:   [ ]  chest pain,  [ ]  chest pressure,  [x]  palpitations,  [ ]  shortness of breath when laying flat,  [ ]  shortness of breath with exertion,   [ ]  pain in feet when walking,  [ ]  pain in feet when laying flat, [ ]  history of blood clot in veins (DVT),  [ ]  history of phlebitis,  [ ]  swelling in legs,  [ ]  varicose veins  PULMONARY:   [x]  productive cough,  [ ]  asthma,  [ ]  wheezing  NEUROLOGIC:   [x]  weakness in arms or legs,  [x]  numbness in arms or legs,  [ ]  difficulty speaking or slurred speech,  [ ]  temporary loss of vision in one eye,  [ ]  dizziness  HEMATOLOGIC:   [ ]  bleeding problems,  [ ]  problems with blood clotting too easily  MUSCULOSKEL:   [ ]  joint pain, [ ]  joint swelling  GASTROINTEST:   [ ]  vomiting blood,  [ ]  blood in stool     GENITOURINARY:   [ ]  burning with urination,  [ ]  blood in urine  PSYCHIATRIC:   [ ]  history of major depression  INTEGUMENTARY:   [ ]  rashes,  [ ]  ulcers  CONSTITUTIONAL:   [ ]  fever,  [ ]  chills   Physical Examination  Filed Vitals:   11/02/15 1104  BP: 129/76  Pulse: 68  Height: 6' (1.829 m)  Weight: 250 lb 14.4 oz (113.807 kg)  SpO2: 97%   Body mass index is 34.02 kg/(m^2).  General: A&O x 3, WDWN  Head: [place]/AT Ear/Nose/Throat: Hearing grossly intact, nares w/o erythema or drainage, oropharynx w/o Erythema/Exudate, Mallampati score: 3  Eyes: PERRLA, EOMI  Neck: Supple, no nuchal rigidity, no palpable LAD  Pulmonary: Sym exp, good air movt, CTAB, no rales, rhonchi, & wheezing  Cardiac: RRR, Nl S1, S2, no Murmurs, rubs or gallops  Vascular: Vessel Right Left  Radial Palpable Palpable    Ulnar Faintly Palpable Faintly Palpable  Brachial Palpable Palpable  Carotid Palpable, without bruit Palpable, without bruit  Aorta Not palpable N/A  Femoral Palpable Palpable  Popliteal Not palpable Not palpable  PT Palpable Palpable  DP Palpable Palpable   Gastrointestinal: soft, NTND, -G/R, - HSM, - masses, - CVAT B  Musculoskeletal: M/S 5/5 throughout , Extremities without ischemic changes   Neurologic: CN 2-12 intact , Pain and light touch intact in extremities , Motor exam as listed above  Psychiatric: Judgment intact, Mood & affect appropriate for pt's clinical situation  Dermatologic: See M/S exam for extremity exam, no rashes otherwise noted  Lymph : No Cervical, Axillary, or Inguinal lymphadenopathy    Non-Invasive Vascular Imaging  Vein Mapping  (Date: 11/02/2015):   R arm: acceptable vein conduits include upper arm cephalic and basilic vein  L arm: acceptable vein conduits include entired cephalic, upper arm basilic  BUE Doppler (Date: 11/02/2015):   R arm:   Brachial: tri, 5.3  mm  Radial: tri, 3.3 mm  Ulnar: tri, 2.0 mm  L arm:   Brachial: tri, 5.4 mm  Radial: tri, 2.8 mm  Ulnar: tri, 2.9 mm   Outside Studies/Documentation 10 pages of outside documents were reviewed including: outpatient nephrology chart.   Medical Decision Making  [name] is a 50 y.o. male who presents with chronic kidney disease stage IV    Based on vein mapping and examination, this patient's permanent access options include: R BC AVF (less likely RC AVF), L RC vs BC AVF, B staged BVT  I had an extensive discussion with this patient in regards to the nature of access surgery, including risk, benefits, and alternatives.    The patient is aware that the risks of access surgery include but are not limited to: bleeding, infection, steal syndrome, nerve damage, ischemic monomelic neuropathy, failure of access to mature, and possible need for additional access procedures  in the future.  The patient has not agreed to proceed with the above procedure.  He is going to attend in education classes and talk it over with his nephrologist first.   Adele Barthel, MD [facility] Office: [phone] Pager: [phone]  11/02/2015, 8:38 AM

## 2015-11-21 ENCOUNTER — Other Ambulatory Visit: Payer: Self-pay

## 2015-11-28 DIAGNOSIS — T7840XA Allergy, unspecified, initial encounter: Secondary | ICD-10-CM | POA: Diagnosis not present

## 2015-11-28 DIAGNOSIS — M1000 Idiopathic gout, unspecified site: Secondary | ICD-10-CM | POA: Diagnosis not present

## 2015-11-28 DIAGNOSIS — I129 Hypertensive chronic kidney disease with stage 1 through stage 4 chronic kidney disease, or unspecified chronic kidney disease: Secondary | ICD-10-CM | POA: Diagnosis not present

## 2015-11-28 DIAGNOSIS — I10 Essential (primary) hypertension: Secondary | ICD-10-CM | POA: Diagnosis not present

## 2015-11-28 DIAGNOSIS — R7309 Other abnormal glucose: Secondary | ICD-10-CM | POA: Diagnosis not present

## 2015-12-14 ENCOUNTER — Other Ambulatory Visit: Payer: Self-pay

## 2015-12-14 NOTE — Progress Notes (Signed)
Attempted to call pt for pre-op call. Pt is on the highway driving and won't be home for 2 hours or more. I told him that I would call him back and asked him not to answer his phone and I would leave pre-op instructions on his voicemail. He was agreeable with that. Left pre-op instructions on his phone. Pt to arrive Monday at 7:40 AM.  Pt has Type B Aortic Aneurysm dissection that is followed by Dr. Cyndia Bent. Last visit in 2015 Dr. Cyndia Bent noted he wanted to pt back in a year. Pt has not been back.  Pt saw Dr. Radford Pax for chest pain and uncontrollable HTN in 2015 and she noted she wanted to see him in 6 months. Pt has not been back.  Spoke with Dr. Veatrice Kells, anesthesiologist about pt. He states pt will be evaluated when he comes in on Monday.

## 2015-12-16 MED ORDER — DEXTROSE 5 % IV SOLN
1.5000 g | INTRAVENOUS | Status: AC
  Administered 2015-12-17: 1.5 g via INTRAVENOUS
  Filled 2015-12-16: qty 1.5

## 2015-12-16 MED ORDER — SODIUM CHLORIDE 0.9 % IV SOLN
INTRAVENOUS | Status: DC
  Administered 2015-12-17 (×3): via INTRAVENOUS

## 2015-12-17 ENCOUNTER — Ambulatory Visit (HOSPITAL_COMMUNITY)
Admission: RE | Admit: 2015-12-17 | Discharge: 2015-12-17 | Disposition: A | Discharge status: Home / Self Care | Payer: Commercial Managed Care - HMO | Source: Ambulatory Visit | Attending: Vascular Surgery | Admitting: Vascular Surgery

## 2015-12-17 ENCOUNTER — Encounter (HOSPITAL_COMMUNITY)
Admission: RE | Disposition: A | Discharge status: Home / Self Care | Payer: Self-pay | Source: Ambulatory Visit | Attending: Vascular Surgery

## 2015-12-17 ENCOUNTER — Ambulatory Visit (HOSPITAL_COMMUNITY): Payer: Commercial Managed Care - HMO | Admitting: Certified Registered Nurse Anesthetist

## 2015-12-17 ENCOUNTER — Ambulatory Visit (HOSPITAL_COMMUNITY): Payer: Commercial Managed Care - HMO | Admitting: Anesthesiology

## 2015-12-17 ENCOUNTER — Other Ambulatory Visit: Payer: Self-pay | Admitting: *Deleted

## 2015-12-17 ENCOUNTER — Encounter (HOSPITAL_COMMUNITY): Payer: Self-pay | Admitting: General Practice

## 2015-12-17 DIAGNOSIS — F17210 Nicotine dependence, cigarettes, uncomplicated: Secondary | ICD-10-CM | POA: Diagnosis not present

## 2015-12-17 DIAGNOSIS — Z9119 Patient's noncompliance with other medical treatment and regimen: Secondary | ICD-10-CM | POA: Insufficient documentation

## 2015-12-17 DIAGNOSIS — Z79899 Other long term (current) drug therapy: Secondary | ICD-10-CM | POA: Diagnosis not present

## 2015-12-17 DIAGNOSIS — Y832 Surgical operation with anastomosis, bypass or graft as the cause of abnormal reaction of the patient, or of later complication, without mention of misadventure at the time of the procedure: Secondary | ICD-10-CM | POA: Diagnosis not present

## 2015-12-17 DIAGNOSIS — T82868A Thrombosis of vascular prosthetic devices, implants and grafts, initial encounter: Secondary | ICD-10-CM | POA: Diagnosis not present

## 2015-12-17 DIAGNOSIS — N184 Chronic kidney disease, stage 4 (severe): Secondary | ICD-10-CM | POA: Insufficient documentation

## 2015-12-17 DIAGNOSIS — I129 Hypertensive chronic kidney disease with stage 1 through stage 4 chronic kidney disease, or unspecified chronic kidney disease: Secondary | ICD-10-CM | POA: Insufficient documentation

## 2015-12-17 DIAGNOSIS — G4730 Sleep apnea, unspecified: Secondary | ICD-10-CM | POA: Diagnosis not present

## 2015-12-17 DIAGNOSIS — Y92530 Ambulatory surgery center as the place of occurrence of the external cause: Secondary | ICD-10-CM | POA: Diagnosis not present

## 2015-12-17 DIAGNOSIS — N186 End stage renal disease: Secondary | ICD-10-CM

## 2015-12-17 DIAGNOSIS — Z4931 Encounter for adequacy testing for hemodialysis: Secondary | ICD-10-CM

## 2015-12-17 DIAGNOSIS — T82818A Embolism of vascular prosthetic devices, implants and grafts, initial encounter: Secondary | ICD-10-CM | POA: Diagnosis not present

## 2015-12-17 HISTORY — DX: Gout, unspecified: M10.9

## 2015-12-17 HISTORY — DX: Unspecified hearing loss, left ear: H91.92

## 2015-12-17 HISTORY — PX: AV FISTULA PLACEMENT: SHX1204

## 2015-12-17 HISTORY — DX: Sleep apnea, unspecified: G47.30

## 2015-12-17 LAB — POCT I-STAT 4, (NA,K, GLUC, HGB,HCT)
Glucose, Bld: 95 mg/dL (ref 65–99)
HCT: 43 % (ref 39.0–52.0)
Hemoglobin: 14.6 g/dL (ref 13.0–17.0)
Potassium: 4.6 mmol/L (ref 3.5–5.1)
Sodium: 144 mmol/L (ref 135–145)

## 2015-12-17 SURGERY — ARTERIOVENOUS (AV) FISTULA CREATION
Anesthesia: Monitor Anesthesia Care | Site: Arm Lower | Laterality: Right

## 2015-12-17 SURGERY — ARTERIOVENOUS (AV) FISTULA CREATION
Anesthesia: Monitor Anesthesia Care | Site: Arm Upper | Laterality: Right

## 2015-12-17 MED ORDER — LIDOCAINE HCL (PF) 1 % IJ SOLN
INTRAMUSCULAR | Status: DC | PRN
  Administered 2015-12-17: 13 mL

## 2015-12-17 MED ORDER — EPHEDRINE 5 MG/ML INJ
INTRAVENOUS | Status: AC
  Filled 2015-12-17: qty 10

## 2015-12-17 MED ORDER — CHLORHEXIDINE GLUCONATE CLOTH 2 % EX PADS: 6.0000 | MEDICATED_PAD | Freq: Once | CUTANEOUS | Status: DC

## 2015-12-17 MED ORDER — OXYCODONE HCL 5 MG PO TABS
5.0000 mg | ORAL_TABLET | Freq: Once | ORAL | Status: AC | PRN
  Administered 2015-12-17: 5 mg via ORAL

## 2015-12-17 MED ORDER — ONDANSETRON HCL 4 MG/2ML IJ SOLN
INTRAMUSCULAR | Status: DC | PRN
  Administered 2015-12-17: 4 mg via INTRAVENOUS

## 2015-12-17 MED ORDER — HYDROMORPHONE HCL 1 MG/ML IJ SOLN: 0.2500 mg | INTRAMUSCULAR | Status: DC | PRN

## 2015-12-17 MED ORDER — MIDAZOLAM HCL 5 MG/5ML IJ SOLN
INTRAMUSCULAR | Status: DC | PRN
  Administered 2015-12-17: 1 mg via INTRAVENOUS

## 2015-12-17 MED ORDER — PHENYLEPHRINE HCL 10 MG/ML IJ SOLN
INTRAMUSCULAR | Status: DC | PRN
  Administered 2015-12-17 (×5): 40 ug via INTRAVENOUS

## 2015-12-17 MED ORDER — FENTANYL CITRATE (PF) 100 MCG/2ML IJ SOLN: 25.0000 ug | INTRAMUSCULAR | Status: DC | PRN

## 2015-12-17 MED ORDER — LIDOCAINE HCL (CARDIAC) 20 MG/ML IV SOLN
INTRAVENOUS | Status: DC | PRN
  Administered 2015-12-17: 60 mg via INTRATRACHEAL

## 2015-12-17 MED ORDER — LIDOCAINE HCL (PF) 1 % IJ SOLN
INTRAMUSCULAR | Status: DC | PRN
  Administered 2015-12-17: 15 mL

## 2015-12-17 MED ORDER — FENTANYL CITRATE (PF) 100 MCG/2ML IJ SOLN
INTRAMUSCULAR | Status: DC | PRN
  Administered 2015-12-17 (×2): 25 ug via INTRAVENOUS

## 2015-12-17 MED ORDER — PHENYLEPHRINE HCL 10 MG/ML IJ SOLN: 10.0000 mg | INTRAMUSCULAR | Status: DC | PRN

## 2015-12-17 MED ORDER — ONDANSETRON HCL 4 MG/2ML IJ SOLN: 4.0000 mg | Freq: Once | INTRAMUSCULAR | Status: DC | PRN

## 2015-12-17 MED ORDER — FENTANYL CITRATE (PF) 250 MCG/5ML IJ SOLN
INTRAMUSCULAR | Status: DC | PRN
  Administered 2015-12-17 (×2): 50 ug via INTRAVENOUS

## 2015-12-17 MED ORDER — SODIUM CHLORIDE 0.9 % IV SOLN
INTRAVENOUS | Status: DC | PRN
  Administered 2015-12-17: 500 mL

## 2015-12-17 MED ORDER — PROPOFOL 500 MG/50ML IV EMUL
INTRAVENOUS | Status: DC | PRN
  Administered 2015-12-17: 50 ug/kg/min via INTRAVENOUS
  Administered 2015-12-17: 16:00:00 via INTRAVENOUS

## 2015-12-17 MED ORDER — FENTANYL CITRATE (PF) 250 MCG/5ML IJ SOLN
INTRAMUSCULAR | Status: AC
  Filled 2015-12-17: qty 5

## 2015-12-17 MED ORDER — MIDAZOLAM HCL 2 MG/2ML IJ SOLN
INTRAMUSCULAR | Status: AC
  Filled 2015-12-17: qty 2

## 2015-12-17 MED ORDER — PROTAMINE SULFATE 10 MG/ML IV SOLN
INTRAVENOUS | Status: DC | PRN
  Administered 2015-12-17 (×5): 10 mg via INTRAVENOUS

## 2015-12-17 MED ORDER — LIDOCAINE 2% (20 MG/ML) 5 ML SYRINGE
INTRAMUSCULAR | Status: AC
Start: 2015-12-17 — End: 2015-12-17
  Filled 2015-12-17: qty 5

## 2015-12-17 MED ORDER — THROMBIN 20000 UNITS EX SOLR
CUTANEOUS | Status: AC
  Filled 2015-12-17: qty 20000

## 2015-12-17 MED ORDER — OXYCODONE HCL 5 MG PO TABS
ORAL_TABLET | ORAL | Status: AC
  Filled 2015-12-17: qty 1

## 2015-12-17 MED ORDER — PHENYLEPHRINE 40 MCG/ML (10ML) SYRINGE FOR IV PUSH (FOR BLOOD PRESSURE SUPPORT)
PREFILLED_SYRINGE | INTRAVENOUS | Status: AC
  Filled 2015-12-17: qty 10

## 2015-12-17 MED ORDER — LIDOCAINE HCL (PF) 1 % IJ SOLN
INTRAMUSCULAR | Status: AC
  Filled 2015-12-17: qty 30

## 2015-12-17 MED ORDER — 0.9 % SODIUM CHLORIDE (POUR BTL) OPTIME
TOPICAL | Status: DC | PRN
  Administered 2015-12-17: 1000 mL

## 2015-12-17 MED ORDER — ONDANSETRON HCL 4 MG/2ML IJ SOLN
INTRAMUSCULAR | Status: AC
  Filled 2015-12-17: qty 2

## 2015-12-17 MED ORDER — OXYCODONE-ACETAMINOPHEN 5-325 MG PO TABS: 1.0000 | ORAL_TABLET | Freq: Four times a day (QID) | ORAL | Status: DC | PRN

## 2015-12-17 MED ORDER — FENTANYL CITRATE (PF) 100 MCG/2ML IJ SOLN
25.0000 ug | INTRAMUSCULAR | Status: DC | PRN
  Administered 2015-12-17 (×2): 50 ug via INTRAVENOUS

## 2015-12-17 MED ORDER — MEPERIDINE HCL 25 MG/ML IJ SOLN: 6.2500 mg | INTRAMUSCULAR | Status: DC | PRN

## 2015-12-17 MED ORDER — PROPOFOL 500 MG/50ML IV EMUL
INTRAVENOUS | Status: DC | PRN
  Administered 2015-12-17: 50 ug/kg/min via INTRAVENOUS

## 2015-12-17 MED ORDER — HEPARIN SODIUM (PORCINE) 1000 UNIT/ML IJ SOLN
INTRAMUSCULAR | Status: DC | PRN
  Administered 2015-12-17: 10000 [IU] via INTRAVENOUS

## 2015-12-17 MED ORDER — OXYCODONE HCL 5 MG/5ML PO SOLN: 5.0000 mg | Freq: Once | ORAL | Status: AC | PRN

## 2015-12-17 MED ORDER — EPHEDRINE SULFATE 50 MG/ML IJ SOLN
INTRAMUSCULAR | Status: DC | PRN
  Administered 2015-12-17 (×3): 5 mg via INTRAVENOUS

## 2015-12-17 MED ORDER — MIDAZOLAM HCL 2 MG/2ML IJ SOLN
INTRAMUSCULAR | Status: DC | PRN
  Administered 2015-12-17 (×2): 1 mg via INTRAVENOUS

## 2015-12-17 MED ORDER — FENTANYL CITRATE (PF) 100 MCG/2ML IJ SOLN
INTRAMUSCULAR | Status: AC
  Filled 2015-12-17: qty 2

## 2015-12-17 MED ORDER — CEFUROXIME SODIUM 1.5 G IJ SOLR
1.5000 g | INTRAMUSCULAR | Status: AC
  Administered 2015-12-17: 1.5 g via INTRAVENOUS
  Filled 2015-12-17: qty 1.5

## 2015-12-17 MED ORDER — HEPARIN SODIUM (PORCINE) 1000 UNIT/ML IJ SOLN
INTRAMUSCULAR | Status: DC | PRN
  Administered 2015-12-17: 5000 [IU] via INTRAVENOUS

## 2015-12-17 MED ORDER — PROTAMINE SULFATE 10 MG/ML IV SOLN
INTRAVENOUS | Status: AC
Start: 2015-12-17 — End: 2015-12-17
  Filled 2015-12-17: qty 5

## 2015-12-17 MED ORDER — OXYCODONE HCL 5 MG PO TABS
ORAL_TABLET | ORAL | Status: AC
  Administered 2015-12-17: 5 mg via ORAL
  Filled 2015-12-17: qty 1

## 2015-12-17 MED ORDER — PHENYLEPHRINE HCL 10 MG/ML IJ SOLN
INTRAMUSCULAR | Status: DC | PRN
  Administered 2015-12-17 (×3): 80 ug via INTRAVENOUS

## 2015-12-17 MED ORDER — PROPOFOL 10 MG/ML IV BOLUS
INTRAVENOUS | Status: DC | PRN
  Administered 2015-12-17 (×2): 20 mg via INTRAVENOUS
  Administered 2015-12-17: 10 mg via INTRAVENOUS

## 2015-12-17 SURGICAL SUPPLY — 34 items
ARMBAND PINK RESTRICT EXTREMIT (MISCELLANEOUS) ×2 IMPLANT
CANISTER SUCTION 2500CC (MISCELLANEOUS) ×2 IMPLANT
CANNULA VESSEL 3MM 2 BLNT TIP (CANNULA) ×2 IMPLANT
CLIP TI MEDIUM 6 (CLIP) ×2 IMPLANT
CLIP TI WIDE RED SMALL 6 (CLIP) ×2 IMPLANT
COVER PROBE W GEL 5X96 (DRAPES) ×2 IMPLANT
DECANTER SPIKE VIAL GLASS SM (MISCELLANEOUS) ×2 IMPLANT
DRAIN PENROSE 1/4X12 LTX STRL (WOUND CARE) ×2 IMPLANT
ELECT REM PT RETURN 9FT ADLT (ELECTROSURGICAL) ×2
ELECTRODE REM PT RTRN 9FT ADLT (ELECTROSURGICAL) ×1 IMPLANT
GLOVE BIO SURGEON STRL SZ 6.5 (GLOVE) ×2 IMPLANT
GLOVE BIO SURGEON STRL SZ7.5 (GLOVE) ×2 IMPLANT
GLOVE BIOGEL PI IND STRL 6.5 (GLOVE) ×4 IMPLANT
GLOVE BIOGEL PI IND STRL 8 (GLOVE) ×1 IMPLANT
GLOVE BIOGEL PI INDICATOR 6.5 (GLOVE) ×4
GLOVE BIOGEL PI INDICATOR 8 (GLOVE) ×1
GLOVE ECLIPSE 7.5 STRL STRAW (GLOVE) ×2 IMPLANT
GLOVE SURG SS PI 6.5 STRL IVOR (GLOVE) ×2 IMPLANT
GOWN STRL REUS W/ TWL LRG LVL3 (GOWN DISPOSABLE) ×5 IMPLANT
GOWN STRL REUS W/TWL LRG LVL3 (GOWN DISPOSABLE) ×5
KIT BASIN OR (CUSTOM PROCEDURE TRAY) ×2 IMPLANT
KIT ROOM TURNOVER OR (KITS) ×2 IMPLANT
LIQUID BAND (GAUZE/BANDAGES/DRESSINGS) ×2 IMPLANT
LOOP VESSEL MINI RED (MISCELLANEOUS) IMPLANT
NS IRRIG 1000ML POUR BTL (IV SOLUTION) ×2 IMPLANT
PACK CV ACCESS (CUSTOM PROCEDURE TRAY) ×2 IMPLANT
PAD ARMBOARD 7.5X6 YLW CONV (MISCELLANEOUS) ×4 IMPLANT
SPONGE SURGIFOAM ABS GEL 100 (HEMOSTASIS) IMPLANT
SUT PROLENE 7 0 BV 1 (SUTURE) ×2 IMPLANT
SUT VIC AB 3-0 SH 27 (SUTURE) ×1
SUT VIC AB 3-0 SH 27X BRD (SUTURE) ×1 IMPLANT
SUT VICRYL 4-0 PS2 18IN ABS (SUTURE) ×2 IMPLANT
UNDERPAD 30X30 INCONTINENT (UNDERPADS AND DIAPERS) ×2 IMPLANT
WATER STERILE IRR 1000ML POUR (IV SOLUTION) ×2 IMPLANT

## 2015-12-17 SURGICAL SUPPLY — 35 items
ARMBAND PINK RESTRICT EXTREMIT (MISCELLANEOUS) ×2 IMPLANT
CANISTER SUCTION 2500CC (MISCELLANEOUS) ×2 IMPLANT
CANNULA VESSEL 3MM 2 BLNT TIP (CANNULA) ×2 IMPLANT
CATH EMB 3FR 40CM (CATHETERS) ×2 IMPLANT
CLIP TI MEDIUM 6 (CLIP) ×2 IMPLANT
CLIP TI WIDE RED SMALL 6 (CLIP) ×2 IMPLANT
COVER PROBE W GEL 5X96 (DRAPES) IMPLANT
DECANTER SPIKE VIAL GLASS SM (MISCELLANEOUS) ×2 IMPLANT
DRAIN PENROSE 1/4X12 LTX STRL (WOUND CARE) ×4 IMPLANT
ELECT REM PT RETURN 9FT ADLT (ELECTROSURGICAL) ×2
ELECTRODE REM PT RTRN 9FT ADLT (ELECTROSURGICAL) ×1 IMPLANT
GLOVE BIO SURGEON STRL SZ 6.5 (GLOVE) ×2 IMPLANT
GLOVE BIO SURGEON STRL SZ7.5 (GLOVE) ×2 IMPLANT
GLOVE BIOGEL PI IND STRL 6.5 (GLOVE) ×3 IMPLANT
GLOVE BIOGEL PI INDICATOR 6.5 (GLOVE) ×3
GLOVE SURG SS PI 6.5 STRL IVOR (GLOVE) ×2 IMPLANT
GOWN STRL REUS W/ TWL LRG LVL3 (GOWN DISPOSABLE) ×3 IMPLANT
GOWN STRL REUS W/TWL LRG LVL3 (GOWN DISPOSABLE) ×3
KIT BASIN OR (CUSTOM PROCEDURE TRAY) ×2 IMPLANT
KIT ROOM TURNOVER OR (KITS) ×2 IMPLANT
LIQUID BAND (GAUZE/BANDAGES/DRESSINGS) ×2 IMPLANT
LOOP VESSEL MINI RED (MISCELLANEOUS) IMPLANT
NS IRRIG 1000ML POUR BTL (IV SOLUTION) ×2 IMPLANT
PACK CV ACCESS (CUSTOM PROCEDURE TRAY) ×2 IMPLANT
PAD ARMBOARD 7.5X6 YLW CONV (MISCELLANEOUS) ×4 IMPLANT
SPONGE SURGIFOAM ABS GEL 100 (HEMOSTASIS) IMPLANT
SUT PROLENE 7 0 BV 1 (SUTURE) ×6 IMPLANT
SUT SILK 3 0 (SUTURE) ×1
SUT SILK 3-0 18XBRD TIE 12 (SUTURE) ×1 IMPLANT
SUT VIC AB 3-0 SH 27 (SUTURE) ×1
SUT VIC AB 3-0 SH 27X BRD (SUTURE) ×1 IMPLANT
SUT VICRYL 4-0 PS2 18IN ABS (SUTURE) ×2 IMPLANT
SYR 3ML LL SCALE MARK (SYRINGE) ×2 IMPLANT
UNDERPAD 30X30 INCONTINENT (UNDERPADS AND DIAPERS) ×2 IMPLANT
WATER STERILE IRR 1000ML POUR (IV SOLUTION) ×2 IMPLANT

## 2015-12-17 NOTE — H&P (Signed)
Referred by:  Dr. Jimmy Footman   Reason for referral: New access  History of Present Illness  [name] Downs is a 50 y.o. (07-01-1966) male who presents for evaluation for permanent access. The patient is left hand dominant. The patient has not had previous access procedures. Previous central venous cannulation procedures include: none. The patient has never had a PPM placed.   Past Medical History  Diagnosis Date  . Brain aneurysm   . Aortic dissection (HCC)     type B  . Hypertension   . Medically noncompliant     Past Surgical History  Procedure Laterality Date  . Back surgery      Social History   Social History  . Marital Status: Single    Spouse Name: N/A  . Number of Children: N/A  . Years of Education: N/A   Occupational History  . Not on file.   Social History Main Topics  . Smoking status: Current Every Day Smoker -- 1.00 packs/day for 20 years    Types: Cigarettes  . Smokeless tobacco: Former Systems developer    Types: Chew  . Alcohol Use: No  . Drug Use: No  . Sexual Activity: Not on file   Other Topics Concern  . Not on file   Social History Narrative     Family History  Problem Relation Age of Onset  . Alzheimer's disease Mother   . Cirrhosis Father   . Heart attack Brother     Current Outpatient Prescriptions  Medication Sig Dispense Refill  . allopurinol (ZYLOPRIM) 100 MG tablet Take 1 tablet (100 mg total) by mouth 2 (two) times daily. 60 tablet 6  . amLODipine (NORVASC) 10 MG tablet Take 10 mg by mouth daily.    . calcitRIOL (ROCALTROL) 0.25 MCG capsule Take 0.25 mcg by mouth daily.    . furosemide (LASIX) 20 MG tablet Take 20 mg by mouth.    . hydrALAZINE (APRESOLINE) 25 MG tablet Take 25 mg by mouth daily.    [name] lisinopril (PRINIVIL,ZESTRIL) 20 MG tablet Take 20 mg by mouth daily.    .  varenicline (CHANTIX CONTINUING MONTH PAK) 1 MG tablet Recommend 0.5 mg qd x 1 week, then 0.5 mg bid for the next 11 weeks 30 tablet 3  . varenicline (CHANTIX STARTING MONTH PAK) 0.5 MG X 11 & 1 MG X 42 tablet Recommend 0.5 mg qd x 1 week, then 0.5 mg bid for the next 11 weeks 30 tablet 0   No current facility-administered medications for this visit.    No Known Allergies  REVIEW OF SYSTEMS: (Positives checked otherwise negative)  CARDIOVASCULAR:  [ ]  chest pain,  [ ]  chest pressure,  [x]  palpitations,  [ ]  shortness of breath when laying flat,  [ ]  shortness of breath with exertion,  [ ]  pain in feet when walking,  [ ]  pain in feet when laying flat, [ ]  history of blood clot in veins (DVT),  [ ]  history of phlebitis,  [ ]  swelling in legs,  [ ]  varicose veins  PULMONARY:  [x]  productive cough,  [ ]  asthma,  [ ]  wheezing  NEUROLOGIC:  [x]  weakness in arms or legs,  [x]  numbness in arms or legs,  [ ]  difficulty speaking or slurred speech,  [ ]  temporary loss of vision in one eye,  [ ]  dizziness  HEMATOLOGIC:  [ ]  bleeding problems,  [ ]  problems with blood clotting too easily  MUSCULOSKEL:  [ ]  joint pain, [ ]   joint swelling  GASTROINTEST:  [ ]  vomiting blood,  [ ]  blood in stool   GENITOURINARY:  [ ]  burning with urination,  [ ]  blood in urine  PSYCHIATRIC:  [ ]  history of major depression  INTEGUMENTARY:  [ ]  rashes,  [ ]  ulcers  CONSTITUTIONAL:  [ ]  fever,  [ ]  chills   Physical Examination   Filed Vitals:   12/17/15 0743  BP: 144/93  Pulse: 60  Temp: 97.9 F (36.6 C)  TempSrc: Oral  Resp: 20  Height: 6' (1.829 m)  Weight: 246 lb (111.585 kg)  SpO2: 100%     General: A&O x 3, WDWN  Head: [place]/AT Ear/Nose/Throat: Hearing grossly intact, nares w/o erythema or drainage, oropharynx w/o Erythema/Exudate, Mallampati score: 3  Eyes: PERRLA, EOMI  Neck: Supple, no nuchal  rigidity, no palpable LAD  Pulmonary: Sym exp, good air movt, CTAB, no rales, rhonchi, & wheezing  Cardiac: RRR, Nl S1, S2, no Murmurs, rubs or gallops  Vascular: Vessel Right Left  Radial Palpable Palpable  Ulnar Faintly Palpable Faintly Palpable  Brachial Palpable Palpable  Carotid Palpable, without bruit Palpable, without bruit  Aorta Not palpable N/A  Femoral Palpable Palpable  Popliteal Not palpable Not palpable  PT Palpable Palpable  DP Palpable Palpable   Gastrointestinal: soft, NTND, -G/R, - HSM, - masses, - CVAT B  Musculoskeletal: M/S 5/5 throughout , Extremities without ischemic changes   Neurologic: CN 2-12 intact , Pain and light touch intact in extremities , Motor exam as listed above  Psychiatric: Judgment intact, Mood & affect appropriate for pt's clinical situation  Dermatologic: See M/S exam for extremity exam, no rashes otherwise noted  Lymph : No Cervical, Axillary, or Inguinal lymphadenopathy    Non-Invasive Vascular Imaging  Vein Mapping (Date: 11/02/2015):   R arm: acceptable vein conduits include upper arm cephalic and basilic vein  L arm: acceptable vein conduits include entired cephalic, upper arm basilic  BUE Doppler (Date: 11/02/2015):   R arm:   Brachial: tri, 5.3 mm  Radial: tri, 3.3 mm  Ulnar: tri, 2.0 mm  L arm:   Brachial: tri, 5.4 mm  Radial: tri, 2.8 mm  Ulnar: tri, 2.9 mm   Outside Studies/Documentation 10 pages of outside documents were reviewed including: outpatient nephrology chart.   Medical Decision Making  [name] is a 50 y.o. male who presents with chronic kidney disease stage IV    Based on vein mapping and examination, this patient's permanent access options include: R BC AVF (less likely RC AVF), L RC vs BC AVF, B staged BVT  I had an extensive discussion with this patient in regards to the nature of access surgery, including risk, benefits, and alternatives.    The patient is aware that the risks of access surgery include but are not limited to: bleeding, infection, steal syndrome, nerve damage, ischemic monomelic neuropathy, failure of access to mature, and possible need for additional access procedures in the future.  The patient has not agreed to proceed with the above procedure. He is going to attend in education classes and talk it over with his nephrologist first.   Adele Barthel, MD [facility] [place] Office: [phone] Pager: [phone]  11/02/2015, 8:38 AM

## 2015-12-17 NOTE — Op Note (Addendum)
Procedure: Right Radial Cephalic AV fistula   Preop: ESRD   Postop: ESRD   Anesthesia: MAC with local   Assistant: Delena Serve RNFA  Findings: 3 mm cephalic vein       3 mm radial artery   Procedure Details:  The right upper extremity was prepped and draped in usual sterile fashion. Local anesthesia was infiltrated midway between the cephalic and radial artery anatomically. The vein was not palpable due to the patients obesity. Ultrasound was used to identify the location of the vein. A longitudinal skin incision was then made in this location at the distal left forearm. The incision was carried into the subcutaneous tissues down to level of the cephalic vein. The vein had some spasm but was overall reasonable quality accepting a 3 mm dilator. The vein was dissected free circumferentially and small side branches ligated and divided between silk ties. The distal end was ligated and the vein probed and found to accept up to a 3 mm dilator. This was gently distended with heparinized saline, spatulated, and marked for orientation. Next the radial artery was dissected free in the medial portion incision. The artery was 3 mm in diameter but had a reasonable pulse. The vessel loops were placed proximal and distal to the planned site of arteriotomy. The patient was given 5000 units of intravenous heparin. After appropriate circulation time, the vessel loops were used to control the artery. A longitudinal opening was made in the left radial artery. The vein was controlled proximally with a fine bulldog clamp. The vein was then swung over to the artery and sewn end of vein to side of artery using a running 7-0 Prolene suture. Just prior to completion, the anastomosis was fore bled back bled and thoroughly flushed. The anastomosis was secured, vessel loops released, and there was a palpable thrill in the fistula immediately. After hemostasis was obtained, the subcutaneous tissues were reapproximated using a  running 3-0 Vicryl suture. The skin was then closed with a 4 0 Vicryl subcuticular stitch. Dermabond was applied to the skin incision. The patient tolerated the procedure well and there were no complications. Instrument sponge and needle count were correct at the end of the case. The patient was taken to PACU in stable condition.   Ruta Hinds, MD  [facility]  Office: [phone]  Pager: [phone]

## 2015-12-17 NOTE — Anesthesia Preprocedure Evaluation (Addendum)
Anesthesia Evaluation  Patient identified by MRN, date of birth, ID band Patient awake    Reviewed: Allergy & Precautions, H&P , NPO status , Patient's Chart, lab work & pertinent test results  History of Anesthesia Complications Negative for: history of anesthetic complications  Airway Mallampati: IV  TM Distance: >3 FB Neck ROM: full    Dental  (+) Poor Dentition, Loose, Dental Advisory Given,    Pulmonary sleep apnea and Continuous Positive Airway Pressure Ventilation , Current Smoker,    Pulmonary exam normal breath sounds clear to auscultation       Cardiovascular hypertension, + Peripheral Vascular Disease  Normal cardiovascular exam Rhythm:regular Rate:Normal  Negative stress test 2015 EF 55%, mild MR   Neuro/Psych negative neurological ROS     GI/Hepatic negative GI ROS, Neg liver ROS,   Endo/Other  negative endocrine ROS  Renal/GU CRFRenal disease     Musculoskeletal   Abdominal   Peds  Hematology negative hematology ROS (+)   Anesthesia Other Findings Day of surgery labs reviewed and wnml Denies chest pain, shortness of breath with exertion, METS>4  Reproductive/Obstetrics negative OB ROS                          Anesthesia Physical Anesthesia Plan  ASA: III  Anesthesia Plan: MAC   Post-op Pain Management:    Induction: Intravenous  Airway Management Planned: Simple Face Mask  Additional Equipment:   Intra-op Plan:   Post-operative Plan:   Informed Consent: I have reviewed the patients History and Physical, chart, labs and discussed the procedure including the risks, benefits and alternatives for the proposed anesthesia with the patient or authorized representative who has indicated his/her understanding and acceptance.   Dental Advisory Given  Plan Discussed with: Anesthesiologist, CRNA and Surgeon  Anesthesia Plan Comments:         Anesthesia Quick  Evaluation

## 2015-12-17 NOTE — Discharge Instructions (Signed)
° ° °  12/17/2015 Dorn Karp [id number] [birth year]  Surgeon(s): Elam Dutch, MD  Procedure(s): CREATION OF RADIOCEPHALIC  ARTERIOVENOUS (AV) FISTULA RIGHT ARM  x Do not stick fistula for 12 weeks

## 2015-12-17 NOTE — Progress Notes (Signed)
No bruit heard with doppler no thrill palpated dr Oneida Alar notifed will keep pt here till he gets out of surgery

## 2015-12-17 NOTE — Progress Notes (Signed)
Pt fistula has occluded in PACU Will return to OR for placement of brachial cephalic AVF  Ruta Hinds, MD [facility] [place]: [phone] Pager: [phone]

## 2015-12-17 NOTE — Transfer of Care (Signed)
Immediate Anesthesia Transfer of Care Note  Patient: [name] Downs  Procedure(s) Performed: Procedure(s): CREATION OF RIGHT UPPER ARM  BRACHIO CEPHALIC ARTERIOVENOUS (AV) FISTULA (Right)  Patient Location: PACU  Anesthesia Type:MAC  Level of Consciousness: awake, alert  and oriented  Airway & Oxygen Therapy: Patient Spontanous Breathing  Post-op Assessment: Report given to RN and Post -op Vital signs reviewed and stable  Post vital signs: Reviewed and stable  Last Vitals:  Filed Vitals:   12/17/15 1345 12/17/15 1400  BP: 124/95   Pulse: 52 52  Temp:    Resp: 17 17    Last Pain:  Filed Vitals:   12/17/15 1409  PainSc: 7       Patients Stated Pain Goal: 3 ([id number])  Complications: No apparent anesthesia complications

## 2015-12-17 NOTE — Transfer of Care (Signed)
Immediate Anesthesia Transfer of Care Note  Patient: [name] Downs  Procedure(s) Performed: Procedure(s): CREATION OF RADIOCEPHALIC  ARTERIOVENOUS (AV) FISTULA RIGHT LOWER  ARM (Right)  Patient Location: PACU  Anesthesia Type:MAC  Level of Consciousness: lethargic  Airway & Oxygen Therapy: Patient Spontanous Breathing  Post-op Assessment: Report given to RN  Post vital signs: Reviewed and stable  Last Vitals:  Filed Vitals:   12/17/15 0743  BP: 144/93  Pulse: 60  Temp: 36.6 C  Resp: 20    Last Pain: There were no vitals filed for this visit.    Patients Stated Pain Goal: 3 ([id number])  Complications: No apparent anesthesia complications

## 2015-12-17 NOTE — Progress Notes (Signed)
Dr fields at bedside unable to palapate thrill or hear bruit decision made to return pt to for clotted fistula

## 2015-12-17 NOTE — Op Note (Signed)
Procedure: Right Brachial Cephalic AV fistula  Preop: ESRD  Postop: ESRD  Anesthesia: Local with sedation  Assistant: Silva Bandy PA-c  Findings: 3 mm cephalic vein  Procedure: After obtaining informed consent, the patient was taken to the operating room.  After adequate sedation, the right upper extremity was prepped and draped in usual sterile fashion.  A transverse incision was then made near the antecubital crease the right arm. The incision was carried into the subcutaneous tissues down to level of the cephalic vein. The cephalic vein was approximately 2.5-3 mm in diameter. It was of reasonable quality but uniformly small. This was dissected free circumferentially and small side branches ligated and divided between silk ties or clips. Next the brachial artery was dissected free in the medial portion of the incision. The artery was  3-4 mm in diameter. The vessel loops were placed proximal and distal to the planned site of arteriotomy. The patient was given 5000 units of intravenous heparin. After appropriate circulation time, the vessel loops were used to control the artery. A longitudinal opening was made in the brachial artery.  The vein was ligated distally with a 2-0 silk tie. The vein was controlled proximally with a fine bulldog clamp. The vein was then swung over to the artery and sewn end of vein to side of artery using a running 7-0 Prolene suture. Just prior to completion of the anastomosis, everything was fore bled back bled and thoroughly flushed. The anastomosis was secured, vessel loops released. Initially there was flow in the fistula, however this almost immediately occluded. The patient was given an additional 10,000 units of heparin. Vessel loops were once again used to control the artery proximally and distally after 2 minutes of heparin circulation time. The pre-existing anastomosis was taken down. There was visible thrombus within the vein and the arterial anastomosis. This was  all removed under direct vision. A #3 Fogarty catheter was then passed proximally and distally up and down the brachial artery to make sure there was no further thrombus. There was good arterial inflow and brisk arterial backbleeding. #3 Fogarty catheter was then passed the full length of the cephalic vein and some thrombus was retrieved. The vein was then thoroughly flushed 3 times with heparinized saline and occluded with a fine bulldog clamp. This was then reconstructed using a running 7-0 Prolene suture end of vein to side of artery. At completion of the anastomosis it was forebled backbled and thoroughly flushed. The anastomosis was secured and the vessel loops were released restoring flow into the fistula. There is good flow into the brachial artery proximally and distally. There was Doppler flow into the fistula but there was not really a palpable thrill. I did not believe I could make the anastomosis any better at this point and at this point it did not appear that the patient was forming thrombus again. After hemostasis was obtained, the subcutaneous tissues were reapproximated using a running 3-0 Vicryl suture. The skin was then closed with a 4 Vicryl subcuticular stitch. Dermabond was applied to the skin incision.  The patient had a palpable radial pulse at the end of the case.  If the patient reoccludes in the near future consideration may need to be given for hypercoagulable state workup prior to placing a new arteriovenous access. Also consideration will need to be given for possible central venogram or arteriogram.  Ruta Hinds, MD [facility] [place]: [phone] Pager: [phone]

## 2015-12-17 NOTE — Anesthesia Postprocedure Evaluation (Signed)
Anesthesia Post Note  Patient: [name] Downs  Procedure(s) Performed: Procedure(s) (LRB): CREATION OF RIGHT UPPER ARM  BRACHIO CEPHALIC ARTERIOVENOUS (AV) FISTULA (Right)  Patient location during evaluation: PACU Anesthesia Type: MAC Level of consciousness: awake and alert Pain management: pain level controlled Vital Signs Assessment: post-procedure vital signs reviewed and stable Respiratory status: spontaneous breathing, nonlabored ventilation, respiratory function stable and patient connected to nasal cannula oxygen Cardiovascular status: stable and blood pressure returned to baseline Anesthetic complications: no    Last Vitals:  Filed Vitals:   12/17/15 1613 12/17/15 1626  BP:  129/89  Pulse:    Temp: 36.4 C   Resp:      Last Pain:  Filed Vitals:   12/17/15 1633  PainSc: 7                  Zenaida Deed

## 2015-12-17 NOTE — Progress Notes (Signed)
Pt returned to or due to clotted fistula report given to dr Suezanne Jacquet judd pt cont to c/o right arm throbbing after pain med given Unable to reach daughter by phone ti notify her of pt returning to surgery.Marland KitchenMarland Kitchen

## 2015-12-17 NOTE — Anesthesia Preprocedure Evaluation (Signed)
Anesthesia Evaluation  Patient identified by MRN, date of birth, ID band Patient awake    Reviewed: Allergy & Precautions, H&P , NPO status , Patient's Chart, lab work & pertinent test results  History of Anesthesia Complications Negative for: history of anesthetic complications  Airway Mallampati: IV  TM Distance: >3 FB Neck ROM: full    Dental  (+) Poor Dentition, Loose, Dental Advisory Given,    Pulmonary sleep apnea and Continuous Positive Airway Pressure Ventilation , Current Smoker,    Pulmonary exam normal breath sounds clear to auscultation       Cardiovascular hypertension, + Peripheral Vascular Disease  Normal cardiovascular exam Rhythm:regular Rate:Normal  Negative stress test 2015 EF 55%, mild MR   Neuro/Psych negative neurological ROS     GI/Hepatic negative GI ROS, Neg liver ROS,   Endo/Other  negative endocrine ROS  Renal/GU CRFRenal disease     Musculoskeletal   Abdominal   Peds  Hematology negative hematology ROS (+)   Anesthesia Other Findings Day of surgery labs reviewed and wnml Denies chest pain, shortness of breath with exertion, METS>4  Reproductive/Obstetrics negative OB ROS                             Anesthesia Physical  Anesthesia Plan  ASA: III  Anesthesia Plan: General and MAC   Post-op Pain Management:    Induction: Intravenous  Airway Management Planned:   Additional Equipment:   Intra-op Plan:   Post-operative Plan:   Informed Consent: I have reviewed the patients History and Physical, chart, labs and discussed the procedure including the risks, benefits and alternatives for the proposed anesthesia with the patient or authorized representative who has indicated his/her understanding and acceptance.   Dental Advisory Given  Plan Discussed with: Anesthesiologist, CRNA and Surgeon  Anesthesia Plan Comments:         Anesthesia Quick  Evaluation

## 2015-12-17 NOTE — Anesthesia Postprocedure Evaluation (Signed)
Anesthesia Post Note  Patient: Leiam Rittenhouse  Procedure(s) Performed: Procedure(s) (LRB): CREATION OF RADIOCEPHALIC  ARTERIOVENOUS (AV) FISTULA RIGHT LOWER  ARM (Right)  Patient location during evaluation: PACU Anesthesia Type: General Level of consciousness: awake and alert Pain management: pain level controlled Vital Signs Assessment: post-procedure vital signs reviewed and stable Respiratory status: spontaneous breathing, nonlabored ventilation, respiratory function stable and patient connected to nasal cannula oxygen Cardiovascular status: blood pressure returned to baseline and stable Postop Assessment: no signs of nausea or vomiting Anesthetic complications: no Comments: Mr. Ruddock will need to return to OR given signs of decreased blood flow to his hand on operative side. He is doing great from a cardiac and pulmonary standpoint    Last Vitals:  Filed Vitals:   12/17/15 1345 12/17/15 1400  BP: 124/95   Pulse: 52 52  Temp:    Resp: 17 17    Last Pain:  Filed Vitals:   12/17/15 1409  PainSc: 7                  Zenaida Deed

## 2015-12-18 ENCOUNTER — Telehealth: Payer: Self-pay | Admitting: Vascular Surgery

## 2015-12-18 ENCOUNTER — Encounter (HOSPITAL_COMMUNITY): Payer: Self-pay

## 2015-12-18 ENCOUNTER — Emergency Department (HOSPITAL_COMMUNITY)
Admission: EM | Admit: 2015-12-18 | Discharge: 2015-12-18 | Disposition: A | Discharge status: Home / Self Care | Payer: Commercial Managed Care - HMO | Attending: Emergency Medicine | Admitting: Emergency Medicine

## 2015-12-18 DIAGNOSIS — Z7951 Long term (current) use of inhaled steroids: Secondary | ICD-10-CM | POA: Diagnosis not present

## 2015-12-18 DIAGNOSIS — M109 Gout, unspecified: Secondary | ICD-10-CM | POA: Diagnosis not present

## 2015-12-18 DIAGNOSIS — Y658 Other specified misadventures during surgical and medical care: Secondary | ICD-10-CM | POA: Diagnosis not present

## 2015-12-18 DIAGNOSIS — L7682 Other postprocedural complications of skin and subcutaneous tissue: Secondary | ICD-10-CM | POA: Insufficient documentation

## 2015-12-18 DIAGNOSIS — H9192 Unspecified hearing loss, left ear: Secondary | ICD-10-CM | POA: Insufficient documentation

## 2015-12-18 DIAGNOSIS — G8918 Other acute postprocedural pain: Secondary | ICD-10-CM | POA: Diagnosis not present

## 2015-12-18 DIAGNOSIS — Z9119 Patient's noncompliance with other medical treatment and regimen: Secondary | ICD-10-CM | POA: Diagnosis not present

## 2015-12-18 DIAGNOSIS — Z79899 Other long term (current) drug therapy: Secondary | ICD-10-CM | POA: Diagnosis not present

## 2015-12-18 DIAGNOSIS — Z9889 Other specified postprocedural states: Secondary | ICD-10-CM | POA: Insufficient documentation

## 2015-12-18 DIAGNOSIS — I10 Essential (primary) hypertension: Secondary | ICD-10-CM | POA: Diagnosis not present

## 2015-12-18 DIAGNOSIS — F17210 Nicotine dependence, cigarettes, uncomplicated: Secondary | ICD-10-CM | POA: Diagnosis not present

## 2015-12-18 DIAGNOSIS — G4730 Sleep apnea, unspecified: Secondary | ICD-10-CM | POA: Insufficient documentation

## 2015-12-18 LAB — CBC WITH DIFFERENTIAL/PLATELET
Basophils Absolute: 0 10*3/uL (ref 0.0–0.1)
Basophils Relative: 0 %
Eosinophils Absolute: 0.2 10*3/uL (ref 0.0–0.7)
Eosinophils Relative: 1 %
HCT: 39.7 % (ref 39.0–52.0)
Hemoglobin: 12.7 g/dL — ABNORMAL LOW (ref 13.0–17.0)
Lymphocytes Relative: 15 %
Lymphs Abs: 1.7 10*3/uL (ref 0.7–4.0)
MCH: 24.1 pg — ABNORMAL LOW (ref 26.0–34.0)
MCHC: 32 g/dL (ref 30.0–36.0)
MCV: 75.5 fL — ABNORMAL LOW (ref 78.0–100.0)
Monocytes Absolute: 0.5 10*3/uL (ref 0.1–1.0)
Monocytes Relative: 4 %
Neutro Abs: 9.2 10*3/uL — ABNORMAL HIGH (ref 1.7–7.7)
Neutrophils Relative %: 79 %
Platelets: 134 10*3/uL — ABNORMAL LOW (ref 150–400)
RBC: 5.26 MIL/uL (ref 4.22–5.81)
RDW: 16.7 % — ABNORMAL HIGH (ref 11.5–15.5)
WBC: 11.6 10*3/uL — ABNORMAL HIGH (ref 4.0–10.5)

## 2015-12-18 LAB — COMPREHENSIVE METABOLIC PANEL
ALT: 14 U/L — ABNORMAL LOW (ref 17–63)
AST: 15 U/L (ref 15–41)
Albumin: 3.5 g/dL (ref 3.5–5.0)
Alkaline Phosphatase: 70 U/L (ref 38–126)
Anion gap: 12 (ref 5–15)
BUN: 40 mg/dL — ABNORMAL HIGH (ref 6–20)
CO2: 18 mmol/L — ABNORMAL LOW (ref 22–32)
Calcium: 9.1 mg/dL (ref 8.9–10.3)
Chloride: 107 mmol/L (ref 101–111)
Creatinine, Ser: 4.35 mg/dL — ABNORMAL HIGH (ref 0.61–1.24)
GFR calc Af Amer: 17 mL/min — ABNORMAL LOW (ref >60)
GFR calc non Af Amer: 14 mL/min — ABNORMAL LOW (ref >60)
Glucose, Bld: 98 mg/dL (ref 65–99)
Potassium: 4.6 mmol/L (ref 3.5–5.1)
Sodium: 137 mmol/L (ref 135–145)
Total Bilirubin: 0.6 mg/dL (ref 0.3–1.2)
Total Protein: 7.1 g/dL (ref 6.5–8.1)

## 2015-12-18 LAB — I-STAT BETA HCG BLOOD, ED (MC, WL, AP ONLY): I-stat hCG, quantitative: <5 m[IU]/mL (ref <5)

## 2015-12-18 LAB — I-STAT CG4 LACTIC ACID, ED: Lactic Acid, Venous: 1.24 mmol/L (ref 0.5–2.0)

## 2015-12-18 MED ORDER — OXYCODONE-ACETAMINOPHEN 5-325 MG PO TABS: 1.0000 | ORAL_TABLET | Freq: Four times a day (QID) | ORAL | Status: DC | PRN

## 2015-12-18 NOTE — ED Notes (Signed)
Pt roomed, walked from waiting room and slightly SOB, sats 98% on room air though. Pt had R Arm fistula placed in outpatient procedure yesterday and noticed swelling/warmth at site. Today, swelling has increased. Afebrile, VSS, pt a&ox4.

## 2015-12-18 NOTE — Consult Note (Signed)
[facility] [place]  Subjective  - right arm swollen   Objective 141/90 67 98.1 F (36.7 C) (Oral) 16 98% No intake or output data in the 24 hours ending 12/18/15 1409  Right arm + thrill upper arm fistula Right arm diffusely swollen but mostly in dependent area of elbow Incisions at wrist antecub intact  Assessment/Planning: Possible hematoma versus element of venous hypertension No focal collection to drain swelling is diffuse Arm elevation  Follow up with me in 2 weeks Percocet #20 dispensed If swelling becomes worse will explore arm  Ruta Hinds 12/18/2015 2:09 PM --  Laboratory Lab Results:  Recent Labs  12/17/15 0802 12/18/15 1124  WBC  --  11.6*  HGB 14.6 12.7*  HCT 43.0 39.7  PLT  --  134*   BMET  Recent Labs  12/17/15 0802 12/18/15 1124  NA 144 137  K 4.6 4.6  CL  --  107  CO2  --  18*  GLUCOSE 95 98  BUN  --  40*  CREATININE  --  4.35*  CALCIUM  --  9.1    COAG Lab Results  Component Value Date   INR 1.1 12/31/2007   INR 1.1 03/05/2007   No results found for: PTT

## 2015-12-18 NOTE — Telephone Encounter (Signed)
sched lab for 02/21/16 at 11:30 and md for 02/28/16 at 3:45. Ph# not accepting calls, mailed appt letters through regular mail to inform pt of appts.

## 2015-12-18 NOTE — ED Provider Notes (Signed)
CSN: [id number]     Arrival date & time 12/18/15  1033 History   First MD Initiated Contact with Patient 12/18/15 1335     Chief Complaint  Patient presents with  . dialysis fistula site redness      HPI  patient presents with pain and some swelling at his right AV fistula site. It was placed yesterday. He was placed by Dr. Oneida Alar. Has some pain. Doing well otherwise. He is not yet on dialysis. Reportedly had the graft declot twice during the procedure. Pain is dull and constant. Worse with movement.   Past Medical History  Diagnosis Date  . Brain aneurysm   . Aortic dissection (HCC)     type B  . Hypertension   . Medically noncompliant   . Gout   . Deafness in left ear   . Sleep apnea     wears CPAP   Past Surgical History  Procedure Laterality Date  . Back surgery    . Av fistula placement Right 12/17/2015    Procedure: CREATION OF RADIOCEPHALIC  ARTERIOVENOUS (AV) FISTULA RIGHT LOWER  ARM;  Surgeon: Elam Dutch, MD;  Location: [place];  Service: Vascular;  Laterality: Right;  . Av fistula placement Right 12/17/2015    Procedure: CREATION OF RIGHT UPPER ARM  BRACHIO CEPHALIC ARTERIOVENOUS (AV) FISTULA;  Surgeon: Elam Dutch, MD;  Location[facility] OR;  Service: Vascular;  Laterality: Right;   Family History  Problem Relation Age of Onset  . Alzheimer's disease Mother   . Cirrhosis Father   . Heart attack Brother    Social History  Substance Use Topics  . Smoking status: Current Every Day Smoker -- 0.50 packs/day for 20 years    Types: Cigarettes  . Smokeless tobacco: Former Systems developer    Types: Chew  . Alcohol Use: No    Review of Systems  Constitutional: Negative for appetite change and fatigue.  Gastrointestinal: Negative for abdominal pain.  Musculoskeletal: Negative for back pain.  Skin: Positive for color change and wound.  Neurological: Negative for weakness and numbness.      Allergies  Review of patient's allergies indicates no known allergies.  Home  Medications   Prior to Admission medications   Medication Sig Start Date End Date Taking? Authorizing Provider  allopurinol (ZYLOPRIM) 100 MG tablet Take 1 tablet (100 mg total) by mouth 2 (two) times daily. 04/14/14   Mauricia Area, MD  amLODipine (NORVASC) 10 MG tablet Take 10 mg by mouth daily.    Historical Provider, MD  calcitRIOL (ROCALTROL) 0.25 MCG capsule Take 0.25 mcg by mouth daily.    Historical Provider, MD  fluticasone (FLONASE) 50 MCG/ACT nasal spray Place 1 spray into both nostrils daily. 10/23/15   Historical Provider, MD  furosemide (LASIX) 20 MG tablet Take 20 mg by mouth daily.     Historical Provider, MD  hydrALAZINE (APRESOLINE) 25 MG tablet Take 25 mg by mouth daily.    Historical Provider, MD  lisinopril (PRINIVIL,ZESTRIL) 20 MG tablet Take 20 mg by mouth daily.    Historical Provider, MD  loratadine (CLARITIN) 10 MG tablet Take 10 mg by mouth daily.    Historical Provider, MD  oxyCODONE-acetaminophen (PERCOCET) 5-325 MG tablet Take 1-2 tablets by mouth every 6 (six) hours as needed for severe pain. 12/18/15   Elam Dutch, MD   BP 133/86 mmHg  Pulse 62  Temp(Src) 98.1 F (36.7 C) (Oral)  Resp 22  SpO2 99% Physical Exam  Constitutional: He appears well-developed.  Cardiovascular: Regular rhythm.   Abdominal: Soft.  Musculoskeletal: He exhibits tenderness.  Some tenderness and red swelling proximally over the antecubital area at the site of the fistula. Some tenderness also at the distal radial site. Some mild swelling. Has a thrill and has radial pulse. Hand is warm. No drainage.  Skin: Skin is warm. There is erythema.      ED Course  Procedures (including critical care time) Labs Review Labs Reviewed  COMPREHENSIVE METABOLIC PANEL - Abnormal; Notable for the following:    CO2 18 (*)    BUN 40 (*)    Creatinine, Ser 4.35 (*)    ALT 14 (*)    GFR calc non Af Amer 14 (*)    GFR calc Af Amer 17 (*)    All other components within normal limits  CBC WITH  DIFFERENTIAL/PLATELET - Abnormal; Notable for the following:    WBC 11.6 (*)    Hemoglobin 12.7 (*)    MCV 75.5 (*)    MCH 24.1 (*)    RDW 16.7 (*)    Platelets 134 (*)    Neutro Abs 9.2 (*)    All other components within normal limits  I-STAT BETA HCG BLOOD, ED ([facility], [facility], [facility] ONLY)  I-STAT CG4 LACTIC ACID, ED  I-STAT CG4 LACTIC ACID, ED    Imaging Review No results found. I have personally reviewed and evaluated these images and lab results as part of my medical decision-making.   EKG Interpretation None      MDM   Final diagnoses:  Post-operative pain     patient with postoperative pain and swelling after fistula placement yesterday. Seen in the ER by Dr. Oneida Alar. May have small hematoma. Reportedly offered washout or watchful waiting. Patient will wait at this time and was discharged. Prescriptions per Dr. Oneida Alar.    Davonna Belling, MD 12/18/15 1416

## 2015-12-18 NOTE — ED Notes (Signed)
Vascular at bedside

## 2015-12-18 NOTE — Telephone Encounter (Signed)
-----   Message from Mena Goes, RN sent at 12/17/2015  2:16 PM EDT ----- Regarding: schedule   ----- Message -----    From: Gabriel Earing, PA-C    Sent: 12/17/2015  11:15 AM      To: Vvs Charge Pool  S/p right RC AVF by Dr. Oneida Alar.  F/u with Dr. Oneida Alar in 6 weeks with a duplex.  Thanks, Aldona Bar

## 2016-02-20 ENCOUNTER — Ambulatory Visit (HOSPITAL_COMMUNITY)
Admission: RE | Admit: 2016-02-20 | Discharge: 2016-02-20 | Disposition: A | Discharge status: Home / Self Care | Payer: Commercial Managed Care - HMO | Source: Ambulatory Visit | Attending: Vascular Surgery | Admitting: Vascular Surgery

## 2016-02-20 DIAGNOSIS — I120 Hypertensive chronic kidney disease with stage 5 chronic kidney disease or end stage renal disease: Secondary | ICD-10-CM | POA: Diagnosis not present

## 2016-02-20 DIAGNOSIS — G4730 Sleep apnea, unspecified: Secondary | ICD-10-CM | POA: Diagnosis not present

## 2016-02-20 DIAGNOSIS — Z4931 Encounter for adequacy testing for hemodialysis: Secondary | ICD-10-CM | POA: Insufficient documentation

## 2016-02-20 DIAGNOSIS — N186 End stage renal disease: Secondary | ICD-10-CM | POA: Insufficient documentation

## 2016-02-21 ENCOUNTER — Ambulatory Visit (HOSPITAL_COMMUNITY): Payer: Commercial Managed Care - HMO

## 2016-02-22 ENCOUNTER — Encounter: Payer: Self-pay | Admitting: Vascular Surgery

## 2016-02-22 DIAGNOSIS — N2581 Secondary hyperparathyroidism of renal origin: Secondary | ICD-10-CM | POA: Diagnosis not present

## 2016-02-22 DIAGNOSIS — R809 Proteinuria, unspecified: Secondary | ICD-10-CM | POA: Diagnosis not present

## 2016-02-22 DIAGNOSIS — R319 Hematuria, unspecified: Secondary | ICD-10-CM | POA: Diagnosis not present

## 2016-02-22 DIAGNOSIS — E669 Obesity, unspecified: Secondary | ICD-10-CM | POA: Diagnosis not present

## 2016-02-22 DIAGNOSIS — I770 Arteriovenous fistula, acquired: Secondary | ICD-10-CM | POA: Diagnosis not present

## 2016-02-22 DIAGNOSIS — E785 Hyperlipidemia, unspecified: Secondary | ICD-10-CM | POA: Diagnosis not present

## 2016-02-22 DIAGNOSIS — N184 Chronic kidney disease, stage 4 (severe): Secondary | ICD-10-CM | POA: Diagnosis not present

## 2016-02-22 DIAGNOSIS — I129 Hypertensive chronic kidney disease with stage 1 through stage 4 chronic kidney disease, or unspecified chronic kidney disease: Secondary | ICD-10-CM | POA: Diagnosis not present

## 2016-02-22 DIAGNOSIS — D631 Anemia in chronic kidney disease: Secondary | ICD-10-CM | POA: Diagnosis not present

## 2016-02-27 DIAGNOSIS — R739 Hyperglycemia, unspecified: Secondary | ICD-10-CM | POA: Diagnosis not present

## 2016-02-27 DIAGNOSIS — M1000 Idiopathic gout, unspecified site: Secondary | ICD-10-CM | POA: Diagnosis not present

## 2016-02-27 DIAGNOSIS — I718 Aortic aneurysm of unspecified site, ruptured: Secondary | ICD-10-CM | POA: Diagnosis not present

## 2016-02-27 DIAGNOSIS — T7849XD Other allergy, subsequent encounter: Secondary | ICD-10-CM | POA: Diagnosis not present

## 2016-02-27 DIAGNOSIS — E669 Obesity, unspecified: Secondary | ICD-10-CM | POA: Diagnosis not present

## 2016-02-27 DIAGNOSIS — I129 Hypertensive chronic kidney disease with stage 1 through stage 4 chronic kidney disease, or unspecified chronic kidney disease: Secondary | ICD-10-CM | POA: Diagnosis not present

## 2016-02-28 ENCOUNTER — Encounter: Payer: Self-pay | Admitting: Vascular Surgery

## 2016-02-28 ENCOUNTER — Ambulatory Visit (INDEPENDENT_AMBULATORY_CARE_PROVIDER_SITE_OTHER): Payer: Commercial Managed Care - HMO | Admitting: Vascular Surgery

## 2016-02-28 VITALS — BP 130/77 | HR 90 | Temp 97.9°F | Resp 20 | Ht 72.0 in | Wt 242.0 lb

## 2016-02-28 DIAGNOSIS — N184 Chronic kidney disease, stage 4 (severe): Secondary | ICD-10-CM

## 2016-02-28 NOTE — Progress Notes (Signed)
Patient is a 50 year old male who returns for follow-up today after placement of a right brachiocephalic AV fistula. This was placed in May of 2017. He reports no numbness or tingling in his hand. He currently is not on hemodialysis.  Physical exam:  Filed Vitals:   02/28/16 1545  BP: 130/77  Pulse: 90  Temp: 97.9 F (36.6 C)  TempSrc: Oral  Resp: 20  Height: 6' (1.829 m)  Weight: 242 lb (109.77 kg)  SpO2: 96%    Right upper extremity: Well-healed radiocephalic fistula incision was some peri-incisional numbness. Patent right upper arm fistula with palpable thrill audible bruit fistula is palpable proximally  Data: Patient had a duplex ultrasound of AV fistula which shows the diameter is 4-8 mm  Assessment: Maturing right brachiocephalic AV fistula.  Plan: The patient was shown how to exercise the fistula today to try to get this to continue to develop. He'll follow-up with a repeat duplex scan in 6 weeks' time.  Ruta Hinds, MD [facility] [place] Office: [phone] Pager: [phone]

## 2016-03-03 ENCOUNTER — Other Ambulatory Visit: Payer: Self-pay | Admitting: *Deleted

## 2016-03-03 DIAGNOSIS — N186 End stage renal disease: Secondary | ICD-10-CM

## 2016-03-03 DIAGNOSIS — Z992 Dependence on renal dialysis: Principal | ICD-10-CM

## 2016-04-07 ENCOUNTER — Encounter: Payer: Self-pay | Admitting: Vascular Surgery

## 2016-04-10 ENCOUNTER — Encounter (HOSPITAL_COMMUNITY): Payer: Commercial Managed Care - HMO

## 2016-04-10 ENCOUNTER — Ambulatory Visit: Payer: Commercial Managed Care - HMO | Admitting: Vascular Surgery

## 2016-04-23 ENCOUNTER — Ambulatory Visit (HOSPITAL_COMMUNITY)
Admission: RE | Admit: 2016-04-23 | Discharge: 2016-04-23 | Disposition: A | Discharge status: Home / Self Care | Payer: Commercial Managed Care - HMO | Source: Ambulatory Visit | Attending: Vascular Surgery | Admitting: Vascular Surgery

## 2016-04-23 DIAGNOSIS — Z992 Dependence on renal dialysis: Secondary | ICD-10-CM | POA: Insufficient documentation

## 2016-04-23 DIAGNOSIS — N186 End stage renal disease: Secondary | ICD-10-CM | POA: Diagnosis not present

## 2016-04-25 ENCOUNTER — Encounter: Payer: Self-pay | Admitting: Vascular Surgery

## 2016-05-01 ENCOUNTER — Encounter: Payer: Self-pay | Admitting: Vascular Surgery

## 2016-05-01 ENCOUNTER — Ambulatory Visit (INDEPENDENT_AMBULATORY_CARE_PROVIDER_SITE_OTHER): Payer: Commercial Managed Care - HMO | Admitting: Vascular Surgery

## 2016-05-01 VITALS — BP 136/89 | HR 50 | Temp 97.5°F | Resp 16 | Ht 72.0 in | Wt 243.0 lb

## 2016-05-01 DIAGNOSIS — N184 Chronic kidney disease, stage 4 (severe): Secondary | ICD-10-CM | POA: Diagnosis not present

## 2016-05-01 NOTE — Progress Notes (Signed)
   [facility]  Patient name: [name] Downs MRN: [id number] DOB: 07/04/1966 Sex: male  REASON FOR VISIT: Check AV fistula  HPI: [name] Downs is a 50 y.o. male who is status post creation of a right brachiocephalic AV fistula and a first 2017. He denies any numbness or tingling in his hand. He currently is not on hemodialysis.  Past Medical History:  Diagnosis Date  . Aortic dissection (HCC)    type B  . Brain aneurysm   . Deafness in left ear   . Gout   . Hypertension   . Medically noncompliant   . Sleep apnea    wears CPAP    Family History  Problem Relation Age of Onset  . Alzheimer's disease Mother   . Cirrhosis Father   . Heart attack Brother     SOCIAL HISTORY: Social History  Substance Use Topics  . Smoking status: Current Every Day Smoker    Packs/day: 0.10    Years: 20.00    Types: Cigarettes  . Smokeless tobacco: Former Systems developer    Types: Chew     Comment: 1/2 pk per day  . Alcohol use No    No Known Allergies  Current Outpatient Prescriptions  Medication Sig Dispense Refill  . allopurinol (ZYLOPRIM) 100 MG tablet Take 1 tablet (100 mg total) by mouth 2 (two) times daily. 60 tablet 6  . amLODipine (NORVASC) 10 MG tablet Take 10 mg by mouth daily.    . calcitRIOL (ROCALTROL) 0.25 MCG capsule Take 0.25 mcg by mouth daily.    . fluticasone (FLONASE) 50 MCG/ACT nasal spray Place 1 spray into both nostrils daily.  1  . furosemide (LASIX) 20 MG tablet Take 20 mg by mouth daily.     . hydrALAZINE (APRESOLINE) 25 MG tablet Take 25 mg by mouth daily.    [name] lisinopril (PRINIVIL,ZESTRIL) 20 MG tablet Take 20 mg by mouth daily.    [name] loratadine (CLARITIN) 10 MG tablet Take 10 mg by mouth daily.     No current facility-administered medications for this visit.     ROS:   General:  No weight loss, Fever, chills  Cardiac: No recent episodes of chest pain/pressure, no shortness of breath at rest.  No  shortness of breath with exertion.  Denies history of atrial fibrillation or irregular heartbeat  Pulmonary: No home oxygen, no productive cough, no hemoptysis,  No asthma or wheezing   PHYSICAL EXAM: Vitals:   05/01/16 0943  BP: 136/89  Pulse: (!) 50  Resp: 16  Temp: 97.5 F (36.4 C)  TempSrc: Oral  SpO2: 98%  Weight: 243 lb (110.2 kg)  Height: 6' (1.829 m)    VASCULAR: Distal is palpable in the right upper arm. Audible bruit  DATA:  Duplex ultrasound of AV fistula performed on September 6 shows the fistula is 7 mm in diameter 3 mm in depth  Assessment: Maturing fistula right arm  Plan: Fistula should be ready to cannulate by October if the patient requires dialysis. Will follow-up with me on an as-needed basis.   Jessy Oto. Sinahi Knights, MD [facility] Tel [phone] Pager [phone]

## 2016-05-20 DIAGNOSIS — I770 Arteriovenous fistula, acquired: Secondary | ICD-10-CM | POA: Diagnosis not present

## 2016-05-20 DIAGNOSIS — N184 Chronic kidney disease, stage 4 (severe): Secondary | ICD-10-CM | POA: Diagnosis not present

## 2016-05-20 DIAGNOSIS — N2581 Secondary hyperparathyroidism of renal origin: Secondary | ICD-10-CM | POA: Diagnosis not present

## 2016-05-20 DIAGNOSIS — I129 Hypertensive chronic kidney disease with stage 1 through stage 4 chronic kidney disease, or unspecified chronic kidney disease: Secondary | ICD-10-CM | POA: Diagnosis not present

## 2016-05-20 DIAGNOSIS — M109 Gout, unspecified: Secondary | ICD-10-CM | POA: Diagnosis not present

## 2016-05-20 DIAGNOSIS — R809 Proteinuria, unspecified: Secondary | ICD-10-CM | POA: Diagnosis not present

## 2016-05-20 DIAGNOSIS — D631 Anemia in chronic kidney disease: Secondary | ICD-10-CM | POA: Diagnosis not present

## 2016-05-20 DIAGNOSIS — R319 Hematuria, unspecified: Secondary | ICD-10-CM | POA: Diagnosis not present

## 2016-05-20 DIAGNOSIS — Z23 Encounter for immunization: Secondary | ICD-10-CM | POA: Diagnosis not present

## 2016-08-27 ENCOUNTER — Other Ambulatory Visit: Payer: Self-pay | Admitting: Family Medicine

## 2016-08-27 ENCOUNTER — Ambulatory Visit
Admission: RE | Admit: 2016-08-27 | Discharge: 2016-08-27 | Disposition: A | Discharge status: Home / Self Care | Payer: Medicare Other | Source: Ambulatory Visit | Attending: Family Medicine | Admitting: Family Medicine

## 2016-08-27 DIAGNOSIS — M130 Polyarthritis, unspecified: Secondary | ICD-10-CM

## 2016-08-27 DIAGNOSIS — M1000 Idiopathic gout, unspecified site: Secondary | ICD-10-CM | POA: Diagnosis not present

## 2016-08-27 DIAGNOSIS — M25511 Pain in right shoulder: Secondary | ICD-10-CM | POA: Diagnosis not present

## 2016-08-27 DIAGNOSIS — M10019 Idiopathic gout, unspecified shoulder: Secondary | ICD-10-CM

## 2016-08-27 DIAGNOSIS — I129 Hypertensive chronic kidney disease with stage 1 through stage 4 chronic kidney disease, or unspecified chronic kidney disease: Secondary | ICD-10-CM | POA: Diagnosis not present

## 2016-09-09 DIAGNOSIS — N189 Chronic kidney disease, unspecified: Secondary | ICD-10-CM | POA: Diagnosis not present

## 2016-10-01 DIAGNOSIS — R809 Proteinuria, unspecified: Secondary | ICD-10-CM | POA: Diagnosis not present

## 2016-10-01 DIAGNOSIS — N184 Chronic kidney disease, stage 4 (severe): Secondary | ICD-10-CM | POA: Diagnosis not present

## 2016-10-01 DIAGNOSIS — I129 Hypertensive chronic kidney disease with stage 1 through stage 4 chronic kidney disease, or unspecified chronic kidney disease: Secondary | ICD-10-CM | POA: Diagnosis not present

## 2016-10-01 DIAGNOSIS — D631 Anemia in chronic kidney disease: Secondary | ICD-10-CM | POA: Diagnosis not present

## 2016-10-01 DIAGNOSIS — N2581 Secondary hyperparathyroidism of renal origin: Secondary | ICD-10-CM | POA: Diagnosis not present

## 2016-10-10 DIAGNOSIS — R7309 Other abnormal glucose: Secondary | ICD-10-CM | POA: Diagnosis not present

## 2016-10-10 DIAGNOSIS — M1000 Idiopathic gout, unspecified site: Secondary | ICD-10-CM | POA: Diagnosis not present

## 2016-10-10 DIAGNOSIS — I129 Hypertensive chronic kidney disease with stage 1 through stage 4 chronic kidney disease, or unspecified chronic kidney disease: Secondary | ICD-10-CM | POA: Diagnosis not present

## 2016-10-10 DIAGNOSIS — J301 Allergic rhinitis due to pollen: Secondary | ICD-10-CM | POA: Diagnosis not present

## 2017-01-08 DIAGNOSIS — M1000 Idiopathic gout, unspecified site: Secondary | ICD-10-CM | POA: Diagnosis not present

## 2017-01-08 DIAGNOSIS — I129 Hypertensive chronic kidney disease with stage 1 through stage 4 chronic kidney disease, or unspecified chronic kidney disease: Secondary | ICD-10-CM | POA: Diagnosis not present

## 2017-01-08 DIAGNOSIS — R739 Hyperglycemia, unspecified: Secondary | ICD-10-CM | POA: Diagnosis not present

## 2017-01-08 DIAGNOSIS — M130 Polyarthritis, unspecified: Secondary | ICD-10-CM | POA: Diagnosis not present

## 2017-01-08 DIAGNOSIS — J301 Allergic rhinitis due to pollen: Secondary | ICD-10-CM | POA: Diagnosis not present

## 2017-02-02 DIAGNOSIS — N184 Chronic kidney disease, stage 4 (severe): Secondary | ICD-10-CM | POA: Diagnosis not present

## 2017-02-02 DIAGNOSIS — I129 Hypertensive chronic kidney disease with stage 1 through stage 4 chronic kidney disease, or unspecified chronic kidney disease: Secondary | ICD-10-CM | POA: Diagnosis not present

## 2017-02-02 DIAGNOSIS — R809 Proteinuria, unspecified: Secondary | ICD-10-CM | POA: Diagnosis not present

## 2017-02-02 DIAGNOSIS — D631 Anemia in chronic kidney disease: Secondary | ICD-10-CM | POA: Diagnosis not present

## 2017-02-02 DIAGNOSIS — N2581 Secondary hyperparathyroidism of renal origin: Secondary | ICD-10-CM | POA: Diagnosis not present

## 2017-05-12 ENCOUNTER — Other Ambulatory Visit: Payer: Self-pay

## 2017-05-12 DIAGNOSIS — I129 Hypertensive chronic kidney disease with stage 1 through stage 4 chronic kidney disease, or unspecified chronic kidney disease: Secondary | ICD-10-CM | POA: Diagnosis not present

## 2017-05-12 DIAGNOSIS — I10 Essential (primary) hypertension: Secondary | ICD-10-CM | POA: Diagnosis not present

## 2017-05-12 DIAGNOSIS — Z23 Encounter for immunization: Secondary | ICD-10-CM | POA: Diagnosis not present

## 2017-05-12 NOTE — Patient Outreach (Signed)
[facility]) Care Management  05/12/2017  [name] Dec 13, 1965 [id number]   Medication Adherence call to [name] the reason for this call is because [name] is showing past due under [place] on his lisinopril 20 mg spoke to patient he said he is waiting for mail order to arrive he already order his medication and  has enough until the end of the month.   [place] Management Direct Dial [phone]  Fax [phone] [name]

## 2017-06-06 DIAGNOSIS — I129 Hypertensive chronic kidney disease with stage 1 through stage 4 chronic kidney disease, or unspecified chronic kidney disease: Secondary | ICD-10-CM | POA: Diagnosis not present

## 2017-06-10 DIAGNOSIS — D631 Anemia in chronic kidney disease: Secondary | ICD-10-CM | POA: Diagnosis not present

## 2017-06-10 DIAGNOSIS — M109 Gout, unspecified: Secondary | ICD-10-CM | POA: Diagnosis not present

## 2017-06-10 DIAGNOSIS — I129 Hypertensive chronic kidney disease with stage 1 through stage 4 chronic kidney disease, or unspecified chronic kidney disease: Secondary | ICD-10-CM | POA: Diagnosis not present

## 2017-06-10 DIAGNOSIS — N2581 Secondary hyperparathyroidism of renal origin: Secondary | ICD-10-CM | POA: Diagnosis not present

## 2017-06-10 DIAGNOSIS — I770 Arteriovenous fistula, acquired: Secondary | ICD-10-CM | POA: Diagnosis not present

## 2017-06-10 DIAGNOSIS — N184 Chronic kidney disease, stage 4 (severe): Secondary | ICD-10-CM | POA: Diagnosis not present

## 2017-07-07 ENCOUNTER — Other Ambulatory Visit: Payer: Self-pay

## 2017-07-07 DIAGNOSIS — N185 Chronic kidney disease, stage 5: Secondary | ICD-10-CM

## 2017-08-27 ENCOUNTER — Encounter: Payer: Self-pay | Admitting: Vascular Surgery

## 2017-08-27 ENCOUNTER — Ambulatory Visit (INDEPENDENT_AMBULATORY_CARE_PROVIDER_SITE_OTHER)
Admission: RE | Admit: 2017-08-27 | Discharge: 2017-08-27 | Disposition: A | Discharge status: Home / Self Care | Payer: Medicare Other | Source: Ambulatory Visit | Attending: Vascular Surgery | Admitting: Vascular Surgery

## 2017-08-27 ENCOUNTER — Other Ambulatory Visit: Payer: Self-pay | Admitting: *Deleted

## 2017-08-27 ENCOUNTER — Encounter: Payer: Self-pay | Admitting: *Deleted

## 2017-08-27 ENCOUNTER — Ambulatory Visit: Payer: Medicare Other | Admitting: Vascular Surgery

## 2017-08-27 ENCOUNTER — Ambulatory Visit (HOSPITAL_COMMUNITY)
Admission: RE | Admit: 2017-08-27 | Discharge: 2017-08-27 | Disposition: A | Discharge status: Home / Self Care | Payer: Medicare Other | Source: Ambulatory Visit | Attending: Vascular Surgery | Admitting: Vascular Surgery

## 2017-08-27 VITALS — BP 135/89 | HR 54 | Temp 97.4°F | Resp 20 | Ht 72.0 in | Wt 248.0 lb

## 2017-08-27 DIAGNOSIS — Z01818 Encounter for other preprocedural examination: Secondary | ICD-10-CM | POA: Diagnosis not present

## 2017-08-27 DIAGNOSIS — R9389 Abnormal findings on diagnostic imaging of other specified body structures: Secondary | ICD-10-CM | POA: Diagnosis not present

## 2017-08-27 DIAGNOSIS — N185 Chronic kidney disease, stage 5: Secondary | ICD-10-CM

## 2017-08-27 NOTE — Progress Notes (Signed)
Patient is a 52 year old male who presents today for placement of a long-term hemodialysis access.  He previously had a right brachiocephalic AV fistula created May 2017.  He had a right radiocephalic AV fistula that failed on the day of operation.  He currently is not on hemodialysis.  He is also being considered for renal transplant at [facility].    Review of systems: He denies any shortness of breath or chest pain.  Past Medical History:  Diagnosis Date  . Aortic dissection (HCC)    type B  . Brain aneurysm   . Deafness in left ear   . Gout   . Hypertension   . Medically noncompliant   . Sleep apnea    wears CPAP   Past Surgical History:  Procedure Laterality Date  . AV FISTULA PLACEMENT Right 12/17/2015   Procedure: CREATION OF RADIOCEPHALIC  ARTERIOVENOUS (AV) FISTULA RIGHT LOWER  ARM;  Surgeon: Elam Dutch, MD;  Location: [facility] OR;  Service: Vascular;  Laterality: Right;  . AV FISTULA PLACEMENT Right 12/17/2015   Procedure: CREATION OF RIGHT UPPER ARM  BRACHIO CEPHALIC ARTERIOVENOUS (AV) FISTULA;  Surgeon: Elam Dutch, MD;  Location: [place];  Service: Vascular;  Laterality: Right;  . BACK SURGERY      Current Outpatient Medications on File Prior to Visit  Medication Sig Dispense Refill  . allopurinol (ZYLOPRIM) 100 MG tablet Take 1 tablet (100 mg total) by mouth 2 (two) times daily. 60 tablet 6  . amLODipine (NORVASC) 10 MG tablet Take 10 mg by mouth daily.    . calcitRIOL (ROCALTROL) 0.25 MCG capsule Take 0.25 mcg by mouth daily.    . fluticasone (FLONASE) 50 MCG/ACT nasal spray Place 1 spray into both nostrils daily.  1  . furosemide (LASIX) 20 MG tablet Take 20 mg by mouth daily.     . hydrALAZINE (APRESOLINE) 25 MG tablet Take 25 mg by mouth daily.    [name] lisinopril (PRINIVIL,ZESTRIL) 20 MG tablet Take 20 mg by mouth daily.    [name] loratadine (CLARITIN) 10 MG tablet Take 10 mg by mouth daily.     No current facility-administered medications on file prior to visit.     No  Known Allergies  Physical exam:  Vitals:   08/27/17 1019  BP: 135/89  Pulse: (!) 54  Resp: 20  Temp: (!) 97.4 F (36.3 C)  TempSrc: Oral  SpO2: 97%  Weight: 248 lb (112.5 kg)  Height: 6' (1.829 m)    Extremities: Well-healed right wrist and antecubital incision no audible bruit or palpable thrill, 2+ left radial and brachial pulse  Chest: Clear to auscultation bilaterally  Cardiac: Regular rate and rhythm  Data: Patient had a duplex ultrasound of both upper extremities today.  This showed normal arterial configuration with a thrombosed right brachiocephalic AV fistula.  The basilic vein in the left arm overall is fairly poor quality.  The proximal aspect is 3 mm.  The right basilic vein is 4 mm.  The left cephalic vein in the upper arm is 4 mm.  It is small in the forearm.  Assessment: Patient needs long-term hemodialysis access.  He has had 2 prior access procedures fail in the right arm.  He may be a difficult access patient.  Plan: Left brachiocephalic AV fistula scheduled for September 08, 2017.  Risk benefits possible complications of procedure details including but not limited to bleeding infection non-maturation or thrombosis of the fistula.  He understands and agrees to proceed.  Ruta Hinds, MD  [facility] [place] Office: [phone] Pager: [phone]

## 2017-08-27 NOTE — H&P (View-Only) (Signed)
Patient is a 52 year old male who presents today for placement of a long-term hemodialysis access.  He previously had a right brachiocephalic AV fistula created May 2017.  He had a right radiocephalic AV fistula that failed on the day of operation.  He currently is not on hemodialysis.  He is also being considered for renal transplant at [facility].    Review of systems: He denies any shortness of breath or chest pain.  Past Medical History:  Diagnosis Date  . Aortic dissection (HCC)    type B  . Brain aneurysm   . Deafness in left ear   . Gout   . Hypertension   . Medically noncompliant   . Sleep apnea    wears CPAP   Past Surgical History:  Procedure Laterality Date  . AV FISTULA PLACEMENT Right 12/17/2015   Procedure: CREATION OF RADIOCEPHALIC  ARTERIOVENOUS (AV) FISTULA RIGHT LOWER  ARM;  Surgeon: Elam Dutch, MD;  Location: [facility] OR;  Service: Vascular;  Laterality: Right;  . AV FISTULA PLACEMENT Right 12/17/2015   Procedure: CREATION OF RIGHT UPPER ARM  BRACHIO CEPHALIC ARTERIOVENOUS (AV) FISTULA;  Surgeon: Elam Dutch, MD;  Location: [place];  Service: Vascular;  Laterality: Right;  . BACK SURGERY      Current Outpatient Medications on File Prior to Visit  Medication Sig Dispense Refill  . allopurinol (ZYLOPRIM) 100 MG tablet Take 1 tablet (100 mg total) by mouth 2 (two) times daily. 60 tablet 6  . amLODipine (NORVASC) 10 MG tablet Take 10 mg by mouth daily.    . calcitRIOL (ROCALTROL) 0.25 MCG capsule Take 0.25 mcg by mouth daily.    . fluticasone (FLONASE) 50 MCG/ACT nasal spray Place 1 spray into both nostrils daily.  1  . furosemide (LASIX) 20 MG tablet Take 20 mg by mouth daily.     . hydrALAZINE (APRESOLINE) 25 MG tablet Take 25 mg by mouth daily.    [name] lisinopril (PRINIVIL,ZESTRIL) 20 MG tablet Take 20 mg by mouth daily.    [name] loratadine (CLARITIN) 10 MG tablet Take 10 mg by mouth daily.     No current facility-administered medications on file prior to visit.     No  Known Allergies  Physical exam:  Vitals:   08/27/17 1019  BP: 135/89  Pulse: (!) 54  Resp: 20  Temp: (!) 97.4 F (36.3 C)  TempSrc: Oral  SpO2: 97%  Weight: 248 lb (112.5 kg)  Height: 6' (1.829 m)    Extremities: Well-healed right wrist and antecubital incision no audible bruit or palpable thrill, 2+ left radial and brachial pulse  Chest: Clear to auscultation bilaterally  Cardiac: Regular rate and rhythm  Data: Patient had a duplex ultrasound of both upper extremities today.  This showed normal arterial configuration with a thrombosed right brachiocephalic AV fistula.  The basilic vein in the left arm overall is fairly poor quality.  The proximal aspect is 3 mm.  The right basilic vein is 4 mm.  The left cephalic vein in the upper arm is 4 mm.  It is small in the forearm.  Assessment: Patient needs long-term hemodialysis access.  He has had 2 prior access procedures fail in the right arm.  He may be a difficult access patient.  Plan: Left brachiocephalic AV fistula scheduled for September 08, 2017.  Risk benefits possible complications of procedure details including but not limited to bleeding infection non-maturation or thrombosis of the fistula.  He understands and agrees to proceed.  Ruta Hinds, MD  [facility] [place] Office: [phone] Pager: [phone]

## 2017-09-01 DIAGNOSIS — D631 Anemia in chronic kidney disease: Secondary | ICD-10-CM | POA: Diagnosis not present

## 2017-09-01 DIAGNOSIS — N184 Chronic kidney disease, stage 4 (severe): Secondary | ICD-10-CM | POA: Diagnosis not present

## 2017-09-01 DIAGNOSIS — N2581 Secondary hyperparathyroidism of renal origin: Secondary | ICD-10-CM | POA: Diagnosis not present

## 2017-09-05 ENCOUNTER — Encounter (HOSPITAL_COMMUNITY): Payer: Self-pay | Admitting: *Deleted

## 2017-09-07 ENCOUNTER — Other Ambulatory Visit: Payer: Self-pay | Admitting: *Deleted

## 2017-09-08 ENCOUNTER — Ambulatory Visit (HOSPITAL_COMMUNITY): Payer: Medicare Other | Admitting: Certified Registered Nurse Anesthetist

## 2017-09-08 ENCOUNTER — Encounter (HOSPITAL_COMMUNITY): Payer: Self-pay

## 2017-09-08 ENCOUNTER — Encounter (HOSPITAL_COMMUNITY)
Admission: RE | Disposition: A | Discharge status: Home / Self Care | Payer: Self-pay | Source: Ambulatory Visit | Attending: Vascular Surgery

## 2017-09-08 ENCOUNTER — Ambulatory Visit (HOSPITAL_COMMUNITY)
Admission: RE | Admit: 2017-09-08 | Discharge: 2017-09-08 | Disposition: A | Discharge status: Home / Self Care | Payer: Medicare Other | Source: Ambulatory Visit | Attending: Vascular Surgery | Admitting: Vascular Surgery

## 2017-09-08 DIAGNOSIS — N186 End stage renal disease: Secondary | ICD-10-CM | POA: Diagnosis not present

## 2017-09-08 DIAGNOSIS — I129 Hypertensive chronic kidney disease with stage 1 through stage 4 chronic kidney disease, or unspecified chronic kidney disease: Secondary | ICD-10-CM | POA: Diagnosis not present

## 2017-09-08 DIAGNOSIS — Z8679 Personal history of other diseases of the circulatory system: Secondary | ICD-10-CM | POA: Diagnosis not present

## 2017-09-08 DIAGNOSIS — Z9114 Patient's other noncompliance with medication regimen: Secondary | ICD-10-CM | POA: Insufficient documentation

## 2017-09-08 DIAGNOSIS — I10 Essential (primary) hypertension: Secondary | ICD-10-CM | POA: Diagnosis not present

## 2017-09-08 DIAGNOSIS — Z992 Dependence on renal dialysis: Secondary | ICD-10-CM | POA: Diagnosis not present

## 2017-09-08 DIAGNOSIS — I120 Hypertensive chronic kidney disease with stage 5 chronic kidney disease or end stage renal disease: Secondary | ICD-10-CM | POA: Diagnosis not present

## 2017-09-08 DIAGNOSIS — G4730 Sleep apnea, unspecified: Secondary | ICD-10-CM | POA: Insufficient documentation

## 2017-09-08 DIAGNOSIS — M109 Gout, unspecified: Secondary | ICD-10-CM | POA: Diagnosis not present

## 2017-09-08 DIAGNOSIS — N185 Chronic kidney disease, stage 5: Secondary | ICD-10-CM

## 2017-09-08 DIAGNOSIS — Z7682 Awaiting organ transplant status: Secondary | ICD-10-CM | POA: Diagnosis not present

## 2017-09-08 DIAGNOSIS — N184 Chronic kidney disease, stage 4 (severe): Secondary | ICD-10-CM | POA: Diagnosis not present

## 2017-09-08 DIAGNOSIS — H9192 Unspecified hearing loss, left ear: Secondary | ICD-10-CM | POA: Insufficient documentation

## 2017-09-08 DIAGNOSIS — Z79899 Other long term (current) drug therapy: Secondary | ICD-10-CM | POA: Diagnosis not present

## 2017-09-08 DIAGNOSIS — I7100 Dissection of unspecified site of aorta: Secondary | ICD-10-CM | POA: Diagnosis not present

## 2017-09-08 HISTORY — DX: Chronic kidney disease, unspecified: N18.9

## 2017-09-08 HISTORY — PX: AV FISTULA PLACEMENT: SHX1204

## 2017-09-08 LAB — CBC
HCT: 38.5 % — ABNORMAL LOW (ref 39.0–52.0)
Hemoglobin: 12.2 g/dL — ABNORMAL LOW (ref 13.0–17.0)
MCH: 23.7 pg — ABNORMAL LOW (ref 26.0–34.0)
MCHC: 31.7 g/dL (ref 30.0–36.0)
MCV: 74.9 fL — ABNORMAL LOW (ref 78.0–100.0)
Platelets: 145 10*3/uL — ABNORMAL LOW (ref 150–400)
RBC: 5.14 MIL/uL (ref 4.22–5.81)
RDW: 16.2 % — ABNORMAL HIGH (ref 11.5–15.5)
WBC: 8.7 10*3/uL (ref 4.0–10.5)

## 2017-09-08 LAB — COMPREHENSIVE METABOLIC PANEL
ALT: 16 U/L — ABNORMAL LOW (ref 17–63)
AST: 20 U/L (ref 15–41)
Albumin: 3.3 g/dL — ABNORMAL LOW (ref 3.5–5.0)
Alkaline Phosphatase: 77 U/L (ref 38–126)
Anion gap: 11 (ref 5–15)
BUN: 51 mg/dL — ABNORMAL HIGH (ref 6–20)
CO2: 18 mmol/L — ABNORMAL LOW (ref 22–32)
Calcium: 8.9 mg/dL (ref 8.9–10.3)
Chloride: 109 mmol/L (ref 101–111)
Creatinine, Ser: 5.78 mg/dL — ABNORMAL HIGH (ref 0.61–1.24)
GFR calc Af Amer: 12 mL/min — ABNORMAL LOW (ref >60)
GFR calc non Af Amer: 10 mL/min — ABNORMAL LOW (ref >60)
Glucose, Bld: 102 mg/dL — ABNORMAL HIGH (ref 65–99)
Potassium: 4.5 mmol/L (ref 3.5–5.1)
Sodium: 138 mmol/L (ref 135–145)
Total Bilirubin: 0.5 mg/dL (ref 0.3–1.2)
Total Protein: 6.9 g/dL (ref 6.5–8.1)

## 2017-09-08 LAB — URINALYSIS, ROUTINE W REFLEX MICROSCOPIC
Bacteria, UA: NONE SEEN
Bilirubin Urine: NEGATIVE
Glucose, UA: NEGATIVE mg/dL
Ketones, ur: NEGATIVE mg/dL
Leukocytes, UA: NEGATIVE
Nitrite: NEGATIVE
Protein, ur: 100 mg/dL — AB
Specific Gravity, Urine: 1.009 (ref 1.005–1.030)
Squamous Epithelial / LPF: NONE SEEN
pH: 5 (ref 5.0–8.0)

## 2017-09-08 SURGERY — ARTERIOVENOUS (AV) FISTULA CREATION
Anesthesia: Monitor Anesthesia Care | Laterality: Left

## 2017-09-08 MED ORDER — ONDANSETRON HCL 4 MG/2ML IJ SOLN: 4.0000 mg | Freq: Once | INTRAMUSCULAR | Status: DC | PRN

## 2017-09-08 MED ORDER — LIDOCAINE HCL (PF) 1 % IJ SOLN
INTRAMUSCULAR | Status: DC | PRN
  Administered 2017-09-08: 9 mL

## 2017-09-08 MED ORDER — SODIUM CHLORIDE 0.9 % IV SOLN
INTRAVENOUS | Status: DC
  Administered 2017-09-08: 09:00:00 via INTRAVENOUS

## 2017-09-08 MED ORDER — OXYCODONE HCL 5 MG PO TABS
ORAL_TABLET | ORAL | Status: AC
  Filled 2017-09-08: qty 1

## 2017-09-08 MED ORDER — HEPARIN SODIUM (PORCINE) 1000 UNIT/ML IJ SOLN
INTRAMUSCULAR | Status: DC | PRN
  Administered 2017-09-08: 5000 [IU] via INTRAVENOUS

## 2017-09-08 MED ORDER — FENTANYL CITRATE (PF) 100 MCG/2ML IJ SOLN
INTRAMUSCULAR | Status: DC | PRN
  Administered 2017-09-08 (×2): 50 ug via INTRAVENOUS

## 2017-09-08 MED ORDER — PROPOFOL 500 MG/50ML IV EMUL
INTRAVENOUS | Status: DC | PRN
  Administered 2017-09-08: 75 ug/kg/min via INTRAVENOUS

## 2017-09-08 MED ORDER — DEXAMETHASONE SODIUM PHOSPHATE 10 MG/ML IJ SOLN
INTRAMUSCULAR | Status: AC
  Filled 2017-09-08: qty 1

## 2017-09-08 MED ORDER — 0.9 % SODIUM CHLORIDE (POUR BTL) OPTIME
TOPICAL | Status: DC | PRN
  Administered 2017-09-08: 1000 mL

## 2017-09-08 MED ORDER — DEXTROSE 5 % IV SOLN
1.5000 g | INTRAVENOUS | Status: AC
  Administered 2017-09-08: 1.5 g via INTRAVENOUS
  Filled 2017-09-08: qty 1.5

## 2017-09-08 MED ORDER — DEXAMETHASONE SODIUM PHOSPHATE 10 MG/ML IJ SOLN
INTRAMUSCULAR | Status: DC | PRN
  Administered 2017-09-08: 10 mg via INTRAVENOUS

## 2017-09-08 MED ORDER — MIDAZOLAM HCL 2 MG/2ML IJ SOLN
INTRAMUSCULAR | Status: AC
  Filled 2017-09-08: qty 2

## 2017-09-08 MED ORDER — ONDANSETRON HCL 4 MG/2ML IJ SOLN
INTRAMUSCULAR | Status: DC | PRN
  Administered 2017-09-08: 4 mg via INTRAVENOUS

## 2017-09-08 MED ORDER — MIDAZOLAM HCL 2 MG/2ML IJ SOLN
INTRAMUSCULAR | Status: DC | PRN
  Administered 2017-09-08: 1 mg via INTRAVENOUS

## 2017-09-08 MED ORDER — LIDOCAINE 2% (20 MG/ML) 5 ML SYRINGE
INTRAMUSCULAR | Status: AC
  Filled 2017-09-08: qty 5

## 2017-09-08 MED ORDER — SODIUM CHLORIDE 0.9 % IV SOLN
INTRAVENOUS | Status: DC | PRN
  Administered 2017-09-08: 500 mL

## 2017-09-08 MED ORDER — ONDANSETRON HCL 4 MG/2ML IJ SOLN
INTRAMUSCULAR | Status: AC
  Filled 2017-09-08: qty 2

## 2017-09-08 MED ORDER — OXYCODONE HCL 5 MG/5ML PO SOLN: 5.0000 mg | Freq: Once | ORAL | Status: AC | PRN

## 2017-09-08 MED ORDER — OXYCODONE-ACETAMINOPHEN 5-325 MG PO TABS: 1.0000 | ORAL_TABLET | Freq: Four times a day (QID) | ORAL | 0 refills | Status: DC | PRN

## 2017-09-08 MED ORDER — FENTANYL CITRATE (PF) 100 MCG/2ML IJ SOLN: 25.0000 ug | INTRAMUSCULAR | Status: DC | PRN

## 2017-09-08 MED ORDER — OXYCODONE HCL 5 MG PO TABS
5.0000 mg | ORAL_TABLET | Freq: Once | ORAL | Status: AC | PRN
  Administered 2017-09-08: 5 mg via ORAL

## 2017-09-08 MED ORDER — LIDOCAINE HCL (PF) 1 % IJ SOLN
INTRAMUSCULAR | Status: AC
  Filled 2017-09-08: qty 30

## 2017-09-08 MED ORDER — PHENYLEPHRINE 40 MCG/ML (10ML) SYRINGE FOR IV PUSH (FOR BLOOD PRESSURE SUPPORT)
PREFILLED_SYRINGE | INTRAVENOUS | Status: DC | PRN
  Administered 2017-09-08 (×3): 80 ug via INTRAVENOUS

## 2017-09-08 MED ORDER — FENTANYL CITRATE (PF) 250 MCG/5ML IJ SOLN
INTRAMUSCULAR | Status: AC
  Filled 2017-09-08: qty 5

## 2017-09-08 MED ORDER — PROPOFOL 10 MG/ML IV BOLUS
INTRAVENOUS | Status: AC
  Filled 2017-09-08: qty 20

## 2017-09-08 SURGICAL SUPPLY — 33 items
ARMBAND PINK RESTRICT EXTREMIT (MISCELLANEOUS) ×4 IMPLANT
CANISTER SUCT 3000ML PPV (MISCELLANEOUS) ×2 IMPLANT
CANNULA VESSEL 3MM 2 BLNT TIP (CANNULA) ×2 IMPLANT
CLIP VESOCCLUDE MED 6/CT (CLIP) ×2 IMPLANT
CLIP VESOCCLUDE SM WIDE 6/CT (CLIP) ×2 IMPLANT
COVER PROBE W GEL 5X96 (DRAPES) IMPLANT
DECANTER SPIKE VIAL GLASS SM (MISCELLANEOUS) ×2 IMPLANT
DERMABOND ADVANCED (GAUZE/BANDAGES/DRESSINGS) ×1
DERMABOND ADVANCED .7 DNX12 (GAUZE/BANDAGES/DRESSINGS) ×1 IMPLANT
DRAIN PENROSE 1/4X12 LTX STRL (WOUND CARE) ×2 IMPLANT
ELECT REM PT RETURN 9FT ADLT (ELECTROSURGICAL) ×2
ELECTRODE REM PT RTRN 9FT ADLT (ELECTROSURGICAL) ×1 IMPLANT
GLOVE BIO SURGEON STRL SZ7.5 (GLOVE) ×2 IMPLANT
GLOVE BIOGEL PI IND STRL 6.5 (GLOVE) ×1 IMPLANT
GLOVE BIOGEL PI INDICATOR 6.5 (GLOVE) ×1
GLOVE ECLIPSE 6.5 STRL STRAW (GLOVE) ×2 IMPLANT
GOWN STRL REUS W/ TWL LRG LVL3 (GOWN DISPOSABLE) ×4 IMPLANT
GOWN STRL REUS W/TWL LRG LVL3 (GOWN DISPOSABLE) ×4
KIT BASIN OR (CUSTOM PROCEDURE TRAY) ×2 IMPLANT
KIT ROOM TURNOVER OR (KITS) ×2 IMPLANT
LOOP VESSEL MINI RED (MISCELLANEOUS) IMPLANT
NS IRRIG 1000ML POUR BTL (IV SOLUTION) ×2 IMPLANT
PACK CV ACCESS (CUSTOM PROCEDURE TRAY) ×2 IMPLANT
PAD ARMBOARD 7.5X6 YLW CONV (MISCELLANEOUS) ×4 IMPLANT
SPONGE SURGIFOAM ABS GEL 100 (HEMOSTASIS) IMPLANT
SUT PROLENE 6 0 BV (SUTURE) ×2 IMPLANT
SUT PROLENE 7 0 BV 1 (SUTURE) ×2 IMPLANT
SUT VIC AB 3-0 SH 27 (SUTURE) ×1
SUT VIC AB 3-0 SH 27X BRD (SUTURE) ×1 IMPLANT
SUT VICRYL 4-0 PS2 18IN ABS (SUTURE) ×2 IMPLANT
TOWEL GREEN STERILE (TOWEL DISPOSABLE) ×2 IMPLANT
UNDERPAD 30X30 (UNDERPADS AND DIAPERS) ×2 IMPLANT
WATER STERILE IRR 1000ML POUR (IV SOLUTION) ×2 IMPLANT

## 2017-09-08 NOTE — Interval H&P Note (Signed)
History and Physical Interval Note:  09/08/2017 9:02 AM  [name] Downs  has presented today for surgery, with the diagnosis of CHRONIC KIDNEY DISEASE STAGE IV  FOR HEMODIALYSIS ACCESS  The various methods of treatment have been discussed with the patient and family. After consideration of risks, benefits and other options for treatment, the patient has consented to  Procedure(s): ARTERIOVENOUS (AV) FISTULA CREATION LEFT BRACHIOCEPHALIC (Left) as a surgical intervention .  The patient's history has been reviewed, patient examined, no change in status, stable for surgery.  I have reviewed the patient's chart and labs.  Questions were answered to the patient's satisfaction.     Ruta Hinds

## 2017-09-08 NOTE — Op Note (Signed)
Procedure: Left Brachial Cephalic AV fistula  Preop: ESRD  Postop: ESRD  Anesthesia: General  Assistant: Leontine Locket PA-C  Findings: 3 mm cephalic vein  Procedure: After obtaining informed consent, the patient was taken to the operating room.  After induction of general anesthesia, the left upper extremity was prepped and draped in usual sterile fashion.  A transverse incision was then made near the antecubital crease the left arm. The incision was carried into the subcutaneous tissues down to level of the cephalic vein. The cephalic vein was approximately 3 mm in diameter. It was of good quality. This was dissected free circumferentially and small side branches ligated and divided between silk ties or clips. Next the brachial artery was dissected free in the medial portion of the incision. The artery was  3-4 mm in diameter. The vessel loops were placed proximal and distal to the planned site of arteriotomy. The patient was given 5000 units of intravenous heparin. After appropriate circulation time, the vessel loops were used to control the artery. A longitudinal opening was made in the brachial artery.  The vein was ligated distally with a 2-0 silk tie. The vein was controlled proximally with a fine bulldog clamp. The vein was then swung over to the artery and sewn end of vein to side of artery using a running 7-0 Prolene suture. Just prior to completion of the anastomosis, everything was fore bled back bled and thoroughly flushed. The anastomosis was secured, vessel loops released, and there was a palpable thrill in the fistula immediately. After hemostasis was obtained, the subcutaneous tissues were reapproximated using a running 3-0 Vicryl suture. The skin was then closed with a 4 0 Vicryl subcuticular stitch. Dermabond was applied to the skin incision.  The patient had a radial doppler signal at the end of the case.  Ruta Hinds, MD [facility] [place] Office:  [phone] Pager: [phone]

## 2017-09-08 NOTE — Transfer of Care (Signed)
Immediate Anesthesia Transfer of Care Note  Patient: [name] Downs  Procedure(s) Performed: ARTERIOVENOUS (AV) FISTULA CREATION LEFT BRACHIOCEPHALIC (Left )  Patient Location: PACU  Anesthesia Type:MAC  Level of Consciousness: awake, alert , oriented and patient cooperative  Airway & Oxygen Therapy: Patient Spontanous Breathing  Post-op Assessment: Report given to RN, Post -op Vital signs reviewed and stable and Patient moving all extremities X 4  Post vital signs: Reviewed and stable  Last Vitals:  Vitals:   09/08/17 0645 09/08/17 1101  BP: 123/80 (P) 106/74  Pulse: (!) 54 (!) (P) 54  Resp: 20 (P) 18  Temp: 36.6 C (P) 36.4 C  SpO2: 97% (P) 93%    Last Pain:  Vitals:   09/08/17 1101  TempSrc:   PainSc: (P) 0-No pain      Patients Stated Pain Goal: 0 (47/65/46 5035)  Complications: No apparent anesthesia complications

## 2017-09-08 NOTE — Progress Notes (Signed)
Pt in PACU, + thrill no signs of steal Will arrange follow up in a few weeks  Ruta Hinds, MD [facility] [place]: [phone] Pager: [phone]

## 2017-09-08 NOTE — Anesthesia Preprocedure Evaluation (Signed)
Anesthesia Evaluation  Patient identified by MRN, date of birth, ID band Patient awake    Reviewed: Allergy & Precautions, NPO status , Patient's Chart, lab work & pertinent test results  Airway Mallampati: III  TM Distance: >3 FB Neck ROM: Full    Dental  (+) Teeth Intact, Dental Advisory Given   Pulmonary Current Smoker,    breath sounds clear to auscultation       Cardiovascular hypertension,  Rhythm:Regular Rate:Normal     Neuro/Psych    GI/Hepatic   Endo/Other    Renal/GU      Musculoskeletal   Abdominal   Peds  Hematology   Anesthesia Other Findings   Reproductive/Obstetrics                             Anesthesia Physical Anesthesia Plan  ASA: III  Anesthesia Plan: MAC   Post-op Pain Management:    Induction: Intravenous  PONV Risk Score and Plan: Ondansetron and Dexamethasone  Airway Management Planned: Natural Airway and Simple Face Mask  Additional Equipment:   Intra-op Plan:   Post-operative Plan:   Informed Consent: I have reviewed the patients History and Physical, chart, labs and discussed the procedure including the risks, benefits and alternatives for the proposed anesthesia with the patient or authorized representative who has indicated his/her understanding and acceptance.   Dental advisory given  Plan Discussed with: CRNA and Anesthesiologist  Anesthesia Plan Comments:         Anesthesia Quick Evaluation

## 2017-09-08 NOTE — Discharge Instructions (Signed)
° °  [facility]  Discharge Instructions  AV Fistula or Graft Surgery for Dialysis Access  Please refer to the following instructions for your post-procedure care. Your surgeon or physician assistant will discuss any changes with you.  Activity  You may drive the day following your surgery, if you are comfortable and no longer taking prescription pain medication. Resume full activity as the soreness in your incision resolves.  Bathing/Showering  You may shower after you go home. Keep your incision dry for 48 hours. Do not soak in a bathtub, hot tub, or swim until the incision heals completely. You may not shower if you have a hemodialysis catheter.  Incision Care  Clean your incision with mild soap and water after 48 hours. Pat the area dry with a clean towel. You do not need a bandage unless otherwise instructed. Do not apply any ointments or creams to your incision. You may have skin glue on your incision. Do not peel it off. It will come off on its own in about one week. Your arm may swell a bit after surgery. To reduce swelling use pillows to elevate your arm so it is above your heart. Your doctor will tell you if you need to lightly wrap your arm with an ACE bandage.  Diet  Resume your normal diet. There are not special food restrictions following this procedure. In order to heal from your surgery, it is CRITICAL to get adequate nutrition. Your body requires vitamins, minerals, and protein. Vegetables are the best source of vitamins and minerals. Vegetables also provide the perfect balance of protein. Processed food has little nutritional value, so try to avoid this.  Medications  Resume taking all of your medications. If your incision is causing pain, you may take over-the counter pain relievers such as acetaminophen (Tylenol). If you were prescribed a stronger pain medication, please be aware these medications can cause nausea and constipation. Prevent  nausea by taking the medication with a snack or meal. Avoid constipation by drinking plenty of fluids and eating foods with high amount of fiber, such as fruits, vegetables, and grains. Do not take Tylenol if you are taking prescription pain medications.  Follow up Your surgeon may want to see you in the office following your access surgery. If so, this will be arranged at the time of your surgery.  Please call us immediately for any of the following conditions:  Increased pain, redness, drainage (pus) from your incision site Fever of 101 degrees or higher Severe or worsening pain at your incision site Hand pain or numbness.  Reduce your risk of vascular disease:  Stop smoking. If you would like help, call [facility] at 1-800-QUIT-NOW (706) 649-8009) or [place] at [place] your cholesterol Maintain a desired weight Control your diabetes Keep your blood pressure down  Dialysis  It will take several weeks to several months for your new dialysis access to be ready for use. Your surgeon will determine when it is OK to use it. Your nephrologist will continue to direct your dialysis. You can continue to use your Permcath until your new access is ready for use.   09/08/2017 [name] Downs [id number] 01/29/1966  Surgeon(s): Fields, Jessy Oto, MD  Procedure(s): ARTERIOVENOUS (AV) FISTULA CREATION LEFT BRACHIOCEPHALIC  x Do not stick fistula for 12 weeks    If you have any questions, please call the office at [phone].

## 2017-09-08 NOTE — Anesthesia Postprocedure Evaluation (Signed)
Anesthesia Post Note  Patient: [name] Downs  Procedure(s) Performed: ARTERIOVENOUS (AV) FISTULA CREATION LEFT BRACHIOCEPHALIC (Left )     Patient location during evaluation: PACU Anesthesia Type: MAC Level of consciousness: awake and alert Pain management: pain level controlled Vital Signs Assessment: post-procedure vital signs reviewed and stable Respiratory status: spontaneous breathing, nonlabored ventilation, respiratory function stable and patient connected to nasal cannula oxygen Cardiovascular status: stable and blood pressure returned to baseline Postop Assessment: no apparent nausea or vomiting Anesthetic complications: no    Last Vitals:  Vitals:   09/08/17 1145 09/08/17 1200  BP: 114/80 122/84  Pulse: (!) 56 64  Resp: 20 18  Temp: 36.7 C   SpO2: 98% 98%    Last Pain:  Vitals:   09/08/17 1200  TempSrc:   PainSc: 3                  Munachimso Palin COKER

## 2017-09-09 ENCOUNTER — Telehealth: Payer: Self-pay | Admitting: Vascular Surgery

## 2017-09-09 ENCOUNTER — Encounter (HOSPITAL_COMMUNITY): Payer: Self-pay | Admitting: Vascular Surgery

## 2017-09-09 NOTE — Telephone Encounter (Signed)
-----   Message from Mena Goes, RN sent at 09/08/2017 10:55 AM EST ----- Regarding: 4-6 weeks w/ duplex   ----- Message ----- From: Gabriel Earing, PA-C Sent: 09/08/2017  10:48 AM To: Vvs Charge Pool  S/p left BC AVF 09/08/17.  F/u with [facility] in 4-6 weeks with duplex.  Thanks

## 2017-09-09 NOTE — Telephone Encounter (Signed)
Sched appt 10/22/17; lab at 3:00 and MD at 4:00. Lm on hm# to inform pt of appt.

## 2017-09-14 ENCOUNTER — Other Ambulatory Visit: Payer: Self-pay

## 2017-09-14 DIAGNOSIS — N184 Chronic kidney disease, stage 4 (severe): Secondary | ICD-10-CM

## 2017-09-16 DIAGNOSIS — N189 Chronic kidney disease, unspecified: Secondary | ICD-10-CM | POA: Diagnosis not present

## 2017-09-16 DIAGNOSIS — I770 Arteriovenous fistula, acquired: Secondary | ICD-10-CM | POA: Diagnosis not present

## 2017-09-16 DIAGNOSIS — N184 Chronic kidney disease, stage 4 (severe): Secondary | ICD-10-CM | POA: Diagnosis not present

## 2017-09-16 DIAGNOSIS — I129 Hypertensive chronic kidney disease with stage 1 through stage 4 chronic kidney disease, or unspecified chronic kidney disease: Secondary | ICD-10-CM | POA: Diagnosis not present

## 2017-09-16 DIAGNOSIS — N2581 Secondary hyperparathyroidism of renal origin: Secondary | ICD-10-CM | POA: Diagnosis not present

## 2017-09-16 DIAGNOSIS — D631 Anemia in chronic kidney disease: Secondary | ICD-10-CM | POA: Diagnosis not present

## 2017-10-05 ENCOUNTER — Ambulatory Visit (HOSPITAL_COMMUNITY)
Admission: EM | Admit: 2017-10-05 | Discharge: 2017-10-05 | Disposition: A | Discharge status: Home / Self Care | Payer: Medicare Other | Attending: Family Medicine | Admitting: Family Medicine

## 2017-10-05 ENCOUNTER — Ambulatory Visit (INDEPENDENT_AMBULATORY_CARE_PROVIDER_SITE_OTHER): Payer: Medicare Other

## 2017-10-05 ENCOUNTER — Encounter (HOSPITAL_COMMUNITY): Payer: Self-pay | Admitting: Emergency Medicine

## 2017-10-05 DIAGNOSIS — R1011 Right upper quadrant pain: Secondary | ICD-10-CM

## 2017-10-05 MED ORDER — HYOSCYAMINE SULFATE SL 0.125 MG SL SUBL: 1.0000 | SUBLINGUAL_TABLET | Freq: Three times a day (TID) | SUBLINGUAL | 1 refills | Status: DC | PRN

## 2017-10-05 NOTE — Discharge Instructions (Signed)
Your x-ray shows quite a bit of gas in the right upper part of your abdomen.  Stay on clear liquids overnight and start the Levsin as prescribed.  If pain worsens, you may need a CAT scan and should go to the Emergency Department if this happens.  Your symptoms should subside by morning.

## 2017-10-05 NOTE — ED Triage Notes (Signed)
PT reports he had a stomach bug a week ago. PT reports RUQ abdominal pain that started a week ago and it has gotten better. PT reports his last BM was today and runny because he's been taking miralax. PT vomited last night. PT sees PCP [place]

## 2017-10-05 NOTE — ED Provider Notes (Signed)
[place]   [id number] 10/05/17 Arrival Time: 1605   SUBJECTIVE:  [name] Downs is a 52 y.o. male who presents to the urgent care with complaint of RUQ abdominal pain that started a week ago and it has gotten better. PT reports his last BM was today and runny because he's been taking miralax. PT vomited last night. PT sees PCP Wednesday  Normally has 2 BM daily  H/O cerebral aneurysm.  Disabled.  Past Medical History:  Diagnosis Date  . Aortic dissection (HCC)    type B  . Brain aneurysm   . Chronic kidney disease   . Deafness in left ear   . Gout   . Hypertension   . Medically noncompliant   . Sleep apnea    wears CPAP   Family History  Problem Relation Age of Onset  . Alzheimer's disease Mother   . Cirrhosis Father   . Heart attack Brother    Social History   Socioeconomic History  . Marital status: Single    Spouse name: Not on file  . Number of children: Not on file  . Years of education: Not on file  . Highest education level: Not on file  Social Needs  . Financial resource strain: Not on file  . Food insecurity - worry: Not on file  . Food insecurity - inability: Not on file  . Transportation needs - medical: Not on file  . Transportation needs - non-medical: Not on file  Occupational History  . Not on file  Tobacco Use  . Smoking status: Current Every Day Smoker    Packs/day: 0.10    Years: 20.00    Pack years: 2.00    Types: Cigarettes  . Smokeless tobacco: Former Systems developer    Types: Chew  . Tobacco comment: 1-2 cigarettes per day.   Substance and Sexual Activity  . Alcohol use: No    Alcohol/week: 0.0 oz  . Drug use: No  . Sexual activity: Not on file  Other Topics Concern  . Not on file  Social History Narrative  . Not on file   No outpatient medications have been marked as taking for the 10/05/17 encounter [facility] Encounter).   No Known Allergies    ROS: As per HPI, remainder of ROS negative.   OBJECTIVE:   Vitals:   10/05/17 1700 10/05/17 1703  BP: (!) 142/88   Pulse: (!) 58   Resp: (!) 28   Temp: 98.3 F (36.8 C)   TempSrc: Oral   SpO2: 98%   Weight:  245 lb (111.1 kg)  Height:  6' (1.829 m)     General appearance: alert; no distress Eyes: PERRL; EOMI; conjunctiva normal HENT: normocephalic; atraumatic; oral mucosa normal Neck: supple Lungs: clear to auscultation bilaterally Heart: regular rate and rhythm Abdomen: soft, tender RUQ and, to a lesser extent, RLQ; bowel sounds normal; no masses or organomegaly; no guarding or rebound tenderness Back: no CVA tenderness Extremities: no cyanosis or edema; symmetrical with no gross deformities Skin: warm and dry Neurologic: normal gait; grossly normal Psychological: alert and cooperative; normal mood and affect      Labs:  Results for orders placed or performed during the hospital encounter of 09/08/17  CBC  Result Value Ref Range   WBC 8.7 4.0 - 10.5 K/uL   RBC 5.14 4.22 - 5.81 MIL/uL   Hemoglobin 12.2 (L) 13.0 - 17.0 g/dL   HCT 38.5 (L) 39.0 - 52.0 %   MCV 74.9 (L) 78.0 - 100.0 fL  MCH 23.7 (L) 26.0 - 34.0 pg   MCHC 31.7 30.0 - 36.0 g/dL   RDW 16.2 (H) 11.5 - 15.5 %   Platelets 145 (L) 150 - 400 K/uL  Urinalysis, Routine w reflex microscopic  Result Value Ref Range   Color, Urine STRAW (A) YELLOW   APPearance CLEAR CLEAR   Specific Gravity, Urine 1.009 1.005 - 1.030   pH 5.0 5.0 - 8.0   Glucose, UA NEGATIVE NEGATIVE mg/dL   Hgb urine dipstick SMALL (A) NEGATIVE   Bilirubin Urine NEGATIVE NEGATIVE   Ketones, ur NEGATIVE NEGATIVE mg/dL   Protein, ur 100 (A) NEGATIVE mg/dL   Nitrite NEGATIVE NEGATIVE   Leukocytes, UA NEGATIVE NEGATIVE   RBC / HPF 0-5 0 - 5 RBC/hpf   WBC, UA 0-5 0 - 5 WBC/hpf   Bacteria, UA NONE SEEN NONE SEEN   Squamous Epithelial / LPF NONE SEEN NONE SEEN   Mucus PRESENT   Comprehensive metabolic panel  Result Value Ref Range   Sodium 138 135 - 145 mmol/L   Potassium 4.5 3.5 - 5.1 mmol/L   Chloride 109  101 - 111 mmol/L   CO2 18 (L) 22 - 32 mmol/L   Glucose, Bld 102 (H) 65 - 99 mg/dL   BUN 51 (H) 6 - 20 mg/dL   Creatinine, Ser 5.78 (H) 0.61 - 1.24 mg/dL   Calcium 8.9 8.9 - 10.3 mg/dL   Total Protein 6.9 6.5 - 8.1 g/dL   Albumin 3.3 (L) 3.5 - 5.0 g/dL   AST 20 15 - 41 U/L   ALT 16 (L) 17 - 63 U/L   Alkaline Phosphatase 77 38 - 126 U/L   Total Bilirubin 0.5 0.3 - 1.2 mg/dL   GFR calc non Af Amer 10 (L) >60 mL/min   GFR calc Af Amer 12 (L) >60 mL/min   Anion gap 11 5 - 15    Labs Reviewed - No data to display  No results found.     ASSESSMENT & PLAN:  No diagnosis found.  No orders of the defined types were placed in this encounter.   Reviewed expectations re: course of current medical issues. Questions answered. Outlined signs and symptoms indicating need for more acute intervention. Patient verbalized understanding. After Visit Summary given.    Procedures:      Robyn Haber, MD 10/05/17 1758

## 2017-10-06 ENCOUNTER — Other Ambulatory Visit: Payer: Self-pay | Admitting: Nephrology

## 2017-10-06 DIAGNOSIS — Z7682 Awaiting organ transplant status: Secondary | ICD-10-CM

## 2017-10-07 DIAGNOSIS — A084 Viral intestinal infection, unspecified: Secondary | ICD-10-CM | POA: Diagnosis not present

## 2017-10-07 DIAGNOSIS — I120 Hypertensive chronic kidney disease with stage 5 chronic kidney disease or end stage renal disease: Secondary | ICD-10-CM | POA: Diagnosis not present

## 2017-10-08 DIAGNOSIS — N186 End stage renal disease: Secondary | ICD-10-CM | POA: Diagnosis not present

## 2017-10-08 DIAGNOSIS — Z713 Dietary counseling and surveillance: Secondary | ICD-10-CM | POA: Diagnosis not present

## 2017-10-08 DIAGNOSIS — Z87891 Personal history of nicotine dependence: Secondary | ICD-10-CM | POA: Diagnosis not present

## 2017-10-08 DIAGNOSIS — R001 Bradycardia, unspecified: Secondary | ICD-10-CM | POA: Diagnosis not present

## 2017-10-08 DIAGNOSIS — M109 Gout, unspecified: Secondary | ICD-10-CM | POA: Insufficient documentation

## 2017-10-08 DIAGNOSIS — Z01818 Encounter for other preprocedural examination: Secondary | ICD-10-CM | POA: Diagnosis not present

## 2017-10-08 DIAGNOSIS — Z125 Encounter for screening for malignant neoplasm of prostate: Secondary | ICD-10-CM | POA: Diagnosis not present

## 2017-10-08 DIAGNOSIS — H9192 Unspecified hearing loss, left ear: Secondary | ICD-10-CM | POA: Insufficient documentation

## 2017-10-08 DIAGNOSIS — Z7682 Awaiting organ transplant status: Secondary | ICD-10-CM | POA: Diagnosis not present

## 2017-10-08 DIAGNOSIS — N2581 Secondary hyperparathyroidism of renal origin: Secondary | ICD-10-CM | POA: Insufficient documentation

## 2017-10-22 ENCOUNTER — Encounter: Payer: Self-pay | Admitting: Vascular Surgery

## 2017-10-22 ENCOUNTER — Ambulatory Visit (HOSPITAL_COMMUNITY)
Admission: RE | Admit: 2017-10-22 | Discharge: 2017-10-22 | Disposition: A | Discharge status: Home / Self Care | Payer: Medicare Other | Source: Ambulatory Visit | Attending: Vascular Surgery | Admitting: Vascular Surgery

## 2017-10-22 ENCOUNTER — Telehealth (HOSPITAL_COMMUNITY): Payer: Self-pay | Admitting: *Deleted

## 2017-10-22 ENCOUNTER — Ambulatory Visit (INDEPENDENT_AMBULATORY_CARE_PROVIDER_SITE_OTHER): Payer: Medicare Other | Admitting: Vascular Surgery

## 2017-10-22 ENCOUNTER — Other Ambulatory Visit: Payer: Self-pay

## 2017-10-22 VITALS — BP 124/71 | HR 71 | Temp 98.4°F | Resp 18 | Ht 72.0 in | Wt 245.0 lb

## 2017-10-22 DIAGNOSIS — N184 Chronic kidney disease, stage 4 (severe): Secondary | ICD-10-CM | POA: Diagnosis not present

## 2017-10-22 DIAGNOSIS — Z4931 Encounter for adequacy testing for hemodialysis: Secondary | ICD-10-CM | POA: Insufficient documentation

## 2017-10-22 NOTE — Progress Notes (Signed)
Patient is a 52 year old male who returns for follow-up today.  He had a left brachiocephalic AV fistula placed September 08, 2017.  He denies any numbness or tingling in his hand.  He is not currently on hemodialysis.  His primary nephrologist is Dr. Jimmy Footman.    Physical exam:  Vitals:   10/22/17 1600  BP: 124/71  Pulse: 71  Resp: 18  Temp: 98.4 F (36.9 C)  TempSrc: Oral  SpO2: 100%  Weight: 245 lb (111.1 kg)  Height: 6' (1.829 m)    Extremities: Left upper extremity palpable thrill audible bruit in fistula.  Fistula is slightly deeper in the upper portions of the arms otherwise seems to be developing well.  Data: Patient had a duplex ultrasound today which shows the fistula is 5 mm in diameter 3 mm in depth.  Assessment: Maturing AV fistula left arm.  Patient will follow-up in May 2019 for further follow-up.  Ruta Hinds, MD [facility] [place] Office: [phone] Pager: [phone]

## 2017-10-22 NOTE — Telephone Encounter (Signed)
Patient given detailed instructions per Stress Test Requisition Sheet for test on 10/16/17 at 2:30.Patient Notified to arrive 30 minutes early, and that it is imperative to arrive on time for appointment to keep from having the test rescheduled.  Patient verbalized understanding. [name] Downs

## 2017-10-26 ENCOUNTER — Other Ambulatory Visit: Payer: Self-pay

## 2017-10-26 ENCOUNTER — Ambulatory Visit (HOSPITAL_COMMUNITY): Payer: Medicare Other

## 2017-10-26 ENCOUNTER — Ambulatory Visit (HOSPITAL_BASED_OUTPATIENT_CLINIC_OR_DEPARTMENT_OTHER): Payer: Medicare Other

## 2017-10-26 ENCOUNTER — Ambulatory Visit (HOSPITAL_COMMUNITY): Payer: Medicare Other | Attending: Cardiovascular Disease

## 2017-10-26 DIAGNOSIS — Z7682 Awaiting organ transplant status: Secondary | ICD-10-CM | POA: Diagnosis not present

## 2017-10-26 DIAGNOSIS — N189 Chronic kidney disease, unspecified: Secondary | ICD-10-CM | POA: Diagnosis not present

## 2017-10-26 DIAGNOSIS — I351 Nonrheumatic aortic (valve) insufficiency: Secondary | ICD-10-CM | POA: Insufficient documentation

## 2017-10-27 ENCOUNTER — Other Ambulatory Visit: Payer: Self-pay

## 2017-10-27 DIAGNOSIS — N184 Chronic kidney disease, stage 4 (severe): Secondary | ICD-10-CM

## 2017-12-22 DIAGNOSIS — I120 Hypertensive chronic kidney disease with stage 5 chronic kidney disease or end stage renal disease: Secondary | ICD-10-CM | POA: Diagnosis not present

## 2017-12-22 DIAGNOSIS — N185 Chronic kidney disease, stage 5: Secondary | ICD-10-CM | POA: Diagnosis not present

## 2017-12-22 DIAGNOSIS — N189 Chronic kidney disease, unspecified: Secondary | ICD-10-CM | POA: Diagnosis not present

## 2017-12-22 DIAGNOSIS — D631 Anemia in chronic kidney disease: Secondary | ICD-10-CM | POA: Diagnosis not present

## 2017-12-22 DIAGNOSIS — M109 Gout, unspecified: Secondary | ICD-10-CM | POA: Diagnosis not present

## 2017-12-22 DIAGNOSIS — N2581 Secondary hyperparathyroidism of renal origin: Secondary | ICD-10-CM | POA: Diagnosis not present

## 2017-12-22 DIAGNOSIS — R809 Proteinuria, unspecified: Secondary | ICD-10-CM | POA: Diagnosis not present

## 2017-12-30 DIAGNOSIS — D631 Anemia in chronic kidney disease: Secondary | ICD-10-CM | POA: Diagnosis not present

## 2017-12-30 DIAGNOSIS — N184 Chronic kidney disease, stage 4 (severe): Secondary | ICD-10-CM | POA: Diagnosis not present

## 2017-12-30 DIAGNOSIS — J301 Allergic rhinitis due to pollen: Secondary | ICD-10-CM | POA: Diagnosis not present

## 2017-12-30 DIAGNOSIS — I129 Hypertensive chronic kidney disease with stage 1 through stage 4 chronic kidney disease, or unspecified chronic kidney disease: Secondary | ICD-10-CM | POA: Diagnosis not present

## 2017-12-30 DIAGNOSIS — M130 Polyarthritis, unspecified: Secondary | ICD-10-CM | POA: Diagnosis not present

## 2017-12-30 DIAGNOSIS — I718 Aortic aneurysm of unspecified site, ruptured: Secondary | ICD-10-CM | POA: Diagnosis not present

## 2017-12-30 DIAGNOSIS — R739 Hyperglycemia, unspecified: Secondary | ICD-10-CM | POA: Diagnosis not present

## 2018-01-20 ENCOUNTER — Ambulatory Visit (INDEPENDENT_AMBULATORY_CARE_PROVIDER_SITE_OTHER): Payer: Medicare Other | Admitting: Physician Assistant

## 2018-01-20 ENCOUNTER — Ambulatory Visit (HOSPITAL_COMMUNITY)
Admission: RE | Admit: 2018-01-20 | Discharge: 2018-01-20 | Disposition: A | Discharge status: Home / Self Care | Payer: Medicare Other | Source: Ambulatory Visit | Attending: Family | Admitting: Family

## 2018-01-20 VITALS — BP 119/71 | HR 57 | Temp 97.9°F | Resp 18 | Ht 72.0 in | Wt 243.0 lb

## 2018-01-20 DIAGNOSIS — N184 Chronic kidney disease, stage 4 (severe): Secondary | ICD-10-CM

## 2018-01-20 DIAGNOSIS — I770 Arteriovenous fistula, acquired: Secondary | ICD-10-CM | POA: Diagnosis not present

## 2018-01-20 NOTE — Progress Notes (Signed)
    Postoperative Access Visit   History of Present Illness   Cobi Delph is a 52 y.o. year old male who presents for postoperative follow-up for: left brachiocephalic arteriovenous fistula (Date: 09/08/17).  The patient's wounds are healed.  The patient denies steal symptoms.  The patient is able to complete their activities of daily living.  Patient is not yet on dialysis.  CKD stage IV managed by Dr. Freddrick March.   Physical Examination   Vitals:   01/20/18 1259  BP: 119/71  Pulse: (!) 57  Resp: 18  Temp: 97.9 F (36.6 C)  TempSrc: Oral  SpO2: 98%  Weight: 243 lb (110.2 kg)  Height: 6' (1.829 m)   Body mass index is 32.96 kg/m.  left arm Incision is healed, hand grip is 5/5, sensation in digits is intact, palpable thrill, bruit can be auscultated; palpable L radial pulse; palpable side branch after sweeping segment    Medical Decision Making   Garrit Marrow is a 52 y.o. year old male who presents s/p left brachiocephalic arteriovenous fistula   Patent left brachiocephalic fistula is ready for use in the event hemodialysis is initiated  Follow up as scheduled with Nephrologist for management of CKD  Fistula may benefit from ligation of competing branches if there is difficulty cannulating  Follow up prn  Dagoberto Ligas, PA-C [facility] [place] Office: [phone]

## 2018-02-15 DIAGNOSIS — I120 Hypertensive chronic kidney disease with stage 5 chronic kidney disease or end stage renal disease: Secondary | ICD-10-CM | POA: Diagnosis not present

## 2018-02-15 DIAGNOSIS — R809 Proteinuria, unspecified: Secondary | ICD-10-CM | POA: Diagnosis not present

## 2018-02-15 DIAGNOSIS — N185 Chronic kidney disease, stage 5: Secondary | ICD-10-CM | POA: Diagnosis not present

## 2018-02-15 DIAGNOSIS — D631 Anemia in chronic kidney disease: Secondary | ICD-10-CM | POA: Diagnosis not present

## 2018-02-15 DIAGNOSIS — N2581 Secondary hyperparathyroidism of renal origin: Secondary | ICD-10-CM | POA: Diagnosis not present

## 2018-03-30 DIAGNOSIS — I129 Hypertensive chronic kidney disease with stage 1 through stage 4 chronic kidney disease, or unspecified chronic kidney disease: Secondary | ICD-10-CM | POA: Diagnosis not present

## 2018-03-30 DIAGNOSIS — N2581 Secondary hyperparathyroidism of renal origin: Secondary | ICD-10-CM | POA: Diagnosis not present

## 2018-03-30 DIAGNOSIS — M1000 Idiopathic gout, unspecified site: Secondary | ICD-10-CM | POA: Diagnosis not present

## 2018-05-11 DIAGNOSIS — D631 Anemia in chronic kidney disease: Secondary | ICD-10-CM | POA: Diagnosis not present

## 2018-05-11 DIAGNOSIS — Z23 Encounter for immunization: Secondary | ICD-10-CM | POA: Diagnosis not present

## 2018-05-11 DIAGNOSIS — R809 Proteinuria, unspecified: Secondary | ICD-10-CM | POA: Diagnosis not present

## 2018-05-11 DIAGNOSIS — N189 Chronic kidney disease, unspecified: Secondary | ICD-10-CM | POA: Diagnosis not present

## 2018-05-11 DIAGNOSIS — I120 Hypertensive chronic kidney disease with stage 5 chronic kidney disease or end stage renal disease: Secondary | ICD-10-CM | POA: Diagnosis not present

## 2018-05-11 DIAGNOSIS — N185 Chronic kidney disease, stage 5: Secondary | ICD-10-CM | POA: Diagnosis not present

## 2018-07-19 DIAGNOSIS — N185 Chronic kidney disease, stage 5: Secondary | ICD-10-CM | POA: Diagnosis not present

## 2018-07-19 DIAGNOSIS — N189 Chronic kidney disease, unspecified: Secondary | ICD-10-CM | POA: Diagnosis not present

## 2018-07-19 DIAGNOSIS — M109 Gout, unspecified: Secondary | ICD-10-CM | POA: Diagnosis not present

## 2018-07-29 DIAGNOSIS — I718 Aortic aneurysm of unspecified site, ruptured: Secondary | ICD-10-CM | POA: Diagnosis not present

## 2018-07-29 DIAGNOSIS — G4739 Other sleep apnea: Secondary | ICD-10-CM | POA: Diagnosis not present

## 2018-07-29 DIAGNOSIS — Z0000 Encounter for general adult medical examination without abnormal findings: Secondary | ICD-10-CM | POA: Diagnosis not present

## 2018-08-20 DIAGNOSIS — N289 Disorder of kidney and ureter, unspecified: Secondary | ICD-10-CM | POA: Insufficient documentation

## 2018-08-20 DIAGNOSIS — M25571 Pain in right ankle and joints of right foot: Secondary | ICD-10-CM | POA: Diagnosis not present

## 2018-08-20 DIAGNOSIS — M79671 Pain in right foot: Secondary | ICD-10-CM | POA: Diagnosis not present

## 2018-10-23 IMAGING — DX DG ABDOMEN 1V
2 series · 2 of 2 positions shown · non-contrast
Comparison: CT of the abdomen and pelvis on 01/11/2014

CLINICAL DATA: Right upper quadrant pain and vomiting for 1 week.

EXAM:
ABDOMEN - 1 VIEW

[abdomen kub (1 of 2)]
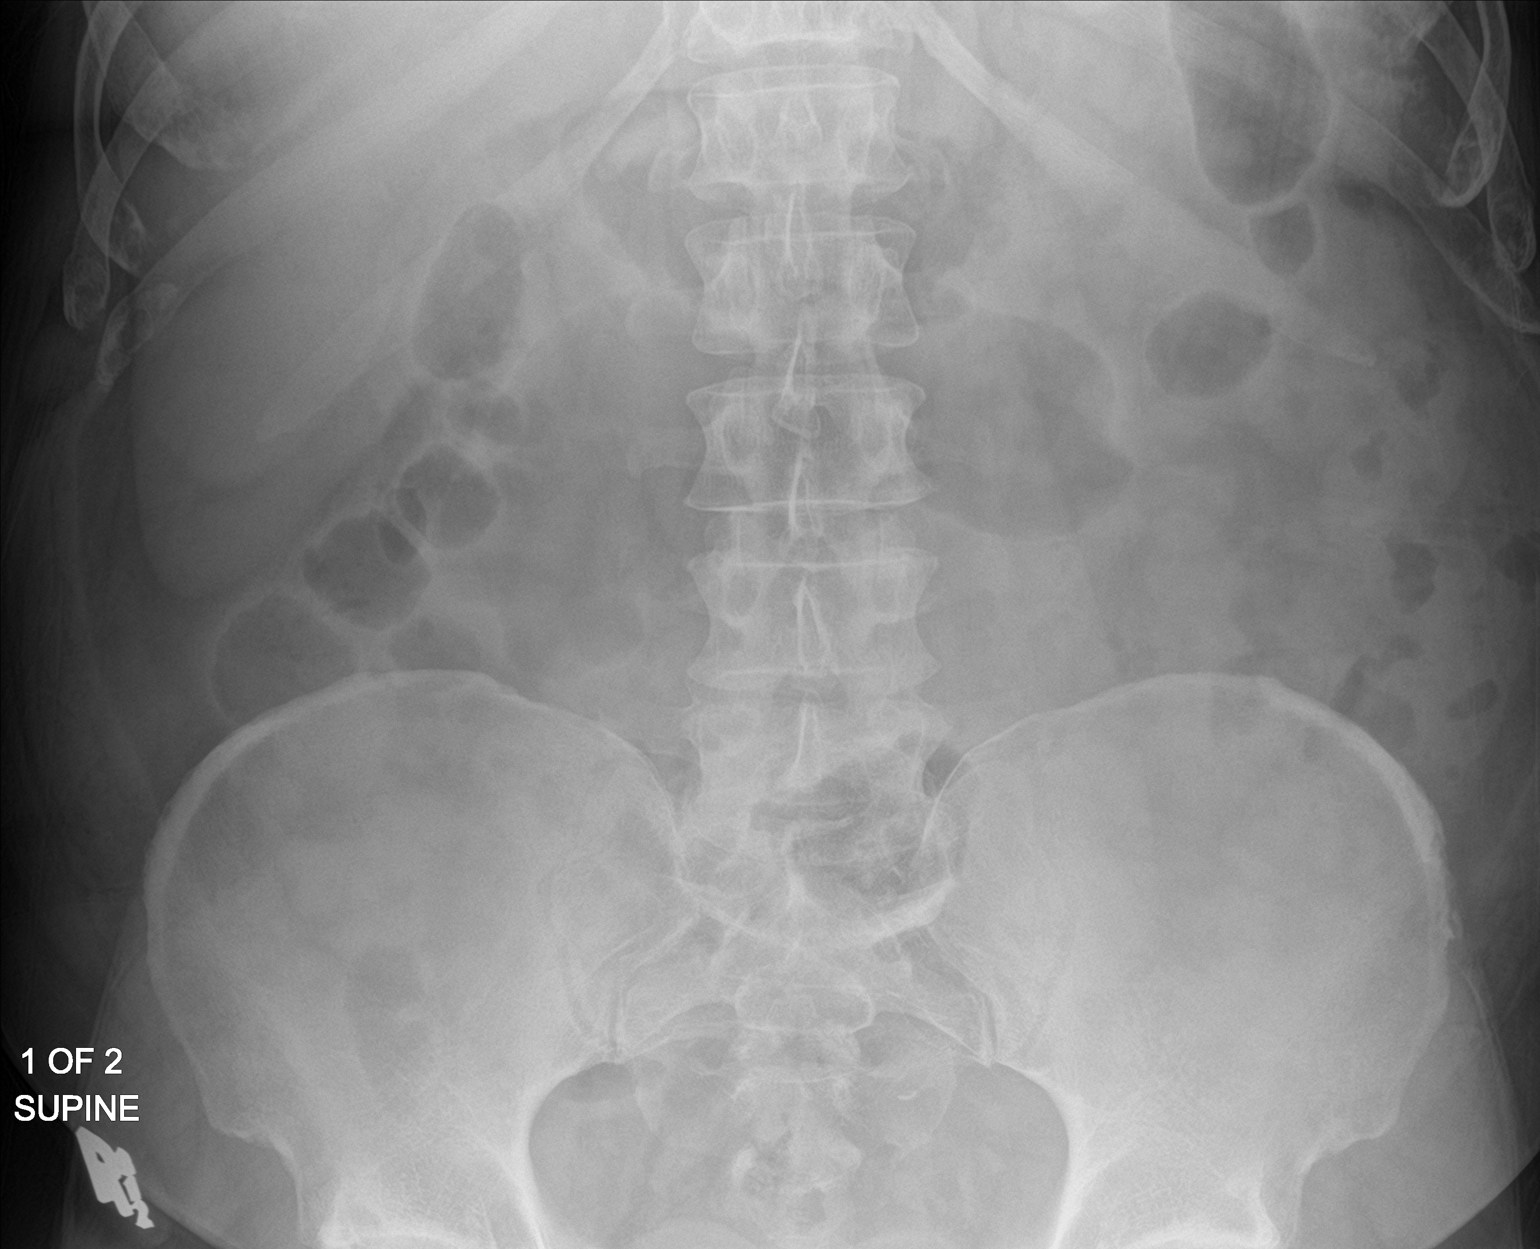

[abdomen kub (2 of 2)]
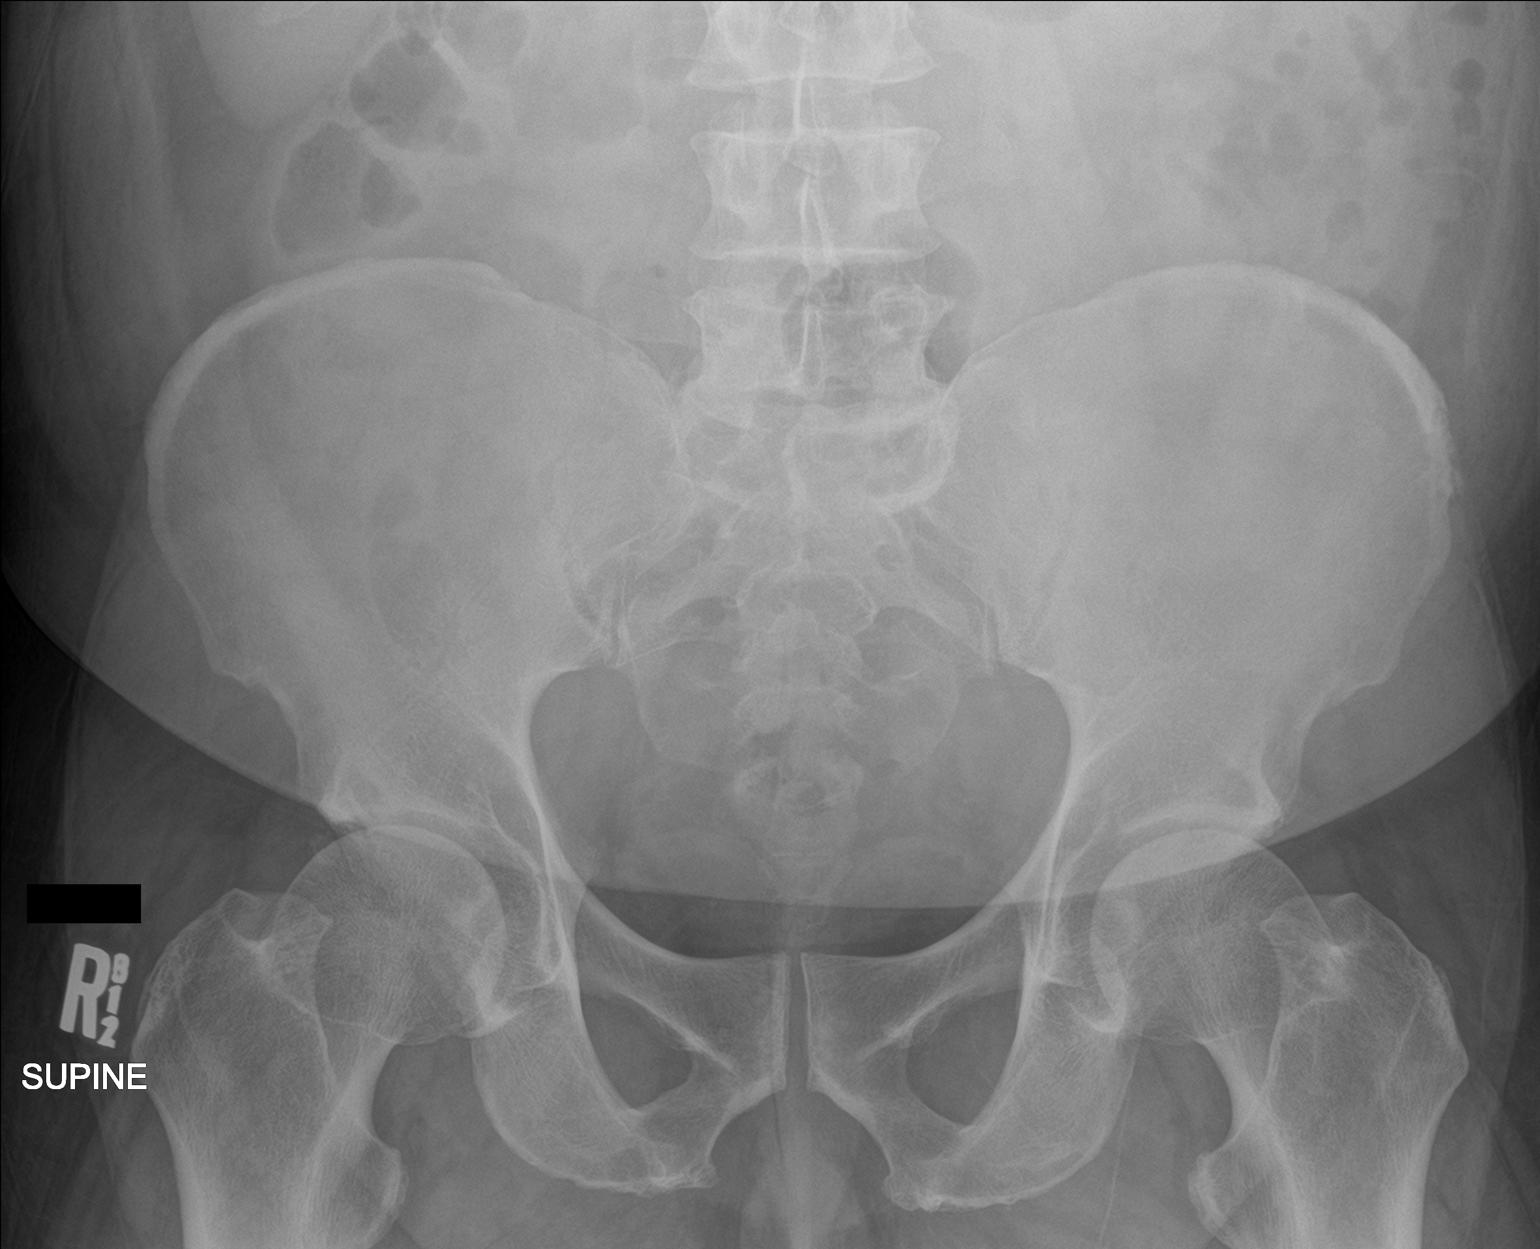

[2 of 2 positions shown; findings below may reference images not displayed]

FINDINGS: Bowel gas pattern is nonobstructive. No abnormal calcifications. No
evidence for organomegaly. No evidence for free intraperitoneal air.
The diaphragms are excluded.
IMPRESSION: No evidence for acute  abnormality.

## 2018-10-28 DIAGNOSIS — N189 Chronic kidney disease, unspecified: Secondary | ICD-10-CM | POA: Diagnosis not present

## 2018-10-28 DIAGNOSIS — N185 Chronic kidney disease, stage 5: Secondary | ICD-10-CM | POA: Diagnosis not present

## 2018-10-28 DIAGNOSIS — N2581 Secondary hyperparathyroidism of renal origin: Secondary | ICD-10-CM | POA: Diagnosis not present

## 2018-10-28 DIAGNOSIS — D631 Anemia in chronic kidney disease: Secondary | ICD-10-CM | POA: Diagnosis not present

## 2018-10-28 DIAGNOSIS — I120 Hypertensive chronic kidney disease with stage 5 chronic kidney disease or end stage renal disease: Secondary | ICD-10-CM | POA: Diagnosis not present

## 2018-10-28 DIAGNOSIS — R809 Proteinuria, unspecified: Secondary | ICD-10-CM | POA: Diagnosis not present

## 2018-11-30 DIAGNOSIS — R809 Proteinuria, unspecified: Secondary | ICD-10-CM | POA: Diagnosis not present

## 2018-11-30 DIAGNOSIS — I129 Hypertensive chronic kidney disease with stage 1 through stage 4 chronic kidney disease, or unspecified chronic kidney disease: Secondary | ICD-10-CM | POA: Diagnosis not present

## 2018-11-30 DIAGNOSIS — E785 Hyperlipidemia, unspecified: Secondary | ICD-10-CM | POA: Diagnosis not present

## 2018-11-30 DIAGNOSIS — G4733 Obstructive sleep apnea (adult) (pediatric): Secondary | ICD-10-CM | POA: Diagnosis not present

## 2018-11-30 DIAGNOSIS — I770 Arteriovenous fistula, acquired: Secondary | ICD-10-CM | POA: Diagnosis not present

## 2018-12-24 DIAGNOSIS — I120 Hypertensive chronic kidney disease with stage 5 chronic kidney disease or end stage renal disease: Secondary | ICD-10-CM | POA: Diagnosis not present

## 2018-12-24 DIAGNOSIS — R809 Proteinuria, unspecified: Secondary | ICD-10-CM | POA: Diagnosis not present

## 2018-12-24 DIAGNOSIS — D631 Anemia in chronic kidney disease: Secondary | ICD-10-CM | POA: Diagnosis not present

## 2018-12-24 DIAGNOSIS — N185 Chronic kidney disease, stage 5: Secondary | ICD-10-CM | POA: Diagnosis not present

## 2018-12-24 DIAGNOSIS — N2581 Secondary hyperparathyroidism of renal origin: Secondary | ICD-10-CM | POA: Diagnosis not present

## 2019-01-06 ENCOUNTER — Other Ambulatory Visit (HOSPITAL_BASED_OUTPATIENT_CLINIC_OR_DEPARTMENT_OTHER): Payer: Self-pay

## 2019-01-06 DIAGNOSIS — R0683 Snoring: Secondary | ICD-10-CM

## 2019-01-06 DIAGNOSIS — G4730 Sleep apnea, unspecified: Secondary | ICD-10-CM

## 2019-01-26 ENCOUNTER — Encounter (HOSPITAL_COMMUNITY): Payer: Self-pay

## 2019-01-26 ENCOUNTER — Other Ambulatory Visit: Payer: Self-pay

## 2019-01-26 ENCOUNTER — Emergency Department (HOSPITAL_COMMUNITY)
Admission: EM | Admit: 2019-01-26 | Discharge: 2019-01-26 | Disposition: A | Discharge status: Home / Self Care | Payer: Medicare Other | Attending: Emergency Medicine | Admitting: Emergency Medicine

## 2019-01-26 DIAGNOSIS — I129 Hypertensive chronic kidney disease with stage 1 through stage 4 chronic kidney disease, or unspecified chronic kidney disease: Secondary | ICD-10-CM | POA: Insufficient documentation

## 2019-01-26 DIAGNOSIS — F17210 Nicotine dependence, cigarettes, uncomplicated: Secondary | ICD-10-CM | POA: Insufficient documentation

## 2019-01-26 DIAGNOSIS — N184 Chronic kidney disease, stage 4 (severe): Secondary | ICD-10-CM | POA: Insufficient documentation

## 2019-01-26 DIAGNOSIS — M545 Low back pain: Secondary | ICD-10-CM | POA: Diagnosis not present

## 2019-01-26 DIAGNOSIS — Z79899 Other long term (current) drug therapy: Secondary | ICD-10-CM | POA: Insufficient documentation

## 2019-01-26 MED ORDER — METHOCARBAMOL 500 MG PO TABS: 500.0000 mg | ORAL_TABLET | Freq: Three times a day (TID) | ORAL | 0 refills | Status: DC | PRN

## 2019-01-26 MED ORDER — LIDOCAINE 5 % EX PTCH: 1.0000 | MEDICATED_PATCH | CUTANEOUS | 0 refills | Status: DC
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
## 2019-01-26 NOTE — ED Notes (Signed)
Patient verbalizes understanding of discharge instructions. Opportunity for questioning and answers were provided. Armband removed by staff, pt discharged from ED.  
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
## 2019-01-31 DIAGNOSIS — I129 Hypertensive chronic kidney disease with stage 1 through stage 4 chronic kidney disease, or unspecified chronic kidney disease: Secondary | ICD-10-CM | POA: Diagnosis not present

## 2019-01-31 DIAGNOSIS — M545 Low back pain: Secondary | ICD-10-CM | POA: Diagnosis not present

## 2019-02-03 ENCOUNTER — Other Ambulatory Visit (HOSPITAL_COMMUNITY)
Admission: RE | Admit: 2019-02-03 | Discharge: 2019-02-03 | Disposition: A | Discharge status: Home / Self Care | Payer: Medicare Other | Source: Ambulatory Visit | Attending: Internal Medicine | Admitting: Internal Medicine

## 2019-02-03 DIAGNOSIS — Z1159 Encounter for screening for other viral diseases: Secondary | ICD-10-CM | POA: Insufficient documentation

## 2019-02-03 LAB — SARS CORONAVIRUS 2 (TAT 6-24 HRS): SARS Coronavirus 2: NEGATIVE

## 2019-02-06 ENCOUNTER — Other Ambulatory Visit: Payer: Self-pay

## 2019-02-06 ENCOUNTER — Ambulatory Visit (HOSPITAL_BASED_OUTPATIENT_CLINIC_OR_DEPARTMENT_OTHER): Payer: Medicare Other | Attending: Family Medicine | Admitting: Internal Medicine

## 2019-02-06 DIAGNOSIS — R0683 Snoring: Secondary | ICD-10-CM | POA: Diagnosis not present

## 2019-02-06 DIAGNOSIS — G4730 Sleep apnea, unspecified: Secondary | ICD-10-CM | POA: Diagnosis not present

## 2019-02-12 DIAGNOSIS — R0683 Snoring: Secondary | ICD-10-CM

## 2019-02-12 NOTE — Procedures (Signed)
     Patient Name: [name] Downs, [name] Downs Date: 02/06/2019 Gender: Male D.O.B: March 17, 1966 Age (years): 53 Referring Provider: [name] Height (inches): 72 Interpreting Physician: Baird Lyons MD, ABSM Weight (lbs): 245 RPSGT: Zadie Rhine BMI: 33 MRN: [id number] Neck Size: 17.00  CLINICAL INFORMATION Sleep Study Type: NPSG Indication for sleep study: Snoring Epworth Sleepiness Score: 8  SLEEP STUDY TECHNIQUE As per the AASM Manual for the Scoring of Sleep and Associated Events v2.3 (April 2016) with a hypopnea requiring 4% desaturations.  The channels recorded and monitored were frontal, central and occipital EEG, electrooculogram (EOG), submentalis EMG (chin), nasal and oral airflow, thoracic and abdominal wall motion, anterior tibialis EMG, snore microphone, electrocardiogram, and pulse oximetry.  MEDICATIONS Medications self-administered by patient taken the night of the study : AMLODIPINE BESYLATE, HYBRALAZINE  SLEEP ARCHITECTURE The study was initiated at 9:51:14 PM and ended at 4:39:12 AM.  Sleep onset time was 31.7 minutes and the sleep efficiency was 66.9%%. The total sleep time was 273 minutes.  Stage REM latency was 135.5 minutes.  The patient spent 7.9%% of the night in stage N1 sleep, 70.7%% in stage N2 sleep, 0.0%% in stage N3 and 21.4% in REM.  Alpha intrusion was absent.  Supine sleep was 19.68%.  RESPIRATORY PARAMETERS The overall apnea/hypopnea index (AHI) was 2.9 per hour. There were 7 total apneas, including 5 obstructive, 0 central and 2 mixed apneas. There were 6 hypopneas and 33 RERAs.  The AHI during Stage REM sleep was 3.1 per hour.  AHI while supine was 3.4 per hour.  The mean oxygen saturation was 94.7%. The minimum SpO2 during sleep was 90.0%.  loud snoring was noted during this study.  CARDIAC DATA The 2 lead EKG demonstrated sinus rhythm. The mean heart rate was 53.1 beats per minute. Other EKG findings include: None.  LEG MOVEMENT  DATA The total PLMS were 0 with a resulting PLMS index of 0.0. Associated arousal with leg movement index was 0.0 .  IMPRESSIONS - No significant obstructive sleep apnea occurred during this study (AHI = 2.9/h). - No significant central sleep apnea occurred during this study (CAI = 0.0/h). - The patient had minimal or no oxygen desaturation during the study (Min O2 = 90.0%) - The patient snored with loud snoring volume. - No cardiac abnormalities were noted during this study. - Clinically significant periodic limb movements did not occur during sleep. No significant associated arousals.  DIAGNOSIS - Primary snoring  RECOMMENDATIONS - Manage for symptoms and snoring as clinically appropriate. - Be careful with alcohol, sedatives and other CNS depressants that may worsen sleep apnea and disrupt normal sleep architecture. - Sleep hygiene should be reviewed to assess factors that may improve sleep quality. - Weight management and regular exercise should be initiated or continued if appropriate.  [Electronically signed] 02/12/2019 11:28 AM  Baird Lyons MD, ABSM Diplomate, American Board of Sleep Medicine   NPI: [id number]                        [place] [place] [facility]  ELECTRONICALLY SIGNED ON:  02/12/2019, 11:27 AM [place] PH: [phone]   FX: [phone] [place]

## 2019-02-15 DIAGNOSIS — N185 Chronic kidney disease, stage 5: Secondary | ICD-10-CM | POA: Diagnosis not present

## 2019-02-15 DIAGNOSIS — D631 Anemia in chronic kidney disease: Secondary | ICD-10-CM | POA: Diagnosis not present

## 2019-02-15 DIAGNOSIS — R809 Proteinuria, unspecified: Secondary | ICD-10-CM | POA: Diagnosis not present

## 2019-02-15 DIAGNOSIS — I120 Hypertensive chronic kidney disease with stage 5 chronic kidney disease or end stage renal disease: Secondary | ICD-10-CM | POA: Diagnosis not present

## 2019-02-15 DIAGNOSIS — N2581 Secondary hyperparathyroidism of renal origin: Secondary | ICD-10-CM | POA: Diagnosis not present

## 2019-03-03 DIAGNOSIS — I129 Hypertensive chronic kidney disease with stage 1 through stage 4 chronic kidney disease, or unspecified chronic kidney disease: Secondary | ICD-10-CM | POA: Diagnosis not present

## 2019-03-03 DIAGNOSIS — J301 Allergic rhinitis due to pollen: Secondary | ICD-10-CM | POA: Diagnosis not present

## 2019-03-03 DIAGNOSIS — E785 Hyperlipidemia, unspecified: Secondary | ICD-10-CM | POA: Diagnosis not present

## 2019-03-03 DIAGNOSIS — Z0000 Encounter for general adult medical examination without abnormal findings: Secondary | ICD-10-CM | POA: Diagnosis not present

## 2019-03-03 DIAGNOSIS — N184 Chronic kidney disease, stage 4 (severe): Secondary | ICD-10-CM | POA: Diagnosis not present

## 2019-04-14 DIAGNOSIS — N185 Chronic kidney disease, stage 5: Secondary | ICD-10-CM | POA: Diagnosis not present

## 2019-04-19 DIAGNOSIS — I120 Hypertensive chronic kidney disease with stage 5 chronic kidney disease or end stage renal disease: Secondary | ICD-10-CM | POA: Diagnosis not present

## 2019-04-19 DIAGNOSIS — N2581 Secondary hyperparathyroidism of renal origin: Secondary | ICD-10-CM | POA: Diagnosis not present

## 2019-04-19 DIAGNOSIS — R809 Proteinuria, unspecified: Secondary | ICD-10-CM | POA: Diagnosis not present

## 2019-04-19 DIAGNOSIS — D631 Anemia in chronic kidney disease: Secondary | ICD-10-CM | POA: Diagnosis not present

## 2019-04-19 DIAGNOSIS — N185 Chronic kidney disease, stage 5: Secondary | ICD-10-CM | POA: Diagnosis not present

## 2019-06-03 DIAGNOSIS — Z23 Encounter for immunization: Secondary | ICD-10-CM | POA: Diagnosis not present

## 2019-06-03 DIAGNOSIS — J329 Chronic sinusitis, unspecified: Secondary | ICD-10-CM | POA: Diagnosis not present

## 2019-06-15 ENCOUNTER — Other Ambulatory Visit: Payer: Self-pay | Admitting: *Deleted

## 2019-06-15 ENCOUNTER — Telehealth: Payer: Self-pay | Admitting: *Deleted

## 2019-06-15 DIAGNOSIS — N185 Chronic kidney disease, stage 5: Secondary | ICD-10-CM

## 2019-06-15 NOTE — Telephone Encounter (Signed)
Call from Dr. Adin Hector office to please get patient in to be seen due to pain and "no Thrill" at HD access. Patient is not on dialysis at present. Left message for patient to call this office back. Will schedule dialysis duplex and appt with PA ASAP.

## 2019-06-17 ENCOUNTER — Telehealth (HOSPITAL_COMMUNITY): Payer: Self-pay

## 2019-06-17 NOTE — Telephone Encounter (Signed)

## 2019-06-20 ENCOUNTER — Other Ambulatory Visit: Payer: Self-pay

## 2019-06-20 ENCOUNTER — Ambulatory Visit (HOSPITAL_COMMUNITY)
Admission: RE | Admit: 2019-06-20 | Discharge: 2019-06-20 | Disposition: A | Discharge status: Home / Self Care | Payer: Medicare Other | Source: Ambulatory Visit | Attending: Family | Admitting: Family

## 2019-06-20 ENCOUNTER — Encounter: Payer: Self-pay | Admitting: Family

## 2019-06-20 ENCOUNTER — Ambulatory Visit (INDEPENDENT_AMBULATORY_CARE_PROVIDER_SITE_OTHER): Payer: Medicare Other | Admitting: Family

## 2019-06-20 VITALS — BP 135/78 | HR 69 | Temp 97.1°F | Resp 20 | Ht 72.0 in | Wt 250.0 lb

## 2019-06-20 DIAGNOSIS — N185 Chronic kidney disease, stage 5: Secondary | ICD-10-CM | POA: Insufficient documentation

## 2019-06-20 DIAGNOSIS — N189 Chronic kidney disease, unspecified: Secondary | ICD-10-CM

## 2019-06-20 DIAGNOSIS — T82898A Other specified complication of vascular prosthetic devices, implants and grafts, initial encounter: Secondary | ICD-10-CM

## 2019-06-20 NOTE — Progress Notes (Signed)
CC: improving pain in left upper arm x 2-3 weeks, pulseless left upper arm AVF  History of Present Illness  [name] Downs is a 53 y.o. (05-30-1966) male who is s/p Left Brachial Cephalic AV fistula creation on 09-08-17 by Dr. Oneida Alar. He had 2 previous accesses in his right arm; he states both of these accesses "clogged up".     He returns today after a call from Dr. Adin Hector office on 06-15-19 requesting pt to be seen due to pain and "no thrill" at HD access.   Pt states that 2-3 weeks ago left upper arm had sharp pain and no longer had a pulse in the AVF. He states the pain in his left upper arm is diminishing.  Pt states he is on the transplant list, has his first meeting tomorrow at [facility].   He is left hand dominant. He has not yet started hemodialysis, states his nephrologist has not indicated that he needs to start hemodialysis any time soon.    Past Medical History:  Diagnosis Date  . Aortic dissection (HCC)    type B  . Brain aneurysm   . Chronic kidney disease   . Deafness in left ear   . Gout   . Hypertension   . Medically noncompliant   . Sleep apnea    wears CPAP    Social History Social History   Tobacco Use  . Smoking status: Current Every Day Smoker    Packs/day: 0.10    Years: 20.00    Pack years: 2.00    Types: Cigarettes  . Smokeless tobacco: Former Systems developer    Types: Chew  . Tobacco comment: 4 cigarettes per day.   Substance Use Topics  . Alcohol use: No    Alcohol/week: 0.0 standard drinks  . Drug use: No    Family History Family History  Problem Relation Age of Onset  . Alzheimer's disease Mother   . Cirrhosis Father   . Heart attack Brother     Surgical History Past Surgical History:  Procedure Laterality Date  . AV FISTULA PLACEMENT Right 12/17/2015   Procedure: CREATION OF RADIOCEPHALIC  ARTERIOVENOUS (AV) FISTULA RIGHT LOWER  ARM;  Surgeon: Elam Dutch, MD;  Location: [facility] OR;  Service: Vascular;  Laterality: Right;  . AV FISTULA  PLACEMENT Right 12/17/2015   Procedure: CREATION OF RIGHT UPPER ARM  BRACHIO CEPHALIC ARTERIOVENOUS (AV) FISTULA;  Surgeon: Elam Dutch, MD;  Location: [place];  Service: Vascular;  Laterality: Right;  . AV FISTULA PLACEMENT Left 09/08/2017   Procedure: ARTERIOVENOUS (AV) FISTULA CREATION LEFT BRACHIOCEPHALIC;  Surgeon: Elam Dutch, MD;  Location: [facility];  Service: Vascular;  Laterality: Left;  . BACK SURGERY      Allergies  Allergen Reactions  . Other     Current Outpatient Medications  Medication Sig Dispense Refill  . allopurinol (ZYLOPRIM) 100 MG tablet Take 4 tablets (400 mg total) by mouth daily.    [name] amLODipine (NORVASC) 10 MG tablet Take 10 mg by mouth at bedtime.     . calcitRIOL (ROCALTROL) 0.25 MCG capsule Take 0.25-0.5 mcg by mouth See admin instructions. Take 0.25 mcg by mouth every other day alternating with 0.5 mcg by mouth every other day    . diclofenac sodium (VOLTAREN) 1 % GEL Apply 1 application topically 5 (five) times daily.    . fluticasone (FLONASE) 50 MCG/ACT nasal spray Place 2 sprays into both nostrils daily.   1  . furosemide (LASIX) 20 MG tablet  Take 20 mg by mouth daily.     . hydrALAZINE (APRESOLINE) 25 MG tablet Take 25 mg by mouth daily.    [name] Hyoscyamine Sulfate SL (LEVSIN/SL) 0.125 MG SUBL Place 1 tablet under the tongue every 8 (eight) hours as needed. 12 each 1  . lidocaine (LIDODERM) 5 % Place 1 patch onto the skin daily. Apply 1 patch to area of pain. Remove & Discard patch within 12 hours from application. 14 patch 0  . lisinopril (PRINIVIL,ZESTRIL) 20 MG tablet Take 20 mg by mouth daily.    [name] loratadine (CLARITIN) 10 MG tablet Take 10 mg by mouth daily.    . methocarbamol (ROBAXIN) 500 MG tablet Take 1 tablet (500 mg total) by mouth every 8 (eight) hours as needed for muscle spasms. 15 tablet 0   No current facility-administered medications for this visit.      REVIEW OF SYSTEMS: see HPI for pertinent positives and negatives    PHYSICAL  EXAMINATION:  Vitals:   06/20/19 0907  BP: 135/78  Pulse: 69  Resp: 20  Temp: (!) 97.1 F (36.2 C)  TempSrc: Temporal  SpO2: 99%  Weight: 250 lb (113.4 kg)  Height: 6' (1.829 m)   Body mass index is 33.91 kg/m.  General: Obese male in NAD   HEENT:  No gross abnormalities Pulmonary: Respirations are non-labored, fair air movement in all fields, no rales, rhonchi, or wheezes Abdomen: Soft and non-tender with normal bowel sounds. Musculoskeletal: There are no major deformities.   Neurologic: No focal weakness or paresthesias are detected, bilateral hand grip strength is 5/5 Skin: There are no ulcer or rashes noted. Psychiatric: The patient has normal affect. Cardiovascular: There is a regular rate and rhythm without significant murmur appreciated.  Radial pulses: are 2+ palpable bilaterally. Ulnar pulses are 1+ palpable bilaterally. Left upper arm AVF with no thrill or bruit, no palpable pulse, thrombosed hard areas palpated over AVF, no tenderness to touch at left upper arm  Non-Invasive Vascular Imaging  Left Upper arm Access Duplex  (Date: 06/20/2019):  Findings: +--------------------+----------+-----------------+---------+ AVF                 PSV (cm/s)Flow Vol (mL/min)Comments  +--------------------+----------+-----------------+---------+ Native artery inflow    76           69        Triphasic +--------------------+----------+-----------------+---------+ AVF Anastomosis         0                      Occluded  +--------------------+----------+-----------------+---------+   +------------+----------+-------------+----------+--------+ OUTFLOW VEINPSV (cm/s)Diameter (cm)Depth (cm)Describe +------------+----------+-------------+----------+--------+ Prox UA                                      occluded +------------+----------+-------------+----------+--------+ Mid UA                                       occluded  +------------+----------+-------------+----------+--------+ Dist UA                                      occluded +------------+----------+-------------+----------+--------+ [facility] Fossa  occluded +------------+----------+-------------+----------+--------+ Summary: Occluded left brachial-cephalic arteriovenous fistula.    Medical Decision Making  [name] Downs is a 53 y.o. male who is s/p Left Brachial Cephalic AV fistula creation on 09-08-17.  Occluded left upper arm AVF. Pain in his left upper arm is diminishing as the inflammation from the thrombosis subsides: warm moist heat to left upper arm for pain. He has not yet started hemodialysis.  He is having his first meeting tomorrow for kidney transplant. He has had 2 previous accesses in his right arm that have occluded.   I discussed the above with Dr. Trula Slade. Will await the outcome of his transplant first meeting tomorrow. If Dr. Joelyn Oms decides that pt will need a permanent HD access, will need to schedule pt for vein mapping, then see a [facility] surgeon. He has had accesses in both arms that have occluded.  If he needs immediate hemodialysis, can insert a temporary dialysis cathter.   Follow up as needed, per Dr. Joelyn Oms.    [name] Chambers, RN, MSN, FNP-C [facility] Office: [phone]  06/20/2019, 9:17 AM  Clinic MD: Trula Slade

## 2019-07-07 DIAGNOSIS — N185 Chronic kidney disease, stage 5: Secondary | ICD-10-CM | POA: Diagnosis not present

## 2019-07-12 DIAGNOSIS — R809 Proteinuria, unspecified: Secondary | ICD-10-CM | POA: Diagnosis not present

## 2019-07-12 DIAGNOSIS — N2581 Secondary hyperparathyroidism of renal origin: Secondary | ICD-10-CM | POA: Diagnosis not present

## 2019-07-12 DIAGNOSIS — I120 Hypertensive chronic kidney disease with stage 5 chronic kidney disease or end stage renal disease: Secondary | ICD-10-CM | POA: Diagnosis not present

## 2019-07-12 DIAGNOSIS — D631 Anemia in chronic kidney disease: Secondary | ICD-10-CM | POA: Diagnosis not present

## 2019-07-12 DIAGNOSIS — N185 Chronic kidney disease, stage 5: Secondary | ICD-10-CM | POA: Diagnosis not present

## 2019-08-04 DIAGNOSIS — I10 Essential (primary) hypertension: Secondary | ICD-10-CM | POA: Diagnosis not present

## 2019-08-04 DIAGNOSIS — G4730 Sleep apnea, unspecified: Secondary | ICD-10-CM | POA: Diagnosis not present

## 2019-08-04 DIAGNOSIS — N184 Chronic kidney disease, stage 4 (severe): Secondary | ICD-10-CM | POA: Diagnosis not present

## 2019-08-04 DIAGNOSIS — E785 Hyperlipidemia, unspecified: Secondary | ICD-10-CM | POA: Diagnosis not present

## 2019-08-08 DIAGNOSIS — I770 Arteriovenous fistula, acquired: Secondary | ICD-10-CM | POA: Diagnosis not present

## 2019-08-08 DIAGNOSIS — Z01812 Encounter for preprocedural laboratory examination: Secondary | ICD-10-CM | POA: Diagnosis not present

## 2019-08-08 DIAGNOSIS — N2889 Other specified disorders of kidney and ureter: Secondary | ICD-10-CM | POA: Diagnosis not present

## 2019-08-08 DIAGNOSIS — Z87891 Personal history of nicotine dependence: Secondary | ICD-10-CM | POA: Diagnosis not present

## 2019-08-08 DIAGNOSIS — I120 Hypertensive chronic kidney disease with stage 5 chronic kidney disease or end stage renal disease: Secondary | ICD-10-CM | POA: Diagnosis not present

## 2019-08-08 DIAGNOSIS — Z79899 Other long term (current) drug therapy: Secondary | ICD-10-CM | POA: Diagnosis not present

## 2019-08-08 DIAGNOSIS — N186 End stage renal disease: Secondary | ICD-10-CM | POA: Diagnosis not present

## 2019-08-08 DIAGNOSIS — N189 Chronic kidney disease, unspecified: Secondary | ICD-10-CM | POA: Diagnosis not present

## 2019-08-08 DIAGNOSIS — I129 Hypertensive chronic kidney disease with stage 1 through stage 4 chronic kidney disease, or unspecified chronic kidney disease: Secondary | ICD-10-CM | POA: Diagnosis not present

## 2019-08-08 DIAGNOSIS — Z01818 Encounter for other preprocedural examination: Secondary | ICD-10-CM | POA: Diagnosis not present

## 2019-08-08 DIAGNOSIS — I7100 Dissection of unspecified site of aorta: Secondary | ICD-10-CM | POA: Diagnosis not present

## 2019-08-08 DIAGNOSIS — N185 Chronic kidney disease, stage 5: Secondary | ICD-10-CM | POA: Diagnosis not present

## 2019-08-25 DIAGNOSIS — N185 Chronic kidney disease, stage 5: Secondary | ICD-10-CM | POA: Diagnosis not present

## 2019-08-30 DIAGNOSIS — N2581 Secondary hyperparathyroidism of renal origin: Secondary | ICD-10-CM | POA: Diagnosis not present

## 2019-08-30 DIAGNOSIS — I120 Hypertensive chronic kidney disease with stage 5 chronic kidney disease or end stage renal disease: Secondary | ICD-10-CM | POA: Diagnosis not present

## 2019-08-30 DIAGNOSIS — R809 Proteinuria, unspecified: Secondary | ICD-10-CM | POA: Diagnosis not present

## 2019-08-30 DIAGNOSIS — N185 Chronic kidney disease, stage 5: Secondary | ICD-10-CM | POA: Diagnosis not present

## 2019-08-30 DIAGNOSIS — D631 Anemia in chronic kidney disease: Secondary | ICD-10-CM | POA: Diagnosis not present

## 2019-09-06 DIAGNOSIS — Z20822 Contact with and (suspected) exposure to covid-19: Secondary | ICD-10-CM | POA: Diagnosis not present

## 2019-09-15 DIAGNOSIS — J984 Other disorders of lung: Secondary | ICD-10-CM | POA: Diagnosis not present

## 2019-10-04 DIAGNOSIS — I7101 Dissection of thoracic aorta: Secondary | ICD-10-CM | POA: Diagnosis not present

## 2019-10-04 DIAGNOSIS — I712 Thoracic aortic aneurysm, without rupture: Secondary | ICD-10-CM | POA: Diagnosis not present

## 2019-10-10 ENCOUNTER — Telehealth (HOSPITAL_COMMUNITY): Payer: Self-pay

## 2019-10-10 NOTE — Telephone Encounter (Signed)

## 2019-10-11 ENCOUNTER — Other Ambulatory Visit: Payer: Self-pay

## 2019-10-11 ENCOUNTER — Ambulatory Visit (INDEPENDENT_AMBULATORY_CARE_PROVIDER_SITE_OTHER)
Admission: RE | Admit: 2019-10-11 | Discharge: 2019-10-11 | Disposition: A | Discharge status: Home / Self Care | Payer: Medicare Other | Source: Ambulatory Visit | Attending: Vascular Surgery | Admitting: Vascular Surgery

## 2019-10-11 ENCOUNTER — Encounter: Payer: Self-pay | Admitting: Vascular Surgery

## 2019-10-11 ENCOUNTER — Ambulatory Visit (HOSPITAL_COMMUNITY)
Admission: RE | Admit: 2019-10-11 | Discharge: 2019-10-11 | Disposition: A | Discharge status: Home / Self Care | Payer: Medicare Other | Source: Ambulatory Visit | Attending: Vascular Surgery | Admitting: Vascular Surgery

## 2019-10-11 ENCOUNTER — Ambulatory Visit (INDEPENDENT_AMBULATORY_CARE_PROVIDER_SITE_OTHER): Payer: Medicare Other | Admitting: Vascular Surgery

## 2019-10-11 VITALS — BP 121/79 | HR 88 | Temp 98.0°F | Resp 20 | Ht 72.0 in | Wt 250.0 lb

## 2019-10-11 DIAGNOSIS — N185 Chronic kidney disease, stage 5: Secondary | ICD-10-CM

## 2019-10-11 NOTE — Progress Notes (Signed)
Vascular and Vein Specialist of [facility]  Patient name: [name] Downs MRN: [id number] DOB: 01/08/1966 Sex: male  REASON FOR VISIT: Discuss access for hemodialysis  HPI: [name] Downs is a 54 y.o. male here for discussion of access for hemodialysis.  He is left-handed.  He had undergone left radiocephalic and brachiocephalic fistula creation in 2017 with Dr. Oneida Alar.  These failed to mature and he subsequently underwent a left brachiocephalic fistula.  The patient more reports that this is stayed patent nearly 2 years.  It was placed in January 2019.  He feels that this thrombosed and November or December of this year.  He has never been on hemodialysis.  His renal function has stable lies but is classified as CKD 5  Past Medical History:  Diagnosis Date  . Aortic dissection (HCC)    type B  . Brain aneurysm   . Chronic kidney disease   . Deafness in left ear   . Gout   . Hypertension   . Medically noncompliant   . Sleep apnea    wears CPAP    Family History  Problem Relation Age of Onset  . Alzheimer's disease Mother   . Cirrhosis Father   . Heart attack Brother     SOCIAL HISTORY: Social History   Tobacco Use  . Smoking status: Current Every Day Smoker    Packs/day: 0.25    Years: 20.00    Pack years: 5.00    Types: Cigarettes  . Smokeless tobacco: Former Systems developer    Types: Chew  . Tobacco comment: 4 cigarettes per day.   Substance Use Topics  . Alcohol use: No    Alcohol/week: 0.0 standard drinks    No Known Allergies  Current Outpatient Medications  Medication Sig Dispense Refill  . allopurinol (ZYLOPRIM) 100 MG tablet Take 4 tablets (400 mg total) by mouth daily.    [name] amLODipine (NORVASC) 10 MG tablet Take 10 mg by mouth at bedtime.     . calcitRIOL (ROCALTROL) 0.25 MCG capsule Take 0.25-0.5 mcg by mouth See admin instructions. Take 0.25 mcg by mouth every other day alternating with 0.5 mcg by mouth every other day    .  diclofenac sodium (VOLTAREN) 1 % GEL Apply 1 application topically 5 (five) times daily.    . fluticasone (FLONASE) 50 MCG/ACT nasal spray Place 2 sprays into both nostrils daily.   1  . furosemide (LASIX) 20 MG tablet Take 20 mg by mouth daily.     . hydrALAZINE (APRESOLINE) 25 MG tablet Take 25 mg by mouth daily.    [name] Hyoscyamine Sulfate SL (LEVSIN/SL) 0.125 MG SUBL Place 1 tablet under the tongue every 8 (eight) hours as needed. 12 each 1  . lidocaine (LIDODERM) 5 % Place 1 patch onto the skin daily. Apply 1 patch to area of pain. Remove & Discard patch within 12 hours from application. 14 patch 0  . lisinopril (PRINIVIL,ZESTRIL) 20 MG tablet Take 20 mg by mouth daily.    [name] loratadine (CLARITIN) 10 MG tablet Take 10 mg by mouth daily.    . methocarbamol (ROBAXIN) 500 MG tablet Take 1 tablet (500 mg total) by mouth every 8 (eight) hours as needed for muscle spasms. 15 tablet 0   No current facility-administered medications for this visit.    REVIEW OF SYSTEMS:  [X]  denotes positive finding, [ ]  denotes negative finding Cardiac  Comments:  Chest pain or chest pressure:    Shortness of breath upon exertion:    Short  of breath when lying flat:    Irregular heart rhythm:        Vascular    Pain in calf, thigh, or hip brought on by ambulation:    Pain in feet at night that wakes you up from your sleep:     Blood clot in your veins:    Leg swelling:           PHYSICAL EXAM: Vitals:   10/11/19 1334  BP: 121/79  Pulse: 88  Resp: 20  Temp: 98 F (36.7 C)  SpO2: 99%  Weight: 250 lb (113.4 kg)  Height: 6' (1.829 m)    GENERAL: The patient is a well-nourished male, in no acute distress. The vital signs are documented above. CARDIOVASCULAR: Well-healed incisions over the right wrist right antecubital space and left antecubital space.  He does have a thrombosed left upper arm fistula. PULMONARY: There is good air exchange  MUSCULOSKELETAL: There are no major deformities or cyanosis.  NEUROLOGIC: No focal weakness or paresthesias are detected. SKIN: There are no ulcers or rashes noted. PSYCHIATRIC: The patient has a normal affect.  DATA:  Noninvasive studies showed moderate size basilic veins bilaterally.  I imaged his veins with SonoSite ultrasound and the left basilic vein is much better caliber than the right basilic vein  MEDICAL ISSUES: I discussed options with the patient.  I feel that his next option would be left basilic vein transposition fistula.  He has maintained stable renal function although it has been very marginal for quite some time.  He has a office visit with Dr. Joelyn Oms on March 10 according to the patient.  Will defer the recommendation for placement until that time.  We are available for left arm basilic vein fistula creation as his next access    Rosetta Posner, MD [facility] Vascular and Vein Specialists of [facility] Tel [phone] Pager [phone]

## 2019-10-31 DIAGNOSIS — I120 Hypertensive chronic kidney disease with stage 5 chronic kidney disease or end stage renal disease: Secondary | ICD-10-CM | POA: Diagnosis not present

## 2019-10-31 DIAGNOSIS — Z87891 Personal history of nicotine dependence: Secondary | ICD-10-CM | POA: Diagnosis not present

## 2019-10-31 DIAGNOSIS — N185 Chronic kidney disease, stage 5: Secondary | ICD-10-CM | POA: Diagnosis not present

## 2019-10-31 DIAGNOSIS — Z713 Dietary counseling and surveillance: Secondary | ICD-10-CM | POA: Diagnosis not present

## 2019-11-01 DIAGNOSIS — N185 Chronic kidney disease, stage 5: Secondary | ICD-10-CM | POA: Diagnosis not present

## 2019-11-09 DIAGNOSIS — R809 Proteinuria, unspecified: Secondary | ICD-10-CM | POA: Diagnosis not present

## 2019-11-09 DIAGNOSIS — D631 Anemia in chronic kidney disease: Secondary | ICD-10-CM | POA: Diagnosis not present

## 2019-11-09 DIAGNOSIS — I120 Hypertensive chronic kidney disease with stage 5 chronic kidney disease or end stage renal disease: Secondary | ICD-10-CM | POA: Diagnosis not present

## 2019-11-09 DIAGNOSIS — N2581 Secondary hyperparathyroidism of renal origin: Secondary | ICD-10-CM | POA: Diagnosis not present

## 2019-11-09 DIAGNOSIS — N185 Chronic kidney disease, stage 5: Secondary | ICD-10-CM | POA: Diagnosis not present

## 2019-11-15 ENCOUNTER — Other Ambulatory Visit: Payer: Self-pay

## 2019-11-16 DIAGNOSIS — I120 Hypertensive chronic kidney disease with stage 5 chronic kidney disease or end stage renal disease: Secondary | ICD-10-CM | POA: Diagnosis not present

## 2019-11-16 DIAGNOSIS — N185 Chronic kidney disease, stage 5: Secondary | ICD-10-CM | POA: Diagnosis not present

## 2019-11-16 DIAGNOSIS — E785 Hyperlipidemia, unspecified: Secondary | ICD-10-CM | POA: Diagnosis not present

## 2019-11-16 DIAGNOSIS — Z720 Tobacco use: Secondary | ICD-10-CM | POA: Diagnosis not present

## 2019-11-21 ENCOUNTER — Other Ambulatory Visit (HOSPITAL_COMMUNITY): Payer: Medicare Other

## 2019-11-22 ENCOUNTER — Encounter (HOSPITAL_COMMUNITY): Payer: Self-pay | Admitting: Vascular Surgery

## 2019-11-22 ENCOUNTER — Other Ambulatory Visit: Payer: Self-pay

## 2019-11-22 ENCOUNTER — Other Ambulatory Visit (HOSPITAL_COMMUNITY)
Admission: RE | Admit: 2019-11-22 | Discharge: 2019-11-22 | Disposition: A | Discharge status: Home / Self Care | Payer: Medicare Other | Source: Ambulatory Visit | Attending: Vascular Surgery | Admitting: Vascular Surgery

## 2019-11-22 DIAGNOSIS — Z01812 Encounter for preprocedural laboratory examination: Secondary | ICD-10-CM | POA: Diagnosis not present

## 2019-11-22 DIAGNOSIS — Z20822 Contact with and (suspected) exposure to covid-19: Secondary | ICD-10-CM | POA: Diagnosis not present

## 2019-11-22 LAB — SARS CORONAVIRUS 2 (TAT 6-24 HRS): SARS Coronavirus 2: NEGATIVE

## 2019-11-22 NOTE — Progress Notes (Signed)
Pt denies SOB, chest pain, and being under the care of a cardiologist. Pt stated that PCP is Dr. [name]. Pt denies having a cardiac cath. Pt denies having an EKG in the last year. Pt made aware to stop taking vitamins, fish oil and herbal medications. Do not take any NSAIDs ie: Ibuprofen, Advil, Naproxen (Aleve), Motrin, BC and Goody Powder. Pt reminded to quarantine. Pt verbalized understanding of all pre-op instructions. PA, Anesthesiology, asked to review pt history.

## 2019-11-22 NOTE — Progress Notes (Addendum)
Anesthesia Chart Review: Same day workup  History of type B aortic dissection which was diagnosed in 2006. This is followed by vascular at [facility]. Per last note 10/04/19, "[name] Downs is a 54 year old male is a history of a thoracic dissection. It was last imaged in 2015. He has had no intervening symptoms. We performed a noncontrast CT scan today. I reviewed the scan which appears to be similar to the 4.8 cm that was described in the previous report. I have discussed this with the patient. We will plan on having him return in 1 year with a CT scan."  Brain aneurysm listed in pt history. 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   CKD 5 not yet on HD, followed by Dr. Joelyn Oms.   Will need DOS labs and eval.   Stress echo 10/10/19 (care everywhere): SUMMARY The patient had no chest pain during stress The patient achieved 86 % of maximum predicted heart rate. Negative stress ECG for inducible ischemia at target heart rate. Low-normal LV function at rest 50-55%. There was normal augmentation of all left ventricular wall segments with dobutamine. Normal left ventricular function and global wall motion with stress. Negative dobutamine echocardiography for inducible ischemia at target heart rate. -  CT Chest 10/03/10 (care everywhere): 1. 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 Further evaluation is limited on noncontrast exam. Outside imaging may be helpful for comparison on follow-up if available. 2. 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 Cannot exclude dissection involvement of the ascending aorta or abdominal aorta without contrast. Attention on follow-up. 3. No consolidative pneumonia, pleural effusions, or  pneumothorax.  TTE 09/13/19 (care everywhere): SUMMARY The left ventricular size is normal. Left ventricular systolic function is normal. LV ejection fraction = 55-60%. The left ventricular wall motion is normal. The right ventricle is normal in size and function. The right atrium is mildly dilated. There is mild to moderate aortic regurgitation. There is mild tricuspid regurgitation. Mildly dilated ascending aorta. There is no pericardial effusion. There is no comparison study available.  Carotid Duplex 09/13/19 (care everywhere): Carotid Duplex: Right: 21-39% diameter reduction of the right bulb. No evidence of hemodynamically significant internal carotid artery stenosis. The right vertebral artery flow is antegrade.  Left: 21-39% diameter reduction of the left bulb. No evidence of hemodynamically significant internal carotid artery stenosis. The left vertebral artery flow is antegrade.   Wynonia Musty [facility] Short Stay Center/Anesthesiology Phone [phone] 11/22/2019 9:58 AM

## 2019-11-22 NOTE — Anesthesia Preprocedure Evaluation (Addendum)
Anesthesia Evaluation  Patient identified by MRN, date of birth, ID band Patient awake    Reviewed: Allergy & Precautions, NPO status , Patient's Chart, lab work & pertinent test results  Airway Mallampati: I  TM Distance: >3 FB Neck ROM: Full    Dental   Pulmonary sleep apnea , Current Smoker and Patient abstained from smoking.,    Pulmonary exam normal        Cardiovascular hypertension, Pt. on medications Normal cardiovascular exam  H/O type B aortic dissection   Neuro/Psych H/O Cerebral Aneurysm    GI/Hepatic   Endo/Other    Renal/GU      Musculoskeletal   Abdominal   Peds  Hematology   Anesthesia Other Findings   Reproductive/Obstetrics                            Anesthesia Physical Anesthesia Plan  ASA: III  Anesthesia Plan: MAC   Post-op Pain Management:    Induction: Intravenous  PONV Risk Score and Plan: 0  Airway Management Planned: Simple Face Mask  Additional Equipment:   Intra-op Plan:   Post-operative Plan:   Informed Consent: I have reviewed the patients History and Physical, chart, labs and discussed the procedure including the risks, benefits and alternatives for the proposed anesthesia with the patient or authorized representative who has indicated his/her understanding and acceptance.       Plan Discussed with: CRNA  Anesthesia Plan Comments: (History of type B aortic dissection which was diagnosed in 2006. This is followed by vascular at [facility]. Per last note 10/04/19, "Jaquavion Mccannon is a 54 year old male is a history of a thoracic dissection. It was last imaged in 2015. He has had no intervening symptoms. We performed a noncontrast CT scan today. I reviewed the scan which appears to be similar to the 4.8 cm that was described in the previous report. I have discussed this with the patient. We will plan on having him return in 1 year with a CT scan."  Brain  aneurysm listed in pt history. 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   CKD 5 not yet on HD, followed by Dr. Joelyn Oms.   Will need DOS labs and eval.   Stress echo 10/10/19 (care everywhere): SUMMARY The patient had no chest pain during stress The patient achieved 86 % of maximum predicted heart rate. Negative stress ECG for inducible ischemia at target heart rate. Low-normal LV function at rest 50-55%. There was normal augmentation of all left ventricular wall segments with dobutamine. Normal left ventricular function and global wall motion with stress. Negative dobutamine echocardiography for inducible ischemia at target heart rate. -  CT Chest 10/03/10 (care everywhere): 1. 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 Further evaluation is limited on noncontrast exam. Outside imaging may be helpful for comparison on follow-up if available. 2. 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 Cannot exclude dissection involvement of the ascending aorta or abdominal aorta without contrast. Attention on follow-up. 3. No consolidative pneumonia, pleural effusions, or pneumothorax.  TTE 09/13/19 (care everywhere): SUMMARY The left ventricular size is normal. Left ventricular systolic function is normal. LV ejection fraction = 55-60%. The left ventricular wall motion is normal. The right ventricle is normal in size and function. The right  atrium is mildly dilated. There is mild to moderate aortic regurgitation. There is mild tricuspid regurgitation. Mildly dilated ascending aorta. There is no pericardial effusion. There is no comparison study available.  Carotid Duplex 09/13/19 (care everywhere): Carotid Duplex: Right:  21-39% diameter reduction of the right bulb. No evidence of hemodynamically significant internal carotid artery stenosis. The right vertebral artery flow is antegrade.  Left: 21-39% diameter reduction of the left bulb. No evidence of hemodynamically significant internal carotid artery stenosis. The left vertebral artery flow is antegrade.)       Anesthesia Quick Evaluation

## 2019-11-23 ENCOUNTER — Ambulatory Visit (HOSPITAL_COMMUNITY): Payer: Medicare Other | Admitting: Physician Assistant

## 2019-11-23 ENCOUNTER — Encounter (HOSPITAL_COMMUNITY)
Admission: RE | Disposition: A | Discharge status: Home / Self Care | Payer: Self-pay | Source: Home / Self Care | Attending: Vascular Surgery

## 2019-11-23 ENCOUNTER — Encounter (HOSPITAL_COMMUNITY): Payer: Self-pay | Admitting: Vascular Surgery

## 2019-11-23 ENCOUNTER — Other Ambulatory Visit: Payer: Self-pay

## 2019-11-23 ENCOUNTER — Ambulatory Visit (HOSPITAL_COMMUNITY)
Admission: RE | Admit: 2019-11-23 | Discharge: 2019-11-23 | Disposition: A | Discharge status: Home / Self Care | Payer: Medicare Other | Attending: Vascular Surgery | Admitting: Vascular Surgery

## 2019-11-23 DIAGNOSIS — M109 Gout, unspecified: Secondary | ICD-10-CM | POA: Diagnosis not present

## 2019-11-23 DIAGNOSIS — Z8249 Family history of ischemic heart disease and other diseases of the circulatory system: Secondary | ICD-10-CM | POA: Diagnosis not present

## 2019-11-23 DIAGNOSIS — Z8679 Personal history of other diseases of the circulatory system: Secondary | ICD-10-CM | POA: Diagnosis not present

## 2019-11-23 DIAGNOSIS — I120 Hypertensive chronic kidney disease with stage 5 chronic kidney disease or end stage renal disease: Secondary | ICD-10-CM | POA: Insufficient documentation

## 2019-11-23 DIAGNOSIS — N186 End stage renal disease: Secondary | ICD-10-CM | POA: Diagnosis not present

## 2019-11-23 DIAGNOSIS — Z79899 Other long term (current) drug therapy: Secondary | ICD-10-CM | POA: Insufficient documentation

## 2019-11-23 DIAGNOSIS — F17210 Nicotine dependence, cigarettes, uncomplicated: Secondary | ICD-10-CM | POA: Diagnosis not present

## 2019-11-23 DIAGNOSIS — G4730 Sleep apnea, unspecified: Secondary | ICD-10-CM | POA: Diagnosis not present

## 2019-11-23 DIAGNOSIS — N185 Chronic kidney disease, stage 5: Secondary | ICD-10-CM

## 2019-11-23 HISTORY — DX: Presence of spectacles and contact lenses: Z97.3

## 2019-11-23 HISTORY — PX: AV FISTULA PLACEMENT: SHX1204

## 2019-11-23 LAB — POCT I-STAT, CHEM 8
BUN: 58 mg/dL — ABNORMAL HIGH (ref 6–20)
Calcium, Ion: 1.18 mmol/L (ref 1.15–1.40)
Chloride: 110 mmol/L (ref 98–111)
Creatinine, Ser: 10.5 mg/dL — ABNORMAL HIGH (ref 0.61–1.24)
Glucose, Bld: 99 mg/dL (ref 70–99)
HCT: 38 % — ABNORMAL LOW (ref 39.0–52.0)
Hemoglobin: 12.9 g/dL — ABNORMAL LOW (ref 13.0–17.0)
Potassium: 4.5 mmol/L (ref 3.5–5.1)
Sodium: 140 mmol/L (ref 135–145)
TCO2: 20 mmol/L — ABNORMAL LOW (ref 22–32)

## 2019-11-23 LAB — GLUCOSE, CAPILLARY: Glucose-Capillary: 160 mg/dL — ABNORMAL HIGH (ref 70–99)

## 2019-11-23 SURGERY — ARTERIOVENOUS (AV) FISTULA CREATION
Anesthesia: General | Site: Arm Upper | Laterality: Left

## 2019-11-23 MED ORDER — MIDAZOLAM HCL 5 MG/5ML IJ SOLN
INTRAMUSCULAR | Status: DC | PRN
  Administered 2019-11-23: 2 mg via INTRAVENOUS

## 2019-11-23 MED ORDER — MEPERIDINE HCL 25 MG/ML IJ SOLN: 6.2500 mg | INTRAMUSCULAR | Status: DC | PRN

## 2019-11-23 MED ORDER — HEPARIN SODIUM (PORCINE) 1000 UNIT/ML IJ SOLN
INTRAMUSCULAR | Status: AC
  Filled 2019-11-23: qty 1

## 2019-11-23 MED ORDER — PROPOFOL 10 MG/ML IV BOLUS
INTRAVENOUS | Status: AC
  Filled 2019-11-23: qty 20

## 2019-11-23 MED ORDER — FENTANYL CITRATE (PF) 250 MCG/5ML IJ SOLN
INTRAMUSCULAR | Status: AC
  Filled 2019-11-23: qty 5

## 2019-11-23 MED ORDER — HYDROCODONE-ACETAMINOPHEN 5-325 MG PO TABS: 1.0000 | ORAL_TABLET | Freq: Four times a day (QID) | ORAL | 0 refills | Status: DC | PRN

## 2019-11-23 MED ORDER — ONDANSETRON HCL 4 MG/2ML IJ SOLN
INTRAMUSCULAR | Status: AC
  Filled 2019-11-23: qty 2

## 2019-11-23 MED ORDER — ONDANSETRON HCL 4 MG/2ML IJ SOLN
4.0000 mg | Freq: Once | INTRAMUSCULAR | Status: AC | PRN
  Administered 2019-11-23: 4 mg via INTRAVENOUS

## 2019-11-23 MED ORDER — SUCCINYLCHOLINE CHLORIDE 200 MG/10ML IV SOSY
PREFILLED_SYRINGE | INTRAVENOUS | Status: AC
  Filled 2019-11-23: qty 10

## 2019-11-23 MED ORDER — SODIUM CHLORIDE 0.9 % IV SOLN
INTRAVENOUS | Status: AC
  Filled 2019-11-23: qty 1.2

## 2019-11-23 MED ORDER — LIDOCAINE-EPINEPHRINE 0.5 %-1:200000 IJ SOLN
INTRAMUSCULAR | Status: AC
  Filled 2019-11-23: qty 1

## 2019-11-23 MED ORDER — SODIUM CHLORIDE 0.9 % IV SOLN: INTRAVENOUS | Status: DC

## 2019-11-23 MED ORDER — DEXAMETHASONE SODIUM PHOSPHATE 10 MG/ML IJ SOLN
INTRAMUSCULAR | Status: AC
  Filled 2019-11-23: qty 1

## 2019-11-23 MED ORDER — PROPOFOL 10 MG/ML IV BOLUS
INTRAVENOUS | Status: DC | PRN
  Administered 2019-11-23: 60 mg via INTRAVENOUS

## 2019-11-23 MED ORDER — 0.9 % SODIUM CHLORIDE (POUR BTL) OPTIME
TOPICAL | Status: DC | PRN
  Administered 2019-11-23: 1000 mL

## 2019-11-23 MED ORDER — CHLORHEXIDINE GLUCONATE 4 % EX LIQD: 60.0000 mL | Freq: Once | CUTANEOUS | Status: DC

## 2019-11-23 MED ORDER — FENTANYL CITRATE (PF) 100 MCG/2ML IJ SOLN
INTRAMUSCULAR | Status: DC | PRN
  Administered 2019-11-23: 100 ug via INTRAVENOUS
  Administered 2019-11-23: 25 ug via INTRAVENOUS
  Administered 2019-11-23 (×2): 50 ug via INTRAVENOUS
  Administered 2019-11-23 (×5): 25 ug via INTRAVENOUS

## 2019-11-23 MED ORDER — PROPOFOL 500 MG/50ML IV EMUL
INTRAVENOUS | Status: DC | PRN
  Administered 2019-11-23: 50 ug/kg/min via INTRAVENOUS

## 2019-11-23 MED ORDER — CEFAZOLIN SODIUM-DEXTROSE 2-4 GM/100ML-% IV SOLN
2.0000 g | INTRAVENOUS | Status: AC
  Administered 2019-11-23: 2 g via INTRAVENOUS

## 2019-11-23 MED ORDER — LIDOCAINE 2% (20 MG/ML) 5 ML SYRINGE
INTRAMUSCULAR | Status: AC
  Filled 2019-11-23: qty 5

## 2019-11-23 MED ORDER — LIDOCAINE-EPINEPHRINE 0.5 %-1:200000 IJ SOLN
INTRAMUSCULAR | Status: DC | PRN
  Administered 2019-11-23: 50 mL

## 2019-11-23 MED ORDER — SODIUM CHLORIDE 0.9 % IV SOLN
INTRAVENOUS | Status: DC | PRN
  Administered 2019-11-23: 500 mL

## 2019-11-23 MED ORDER — DEXAMETHASONE SODIUM PHOSPHATE 10 MG/ML IJ SOLN
INTRAMUSCULAR | Status: DC | PRN
  Administered 2019-11-23: 10 mg via INTRAVENOUS

## 2019-11-23 MED ORDER — HYDROMORPHONE HCL 1 MG/ML IJ SOLN: 0.2500 mg | INTRAMUSCULAR | Status: DC | PRN

## 2019-11-23 MED ORDER — EPHEDRINE 5 MG/ML INJ
INTRAVENOUS | Status: AC
  Filled 2019-11-23: qty 10

## 2019-11-23 MED ORDER — PHENYLEPHRINE HCL-NACL 10-0.9 MG/250ML-% IV SOLN
INTRAVENOUS | Status: DC | PRN
  Administered 2019-11-23: 50 ug/min via INTRAVENOUS

## 2019-11-23 MED ORDER — EPHEDRINE SULFATE-NACL 50-0.9 MG/10ML-% IV SOSY
PREFILLED_SYRINGE | INTRAVENOUS | Status: DC | PRN
  Administered 2019-11-23 (×2): 10 mg via INTRAVENOUS

## 2019-11-23 MED ORDER — GLYCOPYRROLATE PF 0.2 MG/ML IJ SOSY
PREFILLED_SYRINGE | INTRAMUSCULAR | Status: AC
  Filled 2019-11-23: qty 1

## 2019-11-23 MED ORDER — ONDANSETRON HCL 4 MG/2ML IJ SOLN
INTRAMUSCULAR | Status: DC | PRN
  Administered 2019-11-23: 4 mg via INTRAVENOUS

## 2019-11-23 MED ORDER — MIDAZOLAM HCL 2 MG/2ML IJ SOLN
INTRAMUSCULAR | Status: AC
  Filled 2019-11-23: qty 2

## 2019-11-23 SURGICAL SUPPLY — 39 items
ARMBAND PINK RESTRICT EXTREMIT (MISCELLANEOUS) ×4 IMPLANT
CANISTER SUCT 3000ML PPV (MISCELLANEOUS) ×2 IMPLANT
CANNULA VESSEL 3MM 2 BLNT TIP (CANNULA) ×2 IMPLANT
CLIP LIGATING EXTRA MED SLVR (CLIP) ×2 IMPLANT
CLIP LIGATING EXTRA SM BLUE (MISCELLANEOUS) ×2 IMPLANT
COVER PROBE W GEL 5X96 (DRAPES) ×2 IMPLANT
COVER WAND RF STERILE (DRAPES) ×1 IMPLANT
DECANTER SPIKE VIAL GLASS SM (MISCELLANEOUS) ×2 IMPLANT
DERMABOND ADVANCED (GAUZE/BANDAGES/DRESSINGS) ×1
DERMABOND ADVANCED .7 DNX12 (GAUZE/BANDAGES/DRESSINGS) ×1 IMPLANT
ELECT REM PT RETURN 9FT ADLT (ELECTROSURGICAL) ×2
ELECTRODE REM PT RTRN 9FT ADLT (ELECTROSURGICAL) ×1 IMPLANT
GAUZE SPONGE 2X2 8PLY STRL LF (GAUZE/BANDAGES/DRESSINGS) IMPLANT
GLOVE BIO SURGEON STRL SZ 6.5 (GLOVE) ×2 IMPLANT
GLOVE BIO SURGEON STRL SZ7.5 (GLOVE) ×1 IMPLANT
GLOVE BIOGEL PI IND STRL 6.5 (GLOVE) IMPLANT
GLOVE BIOGEL PI INDICATOR 6.5 (GLOVE) ×2
GLOVE SS BIOGEL STRL SZ 7.5 (GLOVE) ×1 IMPLANT
GLOVE SUPERSENSE BIOGEL SZ 7.5 (GLOVE) ×1
GOWN STRL REUS W/ TWL LRG LVL3 (GOWN DISPOSABLE) ×3 IMPLANT
GOWN STRL REUS W/ TWL XL LVL3 (GOWN DISPOSABLE) IMPLANT
GOWN STRL REUS W/TWL LRG LVL3 (GOWN DISPOSABLE) ×8
GOWN STRL REUS W/TWL XL LVL3 (GOWN DISPOSABLE) ×4
KIT BASIN OR (CUSTOM PROCEDURE TRAY) ×2 IMPLANT
KIT TURNOVER KIT B (KITS) ×2 IMPLANT
NS IRRIG 1000ML POUR BTL (IV SOLUTION) ×2 IMPLANT
PACK CV ACCESS (CUSTOM PROCEDURE TRAY) ×2 IMPLANT
PAD ARMBOARD 7.5X6 YLW CONV (MISCELLANEOUS) ×4 IMPLANT
SPONGE GAUZE 2X2 STER 10/PKG (GAUZE/BANDAGES/DRESSINGS) ×1
SUT PROLENE 6 0 CC (SUTURE) ×2 IMPLANT
SUT SILK 2 0 SH (SUTURE) ×1 IMPLANT
SUT SILK 3 0 (SUTURE) ×4
SUT SILK 3-0 18XBRD TIE 12 (SUTURE) IMPLANT
SUT VIC AB 3-0 SH 27 (SUTURE) ×4
SUT VIC AB 3-0 SH 27X BRD (SUTURE) ×1 IMPLANT
TAPE CLOTH SOFT 2X10 (GAUZE/BANDAGES/DRESSINGS) ×1 IMPLANT
TOWEL GREEN STERILE (TOWEL DISPOSABLE) ×2 IMPLANT
UNDERPAD 30X30 (UNDERPADS AND DIAPERS) ×2 IMPLANT
WATER STERILE IRR 1000ML POUR (IV SOLUTION) ×2 IMPLANT

## 2019-11-23 NOTE — Anesthesia Procedure Notes (Signed)
Procedure Name: LMA Insertion Date/Time: 11/23/2019 9:16 AM Performed by: Lance Coon, CRNA Pre-anesthesia Checklist: Patient identified, Emergency Drugs available, Suction available, Patient being monitored and Timeout performed Patient Re-evaluated:Patient Re-evaluated prior to induction Oxygen Delivery Method: Circle system utilized Preoxygenation: Pre-oxygenation with 100% oxygen Induction Type: IV induction LMA: LMA inserted LMA Size: 5.0 Number of attempts: 1 Placement Confirmation: positive ETCO2 and breath sounds checked- equal and bilateral Tube secured with: Tape Dental Injury: Teeth and Oropharynx as per pre-operative assessment

## 2019-11-23 NOTE — Op Note (Signed)
    OPERATIVE REPORT  DATE OF SURGERY: 11/23/2019  PATIENT: [name] Downs, 54 y.o. male MRN: [id number]  DOB: 1966-08-06  PRE-OPERATIVE DIAGNOSIS: Chronic renal insufficiency  POST-OPERATIVE DIAGNOSIS:  Same  PROCEDURE: Left basilic vein transposition fistula  SURGEON:  Curt Jews, M.D.  PHYSICIAN ASSISTANT: Matt Eveland, PA-C  ANESTHESIA: LMA  EBL: per anesthesia record  Total I/O In: 500 [I.V.:500] Out: 100 [Blood:100]  BLOOD ADMINISTERED: none  DRAINS: none  SPECIMEN: none  COUNTS CORRECT:  YES  PATIENT DISPOSITION:  PACU - hemodynamically stable  PROCEDURE DETAILS: Patient was taken operating placed supine position with area the left arm prepped draped sterile fashion.  Basilic vein was identified with SonoSite and marked on the surface of the skin from below the antecubital space to the axilla.  Incision was made at the antecubital space in the medial aspect and patient had 2 different branches and the larger of these was chosen for fistula.  2 other incisions were made in the mid upper arm and at the axilla.  The basilic vein join the brachial vein in the upper third of the arm.  Tributary branches were ligated with 3-0 silk ties and divided.  The vein was circumferentially mobilized from the axilla down to below the antecubital space.  The vein was marked to reduce risk of twisting.  The vein was ligated distally and divided.  The vein was brought out of the tunnel and was gently dilated with heparinized saline and was of excellent caliber.  The brachial artery was exposed through the same antecubital incision.  The patient had a prior cephalic vein fistula.  The cephalic vein was ligated and the old anastomosis was exposed.  The patient had a good caliber brachial artery.  The artery was occluded proximal distal to the old anastomosis and the old vein was excised from the brachial artery.  The vein was cut to the appropriate length and was spatulated and sewn end-to-side to  the artery with a running 6-0 Prolene suture.  Clamps were removed and excellent thrill was noted.  Wounds irrigated with saline.  Hemostasis to electrocautery.  The wounds were closed with 3-0 Vicryl in the subcutaneous and subcuticular tissue.  Sterile dressing was applied and the patient was transferred to the recovery room in stable condition   Rosetta Posner, M.D., [facility] 11/23/2019 11:54 AM

## 2019-11-23 NOTE — Discharge Instructions (Signed)
° °  Vascular and Vein Specialists of [place] ° °Discharge Instructions ° °AV Fistula or Graft Surgery for Dialysis Access ° °Please refer to the following instructions for your post-procedure care. Your surgeon or physician assistant will discuss any changes with you. ° °Activity ° °You may drive the day following your surgery, if you are comfortable and no longer taking prescription pain medication. Resume full activity as the soreness in your incision resolves. ° °Bathing/Showering ° °You may shower after you go home. Keep your incision dry for 48 hours. Do not soak in a bathtub, hot tub, or swim until the incision heals completely. You may not shower if you have a hemodialysis catheter. ° °Incision Care ° °Clean your incision with mild soap and water after 48 hours. Pat the area dry with a clean towel. You do not need a bandage unless otherwise instructed. Do not apply any ointments or creams to your incision. You may have skin glue on your incision. Do not peel it off. It will come off on its own in about one week. Your arm may swell a bit after surgery. To reduce swelling use pillows to elevate your arm so it is above your heart. Your doctor will tell you if you need to lightly wrap your arm with an ACE bandage. ° °Diet ° °Resume your normal diet. There are not special food restrictions following this procedure. In order to heal from your surgery, it is CRITICAL to get adequate nutrition. Your body requires vitamins, minerals, and protein. Vegetables are the best source of vitamins and minerals. Vegetables also provide the perfect balance of protein. Processed food has little nutritional value, so try to avoid this. ° °Medications ° °Resume taking all of your medications. If your incision is causing pain, you may take over-the counter pain relievers such as acetaminophen (Tylenol). If you were prescribed a stronger pain medication, please be aware these medications can cause nausea and constipation. Prevent  nausea by taking the medication with a snack or meal. Avoid constipation by drinking plenty of fluids and eating foods with high amount of fiber, such as fruits, vegetables, and grains. Do not take Tylenol if you are taking prescription pain medications. ° ° ° ° °Follow up °Your surgeon may want to see you in the office following your access surgery. If so, this will be arranged at the time of your surgery. ° °Please call us immediately for any of the following conditions: ° °Increased pain, redness, drainage (pus) from your incision site °Fever of 101 degrees or higher °Severe or worsening pain at your incision site °Hand pain or numbness. ° °Reduce your risk of vascular disease: ° °Stop smoking. If you would like help, call [facility] at 1-800-QUIT-NOW (1-800-784-8669) or [place] at 336-586-4000 ° °Manage your cholesterol °Maintain a desired weight °Control your diabetes °Keep your blood pressure down ° °Dialysis ° °It will take several weeks to several months for your new dialysis access to be ready for use. Your surgeon will determine when it is OK to use it. Your nephrologist will continue to direct your dialysis. You can continue to use your Permcath until your new access is ready for use. ° °If you have any questions, please call the office at [phone]. ° °

## 2019-11-23 NOTE — Anesthesia Postprocedure Evaluation (Signed)
Anesthesia Post Note  Patient: [name] Downs  Procedure(s) Performed: LEFT ARM BASILIC VEIN (AV) TRANSPOSITION (Left Arm Upper)     Patient location during evaluation: PACU Anesthesia Type: General Level of consciousness: awake and alert Pain management: pain level controlled Vital Signs Assessment: post-procedure vital signs reviewed and stable Respiratory status: spontaneous breathing, nonlabored ventilation, respiratory function stable and patient connected to nasal cannula oxygen Cardiovascular status: blood pressure returned to baseline and stable Postop Assessment: no apparent nausea or vomiting Anesthetic complications: no    Last Vitals:  Vitals:   11/23/19 1310 11/23/19 1324  BP: (!) 144/83 132/82  Pulse: 74 73  Resp: (!) 25 20  Temp:  36.9 C  SpO2: 100% 100%    Last Pain:  Vitals:   11/23/19 1324  TempSrc:   PainSc: 0-No pain                 [name]

## 2019-11-23 NOTE — Transfer of Care (Signed)
Immediate Anesthesia Transfer of Care Note  Patient: [name] Downs  Procedure(s) Performed: LEFT ARM BASILIC VEIN (AV) TRANSPOSITION (Left Arm Upper)  Patient Location: PACU  Anesthesia Type:General  Level of Consciousness: drowsy and patient cooperative  Airway & Oxygen Therapy: Patient Spontanous Breathing  Post-op Assessment: Report given to RN and Post -op Vital signs reviewed and stable  Post vital signs: Reviewed and stable  Last Vitals:  Vitals Value Taken Time  BP 162/97 11/23/19 1239  Temp    Pulse 76 11/23/19 1243  Resp 34 11/23/19 1243  SpO2 92 % 11/23/19 1243  Vitals shown include unvalidated device data.  Last Pain:  Vitals:   11/23/19 1240  TempSrc:   PainSc: (P) Asleep      Patients Stated Pain Goal: 2 (82/70/78 [id number])  Complications: No apparent anesthesia complications

## 2019-11-23 NOTE — H&P (Signed)
Office Visit     10/11/2019  [facility] -Judge Stall, Arvilla Meres, MD   Vascular Surgery        CKD (chronic kidney disease) stage 5, GFR less than 15 ml/min (HCC)   Dx        Follow-up; Referred by [name], MD   Reason for Visit        Additional Documentation   Vitals:       BP 121/79 (BP Location: Right Arm, Patient Position: Sitting, Cuff Size: Large)       Pulse 88       Temp 98 F (36.7 C)       Resp 20       Ht 6' (1.829 m)       Wt 113.4 kg       SpO2 99%       BMI 33.91 kg/m       BSA 2.4 m    Flowsheets:        Clinical Intake,       Vital Signs,       NEWS,       MEWS Score,       Anthropometrics,       Method of Visit     Encounter Info:        Billing Info,       History,       Allergies,       Detailed Report          Orthostatic Vitals Recorded in This Encounter       10/11/2019   1334            Patient Position: Sitting  BP Location: Right Arm  Cuff Size: Large       All Notes      Progress Notes by Rosetta Posner, MD at 10/11/2019 2:00 PM   Author: Rosetta Posner, MD Author Type: Physician Filed: 10/11/2019  2:23 PM  Note Status: Signed Cosign: Cosign Not Required Encounter Date: 10/11/2019  Editor: Rosetta Posner, MD (Physician)                                                 untitled image      [facility]     Patient name: [name] Downs    MRN: [id number]        DOB: Feb 15, 1966            Sex: male     REASON FOR VISIT: Discuss access for hemodialysis     HPI:  [name] Downs is a 54 y.o. male here for discussion of access for hemodialysis.  He is left-handed.  He had undergone left radiocephalic and brachiocephalic fistula creation in 2017 with Dr.  Oneida Alar.  These failed to mature and he subsequently underwent a left brachiocephalic fistula.  The patient more reports that this is stayed patent nearly 2 years.  It was placed in January 2019.  He feels that this thrombosed and November or December of this year.  He has never been on hemodialysis.  His renal function has stable  lies but is classified as CKD 5          Past Medical History:    Diagnosis   Date    .   Aortic dissection (HCC)            type B    .   Brain aneurysm        .   Chronic kidney disease        .   Deafness in left ear        .   Gout        .   Hypertension        .   Medically noncompliant        .   Sleep apnea            wears CPAP                Family History    Problem   Relation   Age of Onset    .   Alzheimer's disease   Mother        .   Cirrhosis   Father        .   Heart attack   Brother              SOCIAL HISTORY:   Social History             Tobacco Use    .   Smoking status:   Current Every Day Smoker            Packs/day:   0.25            Years:   20.00            Pack years:   5.00            Types:   Cigarettes    .   Smokeless tobacco:   Former Systems developer            Types:   Chew    .   Tobacco comment: 4 cigarettes per day.     Substance Use Topics    .   Alcohol use:   No            Alcohol/week:   0.0 standard drinks          No Known Allergies            Current Outpatient Medications    Medication   Sig   Dispense   Refill    .   allopurinol (ZYLOPRIM) 100 MG tablet   Take 4 tablets (400 mg total) by mouth daily.            [name]   amLODipine (NORVASC) 10 MG tablet   Take 10 mg by mouth at bedtime.             .   calcitRIOL (ROCALTROL) 0.25 MCG capsule   Take 0.25-0.5 mcg by mouth See admin instructions.  Take 0.25 mcg by mouth every other day alternating with 0.5 mcg by mouth every other day            .   diclofenac sodium (VOLTAREN) 1 % GEL   Apply 1 application topically 5 (five) times daily.            .   fluticasone (FLONASE) 50 MCG/ACT nasal spray   Place 2 sprays into both nostrils daily.        1    .  furosemide (LASIX) 20 MG tablet   Take 20 mg by mouth daily.             .   hydrALAZINE (APRESOLINE) 25 MG tablet   Take 25 mg by mouth daily.            [name]   Hyoscyamine Sulfate SL (LEVSIN/SL) 0.125 MG SUBL   Place 1 tablet under the tongue every 8 (eight) hours as needed.   12 each   1    .   lidocaine (LIDODERM) 5 %   Place 1 patch onto the skin daily. Apply 1 patch to area of pain. Remove & Discard patch within 12 hours from application.   14 patch   0    .   lisinopril (PRINIVIL,ZESTRIL) 20 MG tablet   Take 20 mg by mouth daily.            [name]   loratadine (CLARITIN) 10 MG tablet   Take 10 mg by mouth daily.            .   methocarbamol (ROBAXIN) 500 MG tablet   Take 1 tablet (500 mg total) by mouth every 8 (eight) hours as needed for muscle spasms.   15 tablet   0        No current facility-administered medications for this visit.          REVIEW OF SYSTEMS:   [X]  denotes positive finding, [ ]  denotes negative finding   Cardiac       Comments:    Chest pain or chest pressure:            Shortness of breath upon exertion:            Short of breath when lying flat:            Irregular heart rhythm:                         Vascular            Pain in calf, thigh, or hip brought on by ambulation:            Pain in feet at night that wakes you up from your sleep:             Blood clot in your veins:            Leg swelling:                                PHYSICAL EXAM:      Vitals:         10/11/19 1334    BP:   121/79    Pulse:   88    Resp:   20    Temp:   98 F (36.7 C)    SpO2:   99%    Weight:   250 lb (113.4 kg)    Height:   6' (1.829 m)          GENERAL: The patient is a well-nourished male, in no acute distress. The vital signs are documented above.  CARDIOVASCULAR: Well-healed incisions over the right wrist right antecubital space and left antecubital space.  He does have a thrombosed left upper arm fistula.  PULMONARY: There is good air exchange   MUSCULOSKELETAL: There are no major deformities or cyanosis.  NEUROLOGIC: No focal weakness or paresthesias are detected.  SKIN: There are no ulcers or rashes noted.  PSYCHIATRIC: The patient has a normal affect.     DATA:   Noninvasive studies showed moderate size basilic veins bilaterally.  I imaged his veins with SonoSite ultrasound and the left basilic vein is much better caliber than the right basilic vein     MEDICAL ISSUES:  I discussed options with the patient.  I feel that his next option would be left basilic vein transposition fistula.  He has maintained stable renal function although it has been very marginal for quite some time.  He has a office visit with Dr. Joelyn Oms on March 10 according to the patient.  Will defer the recommendation for placement until that time.  We are available for left arm basilic vein fistula creation as his next access           Rosetta Posner, MD [facility]  [facility] [phone]  Pager [phone]         Addendum:  The patient has been re-examined and re-evaluated.  The patient's history and physical has been reviewed and is unchanged.    [name] Downs is a 54 y.o. male is being admitted with ESRD. All the risks, benefits and other treatment options have been discussed with the patient. The patient has consented to proceed with Procedure(s): LEFT ARM BASILIC VEIN (AV)  FISTULA CREATION as a surgical intervention.  [name] Downs 11/23/2019 8:21 AM [facility]

## 2019-12-16 DIAGNOSIS — N185 Chronic kidney disease, stage 5: Secondary | ICD-10-CM | POA: Diagnosis not present

## 2019-12-16 DIAGNOSIS — E785 Hyperlipidemia, unspecified: Secondary | ICD-10-CM | POA: Diagnosis not present

## 2019-12-16 DIAGNOSIS — I120 Hypertensive chronic kidney disease with stage 5 chronic kidney disease or end stage renal disease: Secondary | ICD-10-CM | POA: Diagnosis not present

## 2019-12-16 DIAGNOSIS — Z720 Tobacco use: Secondary | ICD-10-CM | POA: Diagnosis not present

## 2019-12-19 ENCOUNTER — Ambulatory Visit
Admission: EM | Admit: 2019-12-19 | Discharge: 2019-12-19 | Disposition: A | Discharge status: Home / Self Care | Payer: Medicare Other | Attending: Emergency Medicine | Admitting: Emergency Medicine

## 2019-12-19 DIAGNOSIS — M545 Low back pain, unspecified: Secondary | ICD-10-CM

## 2019-12-19 MED ORDER — CYCLOBENZAPRINE HCL 5 MG PO TABS: 5.0000 mg | ORAL_TABLET | Freq: Two times a day (BID) | ORAL | 0 refills | Status: AC | PRN

## 2019-12-19 NOTE — ED Provider Notes (Signed)
[facility]    CSN: [id number] Arrival date & time: 12/19/19  1842      History   Chief Complaint Chief Complaint  Patient presents with  . Back Pain    HPI [name] Downs is a 54 y.o. male with history of hypertension, CKD, brain aneurysm, type B aortic dissection presenting for right low back pain since last Thursday.  States it got worse after a long car ride to the beach.  Denies lower leg swelling, thoracic back pain, chest pain, palpitations, difficulty breathing.  No fall, trauma.  Has not take anything for this.   Past Medical History:  Diagnosis Date  . Aortic dissection (HCC)    type B  . Brain aneurysm   . Chronic kidney disease   . Deafness in left ear   . Gout   . Hypertension   . Medically noncompliant   . Sleep apnea     does not wears CPAP  . Wears glasses     Patient Active Problem List   Diagnosis Date Noted  . Tobacco abuse 01/17/2014  . CKD (chronic kidney disease) stage 4, GFR 15-29 ml/min (HCC) 12/27/2013  . Aortic dissection ([place])   . Hypertension   . Medically noncompliant     Past Surgical History:  Procedure Laterality Date  . AV FISTULA PLACEMENT Right 12/17/2015   Procedure: CREATION OF RADIOCEPHALIC  ARTERIOVENOUS (AV) FISTULA RIGHT LOWER  ARM;  Surgeon: Elam Dutch, MD;  Location: [facility] OR;  Service: Vascular;  Laterality: Right;  . AV FISTULA PLACEMENT Right 12/17/2015   Procedure: CREATION OF RIGHT UPPER ARM  BRACHIO CEPHALIC ARTERIOVENOUS (AV) FISTULA;  Surgeon: Elam Dutch, MD;  Location: [place];  Service: Vascular;  Laterality: Right;  . AV FISTULA PLACEMENT Left 09/08/2017   Procedure: ARTERIOVENOUS (AV) FISTULA CREATION LEFT BRACHIOCEPHALIC;  Surgeon: Elam Dutch, MD;  Location: [facility] OR;  Service: Vascular;  Laterality: Left;  . AV FISTULA PLACEMENT Left 11/23/2019   Procedure: LEFT ARM BASILIC VEIN (AV) TRANSPOSITION;  Surgeon: Rosetta Posner, MD;  Location: [place];  Service: Vascular;  Laterality: Left;  . BACK  SURGERY         Home Medications    Prior to Admission medications   Medication Sig Start Date End Date Taking? Authorizing Provider  allopurinol (ZYLOPRIM) 100 MG tablet Take 100 mg by mouth in the morning, at noon, in the evening, and at bedtime.  09/08/17   Rhyne, Hulen Shouts, PA-C  amLODipine (NORVASC) 10 MG tablet Take 10 mg by mouth at bedtime.     [provider]  calcitRIOL (ROCALTROL) 0.25 MCG capsule Take 0.25 mcg by mouth daily.     [provider]  cyclobenzaprine (FLEXERIL) 5 MG tablet Take 1 tablet (5 mg total) by mouth 2 (two) times daily as needed for up to 5 days for muscle spasms. 12/19/19 12/24/19  Louro-Potvin, [place], PA-C  diclofenac sodium (VOLTAREN) 1 % GEL Apply 1 application topically 4 (four) times daily as needed (pain).     [provider]  fluticasone (FLONASE) 50 MCG/ACT nasal spray Place 2 sprays into both nostrils daily as needed for allergies.  10/23/15   [provider]  furosemide (LASIX) 20 MG tablet Take 20 mg by mouth daily.     [provider]  hydrALAZINE (APRESOLINE) 25 MG tablet Take 25 mg by mouth in the morning and at bedtime.     [provider]  HYDROcodone-acetaminophen (NORCO) 5-325 MG tablet Take 1 tablet  by mouth every 6 (six) hours as needed for moderate pain. 11/23/19   Dagoberto Ligas, PA-C  lisinopril (PRINIVIL,ZESTRIL) 20 MG tablet Take 20 mg by mouth daily.    [provider]  loratadine (CLARITIN) 10 MG tablet Take 10 mg by mouth daily.    [provider]    Family History Family History  Problem Relation Age of Onset  . Alzheimer's disease Mother   . Cirrhosis Father   . Heart attack Brother     Social History Social History   Tobacco Use  . Smoking status: Current Some Day Smoker    Packs/day: 0.25    Years: 20.00    Pack years: 5.00    Types: Cigarettes  . Smokeless tobacco: Former Systems developer    Types: Chew  Substance Use Topics  . Alcohol use: No     Alcohol/week: 0.0 standard drinks  . Drug use: No     Allergies   Patient has no known allergies.   Review of Systems As per HPI   Physical Exam Triage Vital Signs ED Triage Vitals  Enc Vitals Group     BP      Pulse      Resp      Temp      Temp src      SpO2      Weight      Height      Head Circumference      Peak Flow      Pain Score      Pain Loc      Pain Edu?      Excl. in [place]?    No data found.  Updated Vital Signs BP 136/82 (BP Location: Right Arm)   Pulse 62   Temp 98.7 F (37.1 C) (Oral)   Resp 20   SpO2 98%   Visual Acuity Right Eye Distance:   Left Eye Distance:   Bilateral Distance:    Right Eye Near:   Left Eye Near:    Bilateral Near:     Physical Exam Constitutional:      General: He is not in acute distress. HENT:     Head: Normocephalic and atraumatic.  Eyes:     General: No scleral icterus.    Pupils: Pupils are equal, round, and reactive to light.  Cardiovascular:     Rate and Rhythm: Normal rate.  Pulmonary:     Effort: Pulmonary effort is normal. No respiratory distress.     Breath sounds: No wheezing.  Musculoskeletal:     Comments: Mild right lumbar tenderness without spinous process tenderness, PSIS tenderness.  Slightly decreased ROM of lumbar spine: Chronic/stable per patient.  Positive SLR on right, negative left.  Skin:    Coloration: Skin is not jaundiced or pale.  Neurological:     Mental Status: He is alert and oriented to person, place, and time.     Gait: Gait normal.     Deep Tendon Reflexes: Reflexes normal.      UC Treatments / Results  Labs (all labs ordered are listed, but only abnormal results are displayed) Labs Reviewed - No data to display  EKG   Radiology No results found.  Procedures Procedures (including critical care time)  Medications Ordered in UC Medications - No data to display  Initial Impression / Assessment and Plan / UC Course  I have reviewed the triage vital signs and  the nursing notes.  Pertinent labs & imaging results that were available during  my care of the patient were reviewed by me and considered in my medical decision making (see chart for details).     Patient afebrile, nontoxic, hemodynamically stable.  Has tolerated muscle relaxers well in the past, refill sent.  Provided stretches, supportive care as outlined below.  Return precautions discussed, patient verbalized understanding and is agreeable to plan. Final Clinical Impressions(s) / UC Diagnoses   Final diagnoses:  Acute right-sided low back pain without sciatica     Discharge Instructions     Recommend RICE: rest, ice, compression, elevation as needed for pain.    Heat therapy (hot compress, warm wash rag, hot showers, etc.) can help relax muscles and soothe muscle aches. Cold therapy (ice packs) can be used to help swelling both after injury and after prolonged use of areas of chronic pain/aches.  For pain: recommend 350 mg-1000 mg of Tylenol (acetaminophen) and/or 200 mg - 800 mg of Advil (ibuprofen, Motrin) every 8 hours as needed.  May alternate between the two throughout the day as they are generally safe to take together.  DO NOT exceed more than 3000 mg of Tylenol or 3200 mg of ibuprofen in a 24 hour period as this could damage your stomach, kidneys, liver, or increase your bleeding risk.  May take muscle relaxer as needed for severe pain / spasm.  (This medication may cause you to become tired so it is important you do not drink alcohol or operate heavy machinery while on this medication.  Recommend your first dose to be taken before bedtime to monitor for side effects safely)    ED Prescriptions    Medication Sig Dispense Auth. Provider   cyclobenzaprine (FLEXERIL) 5 MG tablet Take 1 tablet (5 mg total) by mouth 2 (two) times daily as needed for up to 5 days for muscle spasms. 10 tablet Cariker-Potvin, [place], PA-C     I have reviewed the PDMP during this encounter.     [place]ppa-Potvin, [place], [place] 12/20/19 0820

## 2019-12-19 NOTE — ED Notes (Signed)
Bed: EXN17 Expected date:  Expected time:  Means of arrival:  Comments: Door broken

## 2019-12-19 NOTE — ED Triage Notes (Signed)
Pt c/o lower back pain since last Thursday, denies injury. States was at [employer], walked around a lot, worse when laying flat.

## 2019-12-19 NOTE — Discharge Instructions (Signed)
Recommend RICE: rest, ice, compression, elevation as needed for pain.    Heat therapy (hot compress, warm wash rag, hot showers, etc.) can help relax muscles and soothe muscle aches. Cold therapy (ice packs) can be used to help swelling both after injury and after prolonged use of areas of chronic pain/aches.  For pain: recommend 350 mg-1000 mg of Tylenol (acetaminophen) and/or 200 mg - 800 mg of Advil (ibuprofen, Motrin) every 8 hours as needed.  May alternate between the two throughout the day as they are generally safe to take together.  DO NOT exceed more than 3000 mg of Tylenol or 3200 mg of ibuprofen in a 24 hour period as this could damage your stomach, kidneys, liver, or increase your bleeding risk.  May take muscle relaxer as needed for severe pain / spasm.  (This medication may cause you to become tired so it is important you do not drink alcohol or operate heavy machinery while on this medication.  Recommend your first dose to be taken before bedtime to monitor for side effects safely) 

## 2019-12-20 ENCOUNTER — Encounter: Payer: Self-pay | Admitting: Emergency Medicine

## 2019-12-24 ENCOUNTER — Emergency Department (HOSPITAL_COMMUNITY)
Admission: EM | Admit: 2019-12-24 | Discharge: 2019-12-24 | Disposition: A | Discharge status: Home / Self Care | Payer: Medicare Other | Attending: Emergency Medicine | Admitting: Emergency Medicine

## 2019-12-24 ENCOUNTER — Encounter (HOSPITAL_COMMUNITY): Payer: Self-pay

## 2019-12-24 DIAGNOSIS — I129 Hypertensive chronic kidney disease with stage 1 through stage 4 chronic kidney disease, or unspecified chronic kidney disease: Secondary | ICD-10-CM | POA: Diagnosis not present

## 2019-12-24 DIAGNOSIS — F17210 Nicotine dependence, cigarettes, uncomplicated: Secondary | ICD-10-CM | POA: Insufficient documentation

## 2019-12-24 DIAGNOSIS — N184 Chronic kidney disease, stage 4 (severe): Secondary | ICD-10-CM | POA: Diagnosis not present

## 2019-12-24 DIAGNOSIS — M545 Low back pain, unspecified: Secondary | ICD-10-CM

## 2019-12-24 DIAGNOSIS — Z79899 Other long term (current) drug therapy: Secondary | ICD-10-CM | POA: Diagnosis not present

## 2019-12-24 MED ORDER — ACETAMINOPHEN ER 650 MG PO TBCR: 650.0000 mg | EXTENDED_RELEASE_TABLET | Freq: Three times a day (TID) | ORAL | 0 refills | Status: DC | PRN

## 2019-12-24 MED ORDER — METHOCARBAMOL 500 MG PO TABS
500.0000 mg | ORAL_TABLET | Freq: Once | ORAL | Status: AC
  Administered 2019-12-24: 500 mg via ORAL
  Filled 2019-12-24: qty 1

## 2019-12-24 MED ORDER — ACETAMINOPHEN 325 MG PO TABS
325.0000 mg | ORAL_TABLET | Freq: Once | ORAL | Status: AC
  Administered 2019-12-24: 325 mg via ORAL
  Filled 2019-12-24: qty 1

## 2019-12-24 MED ORDER — METHOCARBAMOL 500 MG PO TABS: 500.0000 mg | ORAL_TABLET | Freq: Two times a day (BID) | ORAL | 0 refills | Status: DC

## 2019-12-24 MED ORDER — LIDOCAINE 5 % EX PTCH
1.0000 | MEDICATED_PATCH | CUTANEOUS | Status: DC
  Administered 2019-12-24: 1 via TRANSDERMAL
  Filled 2019-12-24: qty 1

## 2019-12-24 NOTE — ED Triage Notes (Signed)
Onset 12-18-19 after driving home from beach right lower back pain.  Was seen at u/c, was given muscle relaxer medication, took last one yesterday, still having back pain.

## 2019-12-24 NOTE — Discharge Instructions (Signed)
As discussed, your back pain is most likely due to a muscular strain.  I am sending you home with more muscle relaxers and Tylenol.  You may purchase over-the-counter Lidoderm patches and Voltaren gel.  Please follow-up with PCP this week for further evaluation if symptoms do not improve.  Return to the ER for new or worsening symptoms.

## 2019-12-24 NOTE — ED Notes (Signed)
Pt states that he is "feeling better & ready to go," & that "the muscle relaxer worked." Pt also states that he has to go pick up his kids "before too long." PA notified

## 2019-12-24 NOTE — ED Notes (Signed)
Patient verbalizes understanding of discharge instructions. Opportunity for questioning and answers were provided. Armband removed by staff, pt discharged from ED stable & ambulatory  

## 2019-12-24 NOTE — ED Provider Notes (Signed)
[facility] EMERGENCY DEPARTMENT Provider Note   CSN: [id number] Arrival date & time: 12/24/19  1606     History Chief Complaint  Patient presents with  . Back Pain    [name] Downs is a 54 y.o. male with a past medical history significant for type B aortic dissection, brain aneurysm, CKD, and hypertension due to right low back pain x7 days.  Patient states back pain started [place] the beach for [place].  Pain is worse when sitting for long periods of time and laying down.  Has a history of back surgery in 2006 due to a herniated disc.  [name] Downs was seen in urgent care on 5/3 and prescribed a muscle relaxer which [name] Downs notes has not improved his pain.  Denies saddle paresthesias, bowel/bladder incontinence, lower extremity weakness/numbness, IV drug use, trauma, and history of cancer.  Denies urinary and penile symptoms.  Denies abdominal pain.  Denies lower extremity edema, shortness of breath, chest pain.  No history of DVT/PE. Denies overlying rash. Denies fever and chills.   History obtained from patient and past medical records. No interpreter used during encounter.      Past Medical History:  Diagnosis Date  . Aortic dissection (HCC)    type B  . Brain aneurysm   . Chronic kidney disease   . Deafness in left ear   . Gout   . Hypertension   . Medically noncompliant   . Sleep apnea     does not wears CPAP  . Wears glasses     Patient Active Problem List   Diagnosis Date Noted  . Tobacco abuse 01/17/2014  . CKD (chronic kidney disease) stage 4, GFR 15-29 ml/min (HCC) 12/27/2013  . Aortic dissection ([place])   . Hypertension   . Medically noncompliant     Past Surgical History:  Procedure Laterality Date  . AV FISTULA PLACEMENT Right 12/17/2015   Procedure: CREATION OF RADIOCEPHALIC  ARTERIOVENOUS (AV) FISTULA RIGHT LOWER  ARM;  Surgeon: Elam Dutch, MD;  Location: [facility] OR;  Service: Vascular;  Laterality: Right;  . AV FISTULA PLACEMENT Right 12/17/2015   Procedure: CREATION OF RIGHT UPPER ARM  BRACHIO CEPHALIC ARTERIOVENOUS (AV) FISTULA;  Surgeon: Elam Dutch, MD;  Location: [facility];  Service: Vascular;  Laterality: Right;  . AV FISTULA PLACEMENT Left 09/08/2017   Procedure: ARTERIOVENOUS (AV) FISTULA CREATION LEFT BRACHIOCEPHALIC;  Surgeon: Elam Dutch, MD;  Location: [facility] OR;  Service: Vascular;  Laterality: Left;  . AV FISTULA PLACEMENT Left 11/23/2019   Procedure: LEFT ARM BASILIC VEIN (AV) TRANSPOSITION;  Surgeon: Rosetta Posner, MD;  Location: [facility];  Service: Vascular;  Laterality: Left;  . BACK SURGERY         Family History  Problem Relation Age of Onset  . Alzheimer's disease Mother   . Cirrhosis Father   . Heart attack Brother     Social History   Tobacco Use  . Smoking status: Current Some Day Smoker    Packs/day: 0.25    Years: 20.00    Pack years: 5.00    Types: Cigarettes  . Smokeless tobacco: Former Systems developer    Types: Chew  Substance Use Topics  . Alcohol use: No    Alcohol/week: 0.0 standard drinks  . Drug use: No    Home Medications Prior to Admission medications   Medication Sig Start Date End Date Taking? Authorizing Provider  acetaminophen (TYLENOL 8 HOUR) 650 MG CR tablet Take 1 tablet (650 mg total)  by mouth every 8 (eight) hours as needed for pain. 12/24/19   Suzy Bouchard, PA-C  allopurinol (ZYLOPRIM) 100 MG tablet Take 100 mg by mouth in the morning, at noon, in the evening, and at bedtime.  09/08/17   Rhyne, Hulen Shouts, PA-C  amLODipine (NORVASC) 10 MG tablet Take 10 mg by mouth at bedtime.     [provider]  calcitRIOL (ROCALTROL) 0.25 MCG capsule Take 0.25 mcg by mouth daily.     [provider]  cyclobenzaprine (FLEXERIL) 5 MG tablet Take 1 tablet (5 mg total) by mouth 2 (two) times daily as needed for up to 5 days for muscle spasms. 12/19/19 12/24/19  Tatar-Potvin, [place], PA-C  diclofenac sodium (VOLTAREN) 1 % GEL Apply 1 application topically 4 (four) times daily as needed  (pain).     [provider]  fluticasone (FLONASE) 50 MCG/ACT nasal spray Place 2 sprays into both nostrils daily as needed for allergies.  10/23/15   [provider]  furosemide (LASIX) 20 MG tablet Take 20 mg by mouth daily.     [provider]  hydrALAZINE (APRESOLINE) 25 MG tablet Take 25 mg by mouth in the morning and at bedtime.     [provider]  HYDROcodone-acetaminophen (NORCO) 5-325 MG tablet Take 1 tablet by mouth every 6 (six) hours as needed for moderate pain. 11/23/19   Dagoberto Ligas, PA-C  lisinopril (PRINIVIL,ZESTRIL) 20 MG tablet Take 20 mg by mouth daily.    [provider]  loratadine (CLARITIN) 10 MG tablet Take 10 mg by mouth daily.    [provider]  methocarbamol (ROBAXIN) 500 MG tablet Take 1 tablet (500 mg total) by mouth 2 (two) times daily. 12/24/19   Suzy Bouchard, PA-C    Allergies    Patient has no known allergies.  Review of Systems   Review of Systems  Constitutional: Negative for chills and fever.  Respiratory: Negative for shortness of breath.   Cardiovascular: Negative for chest pain.  Gastrointestinal: Negative for abdominal pain.  Genitourinary: Negative for dysuria.  Musculoskeletal: Positive for back pain.  Skin: Negative for rash.  Neurological: Negative for numbness.    Physical Exam Updated Vital Signs BP (!) 144/85 (BP Location: Right Arm)   Pulse 60   Temp 98 F (36.7 C) (Oral)   Resp 14   SpO2 100%   Physical Exam Vitals and nursing note reviewed.  Constitutional:      General: [name] Downs is not in acute distress.    Appearance: [name] Downs is not ill-appearing.  HENT:     Head: Normocephalic.  Eyes:     Pupils: Pupils are equal, round, and reactive to light.  Cardiovascular:     Rate and Rhythm: Normal rate and regular rhythm.     Pulses: Normal pulses.     Heart sounds: Normal heart sounds. No murmur. No friction rub. No gallop.   Pulmonary:     Effort: Pulmonary effort is normal.      Breath sounds: Normal breath sounds.  Abdominal:     General: Abdomen is flat. Bowel sounds are normal. There is no distension.     Palpations: Abdomen is soft.     Tenderness: There is no abdominal tenderness. There is no guarding or rebound.     Comments: Abdomen soft, nondistended, nontender to palpation in all quadrants without guarding or peritoneal signs. No rebound.   Musculoskeletal:     Cervical back: Neck supple.     Comments: No T-spine and  L-spine midline tenderness, no stepoff or deformity, reproducible right lumbar paraspinal tenderness No leg edema bilaterally. Negative homan sign bilaterally. Patient moves all extremities without difficulty. DP/PT pulses 2+ and equal bilaterally Sensation grossly intact bilaterally Strength of knee flexion and extension is 5/5 Plantar and dorsiflexion of ankle 5/5 Achilles and patellar reflexes present and equal Able to ambulate without difficulty   Skin:    General: Skin is warm and dry.     Findings: No rash.  Neurological:     General: No focal deficit present.     Mental Status: [name] Downs is alert.  Psychiatric:        Mood and Affect: Mood normal.        Behavior: Behavior normal.     ED Results / Procedures / Treatments   Labs (all labs ordered are listed, but only abnormal results are displayed) Labs Reviewed - No data to display  EKG None  Radiology No results found.  Procedures Procedures (including critical care time)  Medications Ordered in ED Medications  lidocaine (LIDODERM) 5 % 1 patch (1 patch Transdermal Patch Applied 12/24/19 1900)  acetaminophen (TYLENOL) tablet 325 mg (325 mg Oral Given 12/24/19 1900)  methocarbamol (ROBAXIN) tablet 500 mg (500 mg Oral Given 12/24/19 1900)    ED Course  I have reviewed the triage vital signs and the nursing notes.  Pertinent labs & imaging results that were available during my care of the patient were reviewed by me and considered in my medical decision making (see  chart for details).    MDM Rules/Calculators/A&P                     54 year old male presents to the ED due to right-sided low back pain x7 days.  Patient was evaluated at urgent care on 12/19/2019 for the same complaint and prescribed a muscle relaxer which [name] Downs finished today.  Vitals all within normal limits.  Patient is afebrile, not tachycardic or hypoxic.  Patient in no acute distress and non-ill-appearing. History without red flags (cancer, IVDU, weakness, saddle anesthesia, trauma, weight loss) and physical exam most consistent with muscular strain due to reproducible tenderness in right lumbar paraspinal region. Doubt cauda equina or disc herniation due to lack of saddle anesthesia/bowel or bladder incontinence or urinary retention, normal gait and reassuring physical examination without neurologic deficits. No overlying rash to suggest shingles.   Broad differential for back pain considered includes malignancy, disc herniation, spinal epidural abscess, spinal fracture, cauda equina, pyelonephritis, kidney stone, AAA, AD, pancreatitis, PE and PTX. History is not supportive of kidney stone, AAA, AD, pancreatitis, PE or PTX. Patient has no CVA tenderness or urinary symptoms to suggest pyelonephritis or kidney stone.   Will manage patient conservatively at this time with Tylenol, back exercises/stretches, heat therapy and follow up with PCP if symptoms do not resolve in 3-4 weeks. Patient offered muscle relaxer for comfort at night. Counseled on need to return to ED for fever, worsening or concerning symptoms. Strict ED precautions discussed with patient. Patient states understanding and agrees to plan. Patient discharged home in no acute distress and stable vitals. Final Clinical Impression(s) / ED Diagnoses Final diagnoses:  Acute right-sided low back pain without sciatica    Rx / DC Orders ED Discharge Orders         Ordered    methocarbamol (ROBAXIN) 500 MG tablet  2 times daily      12/24/19 2001    acetaminophen (TYLENOL 8 HOUR) 650 MG CR tablet  Every 8 hours PRN     12/24/19 2001           Karie Kirks 12/24/19 2006    Noemi Chapel, MD 12/26/19 [phone]

## 2019-12-27 DIAGNOSIS — N185 Chronic kidney disease, stage 5: Secondary | ICD-10-CM | POA: Diagnosis not present

## 2019-12-30 ENCOUNTER — Other Ambulatory Visit: Payer: Self-pay | Admitting: *Deleted

## 2019-12-30 DIAGNOSIS — N185 Chronic kidney disease, stage 5: Secondary | ICD-10-CM

## 2020-01-02 ENCOUNTER — Telehealth (HOSPITAL_COMMUNITY): Payer: Self-pay

## 2020-01-02 NOTE — Telephone Encounter (Signed)

## 2020-01-03 ENCOUNTER — Ambulatory Visit (HOSPITAL_COMMUNITY)
Admission: RE | Admit: 2020-01-03 | Discharge: 2020-01-03 | Disposition: A | Discharge status: Home / Self Care | Payer: Medicare Other | Source: Ambulatory Visit | Attending: Vascular Surgery | Admitting: Vascular Surgery

## 2020-01-03 ENCOUNTER — Other Ambulatory Visit: Payer: Self-pay

## 2020-01-03 ENCOUNTER — Ambulatory Visit (INDEPENDENT_AMBULATORY_CARE_PROVIDER_SITE_OTHER): Payer: Self-pay | Admitting: Physician Assistant

## 2020-01-03 VITALS — BP 121/76 | HR 86 | Temp 98.0°F | Resp 20 | Ht 72.0 in | Wt 247.0 lb

## 2020-01-03 DIAGNOSIS — N185 Chronic kidney disease, stage 5: Secondary | ICD-10-CM

## 2020-01-03 NOTE — Progress Notes (Signed)
    Postoperative Access Visit   History of Present Illness   [name] Downs is a 54 y.o. year old male who presents for postoperative follow-up for: left first stage basilic vein transposition 11/23/19 by Dr. Donnetta Hutching. The patient's wounds are well healed.  The patient notes no steal symptoms. He does have left upper arm swelling and throbbing that occurs only at  Night but he states that he often sleeps on left arm or with left arm bent and the throbbing is resolved with extension of the left arm. The swelling is somewhat improved with elevation, which he states he does frequently. Otherwise denies any left upper extremity pain, coldness or numbness.  History of previously failed left radiocephalic and left brachiocephalic AV fistula and 2 previous right arm accesses.  He has not yet started hemodialysis. His next follow up with Dr. Joelyn Oms Dr. [place] is on 01/05/20.  Physical Examination   Vitals:   01/03/20 1028  BP: 121/76  Pulse: 86  Resp: 20  Temp: 98 F (36.7 C)  SpO2: 98%  Weight: 247 lb (112 kg)  Height: 6' (1.829 m)   Body mass index is 33.5 kg/m.  left arm Incision is well healed, 2+ radial pulse, hand grip is 5/5, sensation in digits is intact, palpable thrill, bruit can be auscultated. Left hand warm    Medical Decision Making   [name] Downs is a 54 y.o. year old male who presents s/p left first stage basilic vein transposition 11/23/19 by Dr. Donnetta Hutching   Patent is without signs or symptoms of steal syndrome  The patient's access will be ready for use on 02/22/20  Recommend trying to keep left arm straight and to not sleep on left upper extremity if at all possible  Continue to elevate left upper extremity  Keep follow up with Nephrologist on 01/05/20   The patient may follow up on a prn basis   [name] Caldwell, PA-C [facility] Office: [phone]  Clinic MD: Dr. Carlis Abbott

## 2020-01-05 DIAGNOSIS — N185 Chronic kidney disease, stage 5: Secondary | ICD-10-CM | POA: Diagnosis not present

## 2020-01-05 DIAGNOSIS — N2581 Secondary hyperparathyroidism of renal origin: Secondary | ICD-10-CM | POA: Diagnosis not present

## 2020-01-05 DIAGNOSIS — D631 Anemia in chronic kidney disease: Secondary | ICD-10-CM | POA: Diagnosis not present

## 2020-01-05 DIAGNOSIS — R809 Proteinuria, unspecified: Secondary | ICD-10-CM | POA: Diagnosis not present

## 2020-01-05 DIAGNOSIS — I120 Hypertensive chronic kidney disease with stage 5 chronic kidney disease or end stage renal disease: Secondary | ICD-10-CM | POA: Diagnosis not present

## 2020-01-14 DIAGNOSIS — D631 Anemia in chronic kidney disease: Secondary | ICD-10-CM | POA: Diagnosis not present

## 2020-01-14 DIAGNOSIS — E785 Hyperlipidemia, unspecified: Secondary | ICD-10-CM | POA: Diagnosis not present

## 2020-01-14 DIAGNOSIS — I120 Hypertensive chronic kidney disease with stage 5 chronic kidney disease or end stage renal disease: Secondary | ICD-10-CM | POA: Diagnosis not present

## 2020-01-14 DIAGNOSIS — J329 Chronic sinusitis, unspecified: Secondary | ICD-10-CM | POA: Diagnosis not present

## 2020-02-15 DIAGNOSIS — I120 Hypertensive chronic kidney disease with stage 5 chronic kidney disease or end stage renal disease: Secondary | ICD-10-CM | POA: Diagnosis not present

## 2020-02-15 DIAGNOSIS — E785 Hyperlipidemia, unspecified: Secondary | ICD-10-CM | POA: Diagnosis not present

## 2020-02-15 DIAGNOSIS — Z720 Tobacco use: Secondary | ICD-10-CM | POA: Diagnosis not present

## 2020-02-15 DIAGNOSIS — N185 Chronic kidney disease, stage 5: Secondary | ICD-10-CM | POA: Diagnosis not present

## 2020-02-28 DIAGNOSIS — N185 Chronic kidney disease, stage 5: Secondary | ICD-10-CM | POA: Diagnosis not present

## 2020-03-06 DIAGNOSIS — R809 Proteinuria, unspecified: Secondary | ICD-10-CM | POA: Diagnosis not present

## 2020-03-06 DIAGNOSIS — N2581 Secondary hyperparathyroidism of renal origin: Secondary | ICD-10-CM | POA: Diagnosis not present

## 2020-03-06 DIAGNOSIS — I120 Hypertensive chronic kidney disease with stage 5 chronic kidney disease or end stage renal disease: Secondary | ICD-10-CM | POA: Diagnosis not present

## 2020-03-06 DIAGNOSIS — F17200 Nicotine dependence, unspecified, uncomplicated: Secondary | ICD-10-CM | POA: Diagnosis not present

## 2020-03-06 DIAGNOSIS — I770 Arteriovenous fistula, acquired: Secondary | ICD-10-CM | POA: Diagnosis not present

## 2020-03-06 DIAGNOSIS — D631 Anemia in chronic kidney disease: Secondary | ICD-10-CM | POA: Diagnosis not present

## 2020-03-06 DIAGNOSIS — T82858A Stenosis of vascular prosthetic devices, implants and grafts, initial encounter: Secondary | ICD-10-CM | POA: Diagnosis not present

## 2020-03-06 DIAGNOSIS — N185 Chronic kidney disease, stage 5: Secondary | ICD-10-CM | POA: Diagnosis not present

## 2020-03-16 DIAGNOSIS — Z720 Tobacco use: Secondary | ICD-10-CM | POA: Diagnosis not present

## 2020-03-16 DIAGNOSIS — E785 Hyperlipidemia, unspecified: Secondary | ICD-10-CM | POA: Diagnosis not present

## 2020-03-16 DIAGNOSIS — I120 Hypertensive chronic kidney disease with stage 5 chronic kidney disease or end stage renal disease: Secondary | ICD-10-CM | POA: Diagnosis not present

## 2020-03-16 DIAGNOSIS — N185 Chronic kidney disease, stage 5: Secondary | ICD-10-CM | POA: Diagnosis not present

## 2020-04-04 DIAGNOSIS — N185 Chronic kidney disease, stage 5: Secondary | ICD-10-CM | POA: Diagnosis not present

## 2020-04-11 DIAGNOSIS — D631 Anemia in chronic kidney disease: Secondary | ICD-10-CM | POA: Diagnosis not present

## 2020-04-11 DIAGNOSIS — N185 Chronic kidney disease, stage 5: Secondary | ICD-10-CM | POA: Diagnosis not present

## 2020-04-11 DIAGNOSIS — I770 Arteriovenous fistula, acquired: Secondary | ICD-10-CM | POA: Diagnosis not present

## 2020-04-11 DIAGNOSIS — I120 Hypertensive chronic kidney disease with stage 5 chronic kidney disease or end stage renal disease: Secondary | ICD-10-CM | POA: Diagnosis not present

## 2020-04-11 DIAGNOSIS — F17200 Nicotine dependence, unspecified, uncomplicated: Secondary | ICD-10-CM | POA: Diagnosis not present

## 2020-04-11 DIAGNOSIS — N529 Male erectile dysfunction, unspecified: Secondary | ICD-10-CM | POA: Diagnosis not present

## 2020-04-11 DIAGNOSIS — T82858A Stenosis of vascular prosthetic devices, implants and grafts, initial encounter: Secondary | ICD-10-CM | POA: Diagnosis not present

## 2020-04-11 DIAGNOSIS — N2581 Secondary hyperparathyroidism of renal origin: Secondary | ICD-10-CM | POA: Diagnosis not present

## 2020-04-11 DIAGNOSIS — R809 Proteinuria, unspecified: Secondary | ICD-10-CM | POA: Diagnosis not present

## 2020-04-17 DIAGNOSIS — I120 Hypertensive chronic kidney disease with stage 5 chronic kidney disease or end stage renal disease: Secondary | ICD-10-CM | POA: Diagnosis not present

## 2020-04-17 DIAGNOSIS — N185 Chronic kidney disease, stage 5: Secondary | ICD-10-CM | POA: Diagnosis not present

## 2020-04-17 DIAGNOSIS — E785 Hyperlipidemia, unspecified: Secondary | ICD-10-CM | POA: Diagnosis not present

## 2020-04-17 DIAGNOSIS — Z720 Tobacco use: Secondary | ICD-10-CM | POA: Diagnosis not present

## 2020-04-27 DIAGNOSIS — N185 Chronic kidney disease, stage 5: Secondary | ICD-10-CM | POA: Diagnosis not present

## 2020-04-27 DIAGNOSIS — I120 Hypertensive chronic kidney disease with stage 5 chronic kidney disease or end stage renal disease: Secondary | ICD-10-CM | POA: Diagnosis not present

## 2020-04-27 DIAGNOSIS — F17210 Nicotine dependence, cigarettes, uncomplicated: Secondary | ICD-10-CM | POA: Diagnosis not present

## 2020-05-16 DIAGNOSIS — N185 Chronic kidney disease, stage 5: Secondary | ICD-10-CM | POA: Diagnosis not present

## 2020-05-17 DIAGNOSIS — I120 Hypertensive chronic kidney disease with stage 5 chronic kidney disease or end stage renal disease: Secondary | ICD-10-CM | POA: Diagnosis not present

## 2020-05-17 DIAGNOSIS — E785 Hyperlipidemia, unspecified: Secondary | ICD-10-CM | POA: Diagnosis not present

## 2020-05-17 DIAGNOSIS — N185 Chronic kidney disease, stage 5: Secondary | ICD-10-CM | POA: Diagnosis not present

## 2020-05-21 DIAGNOSIS — N2581 Secondary hyperparathyroidism of renal origin: Secondary | ICD-10-CM | POA: Diagnosis not present

## 2020-05-21 DIAGNOSIS — I770 Arteriovenous fistula, acquired: Secondary | ICD-10-CM | POA: Diagnosis not present

## 2020-05-21 DIAGNOSIS — E875 Hyperkalemia: Secondary | ICD-10-CM | POA: Diagnosis not present

## 2020-05-21 DIAGNOSIS — R809 Proteinuria, unspecified: Secondary | ICD-10-CM | POA: Diagnosis not present

## 2020-05-21 DIAGNOSIS — M109 Gout, unspecified: Secondary | ICD-10-CM | POA: Diagnosis not present

## 2020-05-21 DIAGNOSIS — D631 Anemia in chronic kidney disease: Secondary | ICD-10-CM | POA: Diagnosis not present

## 2020-05-21 DIAGNOSIS — I120 Hypertensive chronic kidney disease with stage 5 chronic kidney disease or end stage renal disease: Secondary | ICD-10-CM | POA: Diagnosis not present

## 2020-05-21 DIAGNOSIS — N185 Chronic kidney disease, stage 5: Secondary | ICD-10-CM | POA: Diagnosis not present

## 2020-05-28 ENCOUNTER — Ambulatory Visit
Admission: EM | Admit: 2020-05-28 | Discharge: 2020-05-28 | Disposition: A | Discharge status: Home / Self Care | Payer: Medicare HMO | Attending: Emergency Medicine | Admitting: Emergency Medicine

## 2020-05-28 ENCOUNTER — Other Ambulatory Visit: Payer: Self-pay

## 2020-05-28 ENCOUNTER — Encounter: Payer: Self-pay | Admitting: *Deleted

## 2020-05-28 DIAGNOSIS — Z20822 Contact with and (suspected) exposure to covid-19: Secondary | ICD-10-CM | POA: Diagnosis not present

## 2020-05-28 DIAGNOSIS — J069 Acute upper respiratory infection, unspecified: Secondary | ICD-10-CM | POA: Diagnosis not present

## 2020-05-28 DIAGNOSIS — R197 Diarrhea, unspecified: Secondary | ICD-10-CM

## 2020-05-28 MED ORDER — DM-GUAIFENESIN ER 30-600 MG PO TB12: 1.0000 | ORAL_TABLET | Freq: Two times a day (BID) | ORAL | 0 refills | Status: DC

## 2020-05-28 MED ORDER — BENZONATATE 200 MG PO CAPS: 200.0000 mg | ORAL_CAPSULE | Freq: Three times a day (TID) | ORAL | 0 refills | Status: AC | PRN

## 2020-05-28 MED ORDER — ACETAMINOPHEN 325 MG PO TABS
650.0000 mg | ORAL_TABLET | Freq: Once | ORAL | Status: AC
  Administered 2020-05-28: 650 mg via ORAL

## 2020-05-28 NOTE — Discharge Instructions (Signed)
Covid test pending, monitor my chart for results Tessalon/benzonatate every 8 hours for cough May use Mucinex DM or over-the-counter Coricidin as needed for congestion Rest and fluids Follow-up if not improving or worsening

## 2020-05-28 NOTE — ED Provider Notes (Signed)
[facility]    CSN: [id number] Arrival date & time: 05/28/20  1126      History   Chief Complaint Chief Complaint  Patient presents with  . Migraine  . Sore Throat  . Cough    HPI [name] Downs is a 54 y.o. male history of CKD stage IV, hypertension, prior aortic dissection, presenting today for evaluation of URI symptoms fever and diarrhea.  Patient reports symptoms all began yesterday.  Has had subjective fevers, cough, sore throat and diarrhea.  Denies any close sick contacts.  Denies significant rhinorrhea.  HPI  Past Medical History:  Diagnosis Date  . Aortic dissection (HCC)    type B  . Brain aneurysm   . Chronic kidney disease   . Deafness in left ear   . Gout   . Hypertension   . Medically noncompliant   . Sleep apnea     does not wears CPAP  . Wears glasses     Patient Active Problem List   Diagnosis Date Noted  . Tobacco abuse 01/17/2014  . CKD (chronic kidney disease) stage 4, GFR 15-29 ml/min (HCC) 12/27/2013  . Aortic dissection ([place])   . Hypertension   . Medically noncompliant     Past Surgical History:  Procedure Laterality Date  . AV FISTULA PLACEMENT Right 12/17/2015   Procedure: CREATION OF RADIOCEPHALIC  ARTERIOVENOUS (AV) FISTULA RIGHT LOWER  ARM;  Surgeon: Elam Dutch, MD;  Location: [facility] OR;  Service: Vascular;  Laterality: Right;  . AV FISTULA PLACEMENT Right 12/17/2015   Procedure: CREATION OF RIGHT UPPER ARM  BRACHIO CEPHALIC ARTERIOVENOUS (AV) FISTULA;  Surgeon: Elam Dutch, MD;  Location: [place];  Service: Vascular;  Laterality: Right;  . AV FISTULA PLACEMENT Left 09/08/2017   Procedure: ARTERIOVENOUS (AV) FISTULA CREATION LEFT BRACHIOCEPHALIC;  Surgeon: Elam Dutch, MD;  Location: [facility] OR;  Service: Vascular;  Laterality: Left;  . AV FISTULA PLACEMENT Left 11/23/2019   Procedure: LEFT ARM BASILIC VEIN (AV) TRANSPOSITION;  Surgeon: Rosetta Posner, MD;  Location: [place];  Service: Vascular;  Laterality: Left;  .  BACK SURGERY         Home Medications    Prior to Admission medications   Medication Sig Start Date End Date Taking? Authorizing Provider  allopurinol (ZYLOPRIM) 100 MG tablet Take 100 mg by mouth in the morning, at noon, in the evening, and at bedtime.  09/08/17  Yes Rhyne, Samantha J, PA-C  amLODipine (NORVASC) 10 MG tablet Take 10 mg by mouth at bedtime.    Yes [provider]  fluticasone (FLONASE) 50 MCG/ACT nasal spray Place 2 sprays into both nostrils daily as needed for allergies.  10/23/15  Yes [provider]  hydrALAZINE (APRESOLINE) 25 MG tablet Take 25 mg by mouth in the morning and at bedtime.    Yes [provider]  lisinopril (PRINIVIL,ZESTRIL) 20 MG tablet Take 20 mg by mouth daily.   Yes [provider]  loratadine (CLARITIN) 10 MG tablet Take 10 mg by mouth daily.   Yes [provider]  methocarbamol (ROBAXIN) 500 MG tablet Take 1 tablet (500 mg total) by mouth 2 (two) times daily. 12/24/19  Yes Aberman, Caroline C, PA-C  benzonatate (TESSALON) 200 MG capsule Take 1 capsule (200 mg total) by mouth 3 (three) times daily as needed for up to 7 days for cough. 05/28/20 06/04/20  Orlandria Kissner C, PA-C  calcitRIOL (ROCALTROL) 0.25 MCG capsule Take 0.25 mcg by mouth daily.  [provider]  dextromethorphan-guaiFENesin (MUCINEX DM) 30-600 MG 12hr tablet Take 1 tablet by mouth 2 (two) times daily. 05/28/20   Debra Colon C, PA-C  diclofenac sodium (VOLTAREN) 1 % GEL Apply 1 application topically 4 (four) times daily as needed (pain).     [provider]  furosemide (LASIX) 20 MG tablet Take 20 mg by mouth daily.     [provider]    Family History Family History  Problem Relation Age of Onset  . Alzheimer's disease Mother   . Cirrhosis Father   . Heart attack Brother     Social History Social History   Tobacco Use  . Smoking status: Current Some Day Smoker    Packs/day: 0.25    Years: 20.00     Pack years: 5.00    Types: Cigarettes  . Smokeless tobacco: Former Systems developer    Types: Secondary school teacher  . Vaping Use: Never used  Substance Use Topics  . Alcohol use: No    Alcohol/week: 0.0 standard drinks  . Drug use: No     Allergies   Patient has no known allergies.   Review of Systems Review of Systems  Constitutional: Positive for fatigue and fever. Negative for activity change, appetite change and chills.  HENT: Positive for congestion, rhinorrhea, sinus pressure and sore throat. Negative for ear pain and trouble swallowing.   Eyes: Negative for discharge and redness.  Respiratory: Positive for cough. Negative for chest tightness and shortness of breath.   Cardiovascular: Negative for chest pain.  Gastrointestinal: Positive for diarrhea. Negative for abdominal pain, nausea and vomiting.  Musculoskeletal: Negative for myalgias.  Skin: Negative for rash.  Neurological: Positive for headaches. Negative for dizziness and light-headedness.     Physical Exam Triage Vital Signs ED Triage Vitals  Enc Vitals Group     BP 05/28/20 1334 118/79     Pulse Rate 05/28/20 1334 96     Resp 05/28/20 1334 20     Temp 05/28/20 1334 100.1 F (37.8 C)     Temp Source 05/28/20 1334 Oral     SpO2 05/28/20 1334 96 %     Weight 05/28/20 1338 240 lb (108.9 kg)     Height 05/28/20 1338 6' (1.829 m)     Head Circumference --      Peak Flow --      Pain Score 05/28/20 1334 8     Pain Loc --      Pain Edu? --      Excl. in [place]? --    No data found.  Updated Vital Signs BP 118/79 (BP Location: Right Arm)   Pulse 96   Temp 100.1 F (37.8 C) (Oral)   Resp 20   Ht 6' (1.829 m)   Wt 240 lb (108.9 kg)   SpO2 96%   BMI 32.55 kg/m   Visual Acuity Right Eye Distance:   Left Eye Distance:   Bilateral Distance:    Right Eye Near:   Left Eye Near:    Bilateral Near:     Physical Exam Vitals and nursing note reviewed.  Constitutional:      Appearance: He is well-developed.      Comments: No acute distress  HENT:     Head: Normocephalic and atraumatic.     Ears:     Comments: Bilateral ears without tenderness to palpation of external auricle, tragus and mastoid, EAC's without erythema or swelling, TM's with good bony landmarks and cone of light. Non  erythematous.     Nose: Nose normal.     Mouth/Throat:     Comments: Oral mucosa pink and moist, no tonsillar enlargement or exudate. Posterior pharynx patent and nonerythematous, no uvula deviation or swelling. Normal phonation. Eyes:     Conjunctiva/sclera: Conjunctivae normal.  Cardiovascular:     Rate and Rhythm: Normal rate.  Pulmonary:     Effort: Pulmonary effort is normal. No respiratory distress.     Comments: Breathing comfortably at rest, CTABL, no wheezing, rales or other adventitious sounds auscultated Abdominal:     General: There is no distension.  Musculoskeletal:        General: Normal range of motion.     Cervical back: Neck supple.  Skin:    General: Skin is warm and dry.  Neurological:     Mental Status: He is alert and oriented to person, place, and time.      UC Treatments / Results  Labs (all labs ordered are listed, but only abnormal results are displayed) Labs Reviewed  NOVEL CORONAVIRUS, NAA    EKG   Radiology No results found.  Procedures Procedures (including critical care time)  Medications Ordered in UC Medications  acetaminophen (TYLENOL) tablet 650 mg (650 mg Oral Given 05/28/20 1347)    Initial Impression / Assessment and Plan / UC Course  I have reviewed the triage vital signs and the nursing notes.  Pertinent labs & imaging results that were available during my care of the patient were reviewed by me and considered in my medical decision making (see chart for details).     Covid test pending, suspect likely viral URI, exam reassuring today.  Recommending rest fluids and symptomatic and supportive care.  Provide Tessalon for cough, Mucinex or may continue  with Coricidin.  Discussed strict return precautions. Patient verbalized understanding and is agreeable with plan.  Final Clinical Impressions(s) / UC Diagnoses   Final diagnoses:  Viral URI with cough  Diarrhea, unspecified type     Discharge Instructions     Covid test pending, monitor my chart for results Tessalon/benzonatate every 8 hours for cough May use Mucinex DM or over-the-counter Coricidin as needed for congestion Rest and fluids Follow-up if not improving or worsening    ED Prescriptions    Medication Sig Dispense Auth. Provider   benzonatate (TESSALON) 200 MG capsule Take 1 capsule (200 mg total) by mouth 3 (three) times daily as needed for up to 7 days for cough. 28 capsule Danial Sisley C, PA-C   dextromethorphan-guaiFENesin (MUCINEX DM) 30-600 MG 12hr tablet Take 1 tablet by mouth 2 (two) times daily. 30 tablet Mischell Branford, [place] C, PA-C     PDMP not reviewed this encounter.   Olon, Russ, PA-C 05/28/20 1441

## 2020-05-28 NOTE — ED Triage Notes (Signed)
Pt also reports he had diarrhea all day yesterday

## 2020-05-28 NOTE — ED Triage Notes (Signed)
Pt reports he has a cough,sore throat and fever. Pt did not check temp at HOME.

## 2020-05-29 LAB — SARS-COV-2, NAA 2 DAY TAT

## 2020-05-29 LAB — NOVEL CORONAVIRUS, NAA: SARS-CoV-2, NAA: DETECTED — AB

## 2020-05-30 ENCOUNTER — Other Ambulatory Visit: Payer: Self-pay | Admitting: Nurse Practitioner

## 2020-05-30 ENCOUNTER — Ambulatory Visit (HOSPITAL_COMMUNITY): Payer: Medicare HMO

## 2020-05-30 DIAGNOSIS — N184 Chronic kidney disease, stage 4 (severe): Secondary | ICD-10-CM

## 2020-05-30 DIAGNOSIS — Z6832 Body mass index (BMI) 32.0-32.9, adult: Secondary | ICD-10-CM | POA: Insufficient documentation

## 2020-05-30 DIAGNOSIS — G4733 Obstructive sleep apnea (adult) (pediatric): Secondary | ICD-10-CM

## 2020-05-30 DIAGNOSIS — Z720 Tobacco use: Secondary | ICD-10-CM

## 2020-05-30 DIAGNOSIS — U071 COVID-19: Secondary | ICD-10-CM

## 2020-05-30 DIAGNOSIS — I7100 Dissection of unspecified site of aorta: Secondary | ICD-10-CM

## 2020-05-30 DIAGNOSIS — I10 Essential (primary) hypertension: Secondary | ICD-10-CM

## 2020-05-30 DIAGNOSIS — I671 Cerebral aneurysm, nonruptured: Secondary | ICD-10-CM | POA: Insufficient documentation

## 2020-05-30 NOTE — Progress Notes (Signed)
I connected by phone with [name] Downs on 05/30/2020 at 9:18 AM to discuss the potential use of a new treatment for mild to moderate COVID-19 viral infection in non-hospitalized patients.  This patient is a 54 y.o. male that meets the FDA criteria for Emergency Use Authorization of COVID monoclonal antibody casirivimab/imdevimab or bamlanivimab/eteseviamb.  Has a (+) direct SARS-CoV-2 viral test result  Has mild or moderate COVID-19   Is NOT hospitalized due to COVID-19  Is within 10 days of symptom onset  Has at least one of the high risk factor(s) for progression to severe COVID-19 and/or hospitalization as defined in EUA.  Specific high risk criteria : BMI > 25, Chronic Kidney Disease (CKD), Cardiovascular disease or hypertension, Chronic Lung Disease and Other high risk medical condition per CDC:  Current smoker, Aortic dissection, Brain Aneurysm   Sx onset 10/11   I have spoken and communicated the following to the patient or parent/caregiver regarding COVID monoclonal antibody treatment:  1. FDA has authorized the emergency use for the treatment of mild to moderate COVID-19 in adults and pediatric patients with positive results of direct SARS-CoV-2 viral testing who are 82 years of age and older weighing at least 40 kg, and who are at high risk for progressing to severe COVID-19 and/or hospitalization.  2. The significant known and potential risks and benefits of COVID monoclonal antibody, and the extent to which such potential risks and benefits are unknown.  3. Information on available alternative treatments and the risks and benefits of those alternatives, including clinical trials.  4. Patients treated with COVID monoclonal antibody should continue to self-isolate and use infection control measures (e.g., wear mask, isolate, social distance, avoid sharing personal items, clean and disinfect "high touch" surfaces, and frequent handwashing) according to CDC guidelines.   5. The  patient or parent/caregiver has the option to accept or refuse COVID monoclonal antibody treatment.  After reviewing this information with the patient, the patient has agreed to receive one of the available covid 19 monoclonal antibodies and will be provided an appropriate fact sheet prior to infusion. [name], NP 05/30/2020 9:18 AM

## 2020-05-31 ENCOUNTER — Ambulatory Visit (HOSPITAL_COMMUNITY)
Admission: RE | Admit: 2020-05-31 | Discharge: 2020-05-31 | Disposition: A | Discharge status: Home / Self Care | Payer: Medicare Other | Source: Ambulatory Visit | Attending: Pulmonary Disease | Admitting: Pulmonary Disease

## 2020-05-31 DIAGNOSIS — Z720 Tobacco use: Secondary | ICD-10-CM

## 2020-05-31 DIAGNOSIS — I10 Essential (primary) hypertension: Secondary | ICD-10-CM | POA: Diagnosis present

## 2020-05-31 DIAGNOSIS — Z23 Encounter for immunization: Secondary | ICD-10-CM | POA: Insufficient documentation

## 2020-05-31 DIAGNOSIS — I671 Cerebral aneurysm, nonruptured: Secondary | ICD-10-CM | POA: Insufficient documentation

## 2020-05-31 DIAGNOSIS — I7100 Dissection of unspecified site of aorta: Secondary | ICD-10-CM | POA: Insufficient documentation

## 2020-05-31 DIAGNOSIS — G4733 Obstructive sleep apnea (adult) (pediatric): Secondary | ICD-10-CM | POA: Diagnosis present

## 2020-05-31 DIAGNOSIS — U071 COVID-19: Secondary | ICD-10-CM | POA: Diagnosis present

## 2020-05-31 DIAGNOSIS — Z6832 Body mass index (BMI) 32.0-32.9, adult: Secondary | ICD-10-CM | POA: Diagnosis present

## 2020-05-31 DIAGNOSIS — N184 Chronic kidney disease, stage 4 (severe): Secondary | ICD-10-CM | POA: Insufficient documentation

## 2020-05-31 MED ORDER — EPINEPHRINE 0.3 MG/0.3ML IJ SOAJ: 0.3000 mg | Freq: Once | INTRAMUSCULAR | Status: DC | PRN

## 2020-05-31 MED ORDER — FAMOTIDINE IN NACL 20-0.9 MG/50ML-% IV SOLN: 20.0000 mg | Freq: Once | INTRAVENOUS | Status: DC | PRN

## 2020-05-31 MED ORDER — DIPHENHYDRAMINE HCL 50 MG/ML IJ SOLN: 50.0000 mg | Freq: Once | INTRAMUSCULAR | Status: DC | PRN

## 2020-05-31 MED ORDER — SODIUM CHLORIDE 0.9 % IV SOLN: Freq: Once | INTRAVENOUS | Status: AC

## 2020-05-31 MED ORDER — SODIUM CHLORIDE 0.9 % IV SOLN: INTRAVENOUS | Status: DC | PRN

## 2020-05-31 MED ORDER — ALBUTEROL SULFATE HFA 108 (90 BASE) MCG/ACT IN AERS
2.0000 | INHALATION_SPRAY | Freq: Once | RESPIRATORY_TRACT | Status: DC | PRN
  Filled 2020-05-31: qty 6.7

## 2020-05-31 MED ORDER — METHYLPREDNISOLONE SODIUM SUCC 125 MG IJ SOLR: 125.0000 mg | Freq: Once | INTRAMUSCULAR | Status: DC | PRN

## 2020-05-31 NOTE — Discharge Instructions (Signed)

## 2020-05-31 NOTE — Progress Notes (Signed)
  Diagnosis: COVID-19  Physician:Dr Joya Gaskins  Procedure: Covid Infusion Clinic Med: bamlanivimab\etesevimab infusion - Provided patient with bamlanimivab\etesevimab fact sheet for patients, parents and caregivers prior to infusion.  Complications: No immediate complications noted.  Discharge: Discharged home   [place], [place] 05/31/2020

## 2020-06-16 DIAGNOSIS — Z720 Tobacco use: Secondary | ICD-10-CM | POA: Diagnosis not present

## 2020-06-16 DIAGNOSIS — N185 Chronic kidney disease, stage 5: Secondary | ICD-10-CM | POA: Diagnosis not present

## 2020-06-16 DIAGNOSIS — I120 Hypertensive chronic kidney disease with stage 5 chronic kidney disease or end stage renal disease: Secondary | ICD-10-CM | POA: Diagnosis not present

## 2020-06-16 DIAGNOSIS — E785 Hyperlipidemia, unspecified: Secondary | ICD-10-CM | POA: Diagnosis not present

## 2020-06-22 DIAGNOSIS — N185 Chronic kidney disease, stage 5: Secondary | ICD-10-CM | POA: Diagnosis not present

## 2020-06-25 DIAGNOSIS — N185 Chronic kidney disease, stage 5: Secondary | ICD-10-CM | POA: Diagnosis not present

## 2020-06-25 DIAGNOSIS — I770 Arteriovenous fistula, acquired: Secondary | ICD-10-CM | POA: Diagnosis not present

## 2020-06-25 DIAGNOSIS — N2581 Secondary hyperparathyroidism of renal origin: Secondary | ICD-10-CM | POA: Diagnosis not present

## 2020-06-25 DIAGNOSIS — D631 Anemia in chronic kidney disease: Secondary | ICD-10-CM | POA: Diagnosis not present

## 2020-06-25 DIAGNOSIS — Z23 Encounter for immunization: Secondary | ICD-10-CM | POA: Diagnosis not present

## 2020-06-25 DIAGNOSIS — I120 Hypertensive chronic kidney disease with stage 5 chronic kidney disease or end stage renal disease: Secondary | ICD-10-CM | POA: Diagnosis not present

## 2020-06-25 DIAGNOSIS — R809 Proteinuria, unspecified: Secondary | ICD-10-CM | POA: Diagnosis not present

## 2020-06-25 DIAGNOSIS — T82858A Stenosis of vascular prosthetic devices, implants and grafts, initial encounter: Secondary | ICD-10-CM | POA: Diagnosis not present

## 2020-06-25 DIAGNOSIS — F17200 Nicotine dependence, unspecified, uncomplicated: Secondary | ICD-10-CM | POA: Diagnosis not present

## 2020-07-17 DIAGNOSIS — Z720 Tobacco use: Secondary | ICD-10-CM | POA: Diagnosis not present

## 2020-07-17 DIAGNOSIS — E785 Hyperlipidemia, unspecified: Secondary | ICD-10-CM | POA: Diagnosis not present

## 2020-07-17 DIAGNOSIS — I120 Hypertensive chronic kidney disease with stage 5 chronic kidney disease or end stage renal disease: Secondary | ICD-10-CM | POA: Diagnosis not present

## 2020-07-17 DIAGNOSIS — N185 Chronic kidney disease, stage 5: Secondary | ICD-10-CM | POA: Diagnosis not present

## 2020-08-03 DIAGNOSIS — T82858A Stenosis of vascular prosthetic devices, implants and grafts, initial encounter: Secondary | ICD-10-CM | POA: Diagnosis not present

## 2020-08-03 DIAGNOSIS — I120 Hypertensive chronic kidney disease with stage 5 chronic kidney disease or end stage renal disease: Secondary | ICD-10-CM | POA: Diagnosis not present

## 2020-08-03 DIAGNOSIS — N529 Male erectile dysfunction, unspecified: Secondary | ICD-10-CM | POA: Diagnosis not present

## 2020-08-03 DIAGNOSIS — N2581 Secondary hyperparathyroidism of renal origin: Secondary | ICD-10-CM | POA: Diagnosis not present

## 2020-08-03 DIAGNOSIS — N185 Chronic kidney disease, stage 5: Secondary | ICD-10-CM | POA: Diagnosis not present

## 2020-08-03 DIAGNOSIS — I770 Arteriovenous fistula, acquired: Secondary | ICD-10-CM | POA: Diagnosis not present

## 2020-08-03 DIAGNOSIS — F17200 Nicotine dependence, unspecified, uncomplicated: Secondary | ICD-10-CM | POA: Diagnosis not present

## 2020-08-03 DIAGNOSIS — D631 Anemia in chronic kidney disease: Secondary | ICD-10-CM | POA: Diagnosis not present

## 2020-08-03 DIAGNOSIS — R809 Proteinuria, unspecified: Secondary | ICD-10-CM | POA: Diagnosis not present

## 2020-08-17 DIAGNOSIS — Z720 Tobacco use: Secondary | ICD-10-CM | POA: Diagnosis not present

## 2020-08-17 DIAGNOSIS — E785 Hyperlipidemia, unspecified: Secondary | ICD-10-CM | POA: Diagnosis not present

## 2020-08-17 DIAGNOSIS — N185 Chronic kidney disease, stage 5: Secondary | ICD-10-CM | POA: Diagnosis not present

## 2020-08-17 DIAGNOSIS — I120 Hypertensive chronic kidney disease with stage 5 chronic kidney disease or end stage renal disease: Secondary | ICD-10-CM | POA: Diagnosis not present

## 2020-09-13 DIAGNOSIS — N185 Chronic kidney disease, stage 5: Secondary | ICD-10-CM | POA: Diagnosis not present

## 2020-09-15 DIAGNOSIS — N185 Chronic kidney disease, stage 5: Secondary | ICD-10-CM | POA: Diagnosis not present

## 2020-09-15 DIAGNOSIS — Z720 Tobacco use: Secondary | ICD-10-CM | POA: Diagnosis not present

## 2020-09-15 DIAGNOSIS — E785 Hyperlipidemia, unspecified: Secondary | ICD-10-CM | POA: Diagnosis not present

## 2020-09-15 DIAGNOSIS — I120 Hypertensive chronic kidney disease with stage 5 chronic kidney disease or end stage renal disease: Secondary | ICD-10-CM | POA: Diagnosis not present

## 2020-09-20 DIAGNOSIS — N185 Chronic kidney disease, stage 5: Secondary | ICD-10-CM | POA: Diagnosis not present

## 2020-09-20 DIAGNOSIS — I770 Arteriovenous fistula, acquired: Secondary | ICD-10-CM | POA: Diagnosis not present

## 2020-09-20 DIAGNOSIS — N2581 Secondary hyperparathyroidism of renal origin: Secondary | ICD-10-CM | POA: Diagnosis not present

## 2020-09-20 DIAGNOSIS — R69 Illness, unspecified: Secondary | ICD-10-CM | POA: Diagnosis not present

## 2020-09-20 DIAGNOSIS — D631 Anemia in chronic kidney disease: Secondary | ICD-10-CM | POA: Diagnosis not present

## 2020-09-20 DIAGNOSIS — I120 Hypertensive chronic kidney disease with stage 5 chronic kidney disease or end stage renal disease: Secondary | ICD-10-CM | POA: Diagnosis not present

## 2020-09-20 DIAGNOSIS — N529 Male erectile dysfunction, unspecified: Secondary | ICD-10-CM | POA: Diagnosis not present

## 2020-09-20 DIAGNOSIS — T82858A Stenosis of vascular prosthetic devices, implants and grafts, initial encounter: Secondary | ICD-10-CM | POA: Diagnosis not present

## 2020-09-20 DIAGNOSIS — R809 Proteinuria, unspecified: Secondary | ICD-10-CM | POA: Diagnosis not present

## 2020-09-25 DIAGNOSIS — I120 Hypertensive chronic kidney disease with stage 5 chronic kidney disease or end stage renal disease: Secondary | ICD-10-CM | POA: Diagnosis not present

## 2020-09-25 DIAGNOSIS — I129 Hypertensive chronic kidney disease with stage 1 through stage 4 chronic kidney disease, or unspecified chronic kidney disease: Secondary | ICD-10-CM | POA: Diagnosis not present

## 2020-09-25 DIAGNOSIS — I10 Essential (primary) hypertension: Secondary | ICD-10-CM | POA: Diagnosis not present

## 2020-09-25 DIAGNOSIS — M130 Polyarthritis, unspecified: Secondary | ICD-10-CM | POA: Diagnosis not present

## 2020-09-25 DIAGNOSIS — N184 Chronic kidney disease, stage 4 (severe): Secondary | ICD-10-CM | POA: Diagnosis not present

## 2020-10-04 ENCOUNTER — Other Ambulatory Visit: Payer: Self-pay

## 2020-10-04 ENCOUNTER — Ambulatory Visit
Admission: EM | Admit: 2020-10-04 | Discharge: 2020-10-04 | Disposition: A | Discharge status: Home / Self Care | Payer: Medicare HMO | Attending: Family Medicine | Admitting: Family Medicine

## 2020-10-04 DIAGNOSIS — R1013 Epigastric pain: Secondary | ICD-10-CM | POA: Diagnosis not present

## 2020-10-04 DIAGNOSIS — K219 Gastro-esophageal reflux disease without esophagitis: Secondary | ICD-10-CM

## 2020-10-04 MED ORDER — ESOMEPRAZOLE MAGNESIUM 40 MG PO CPDR: 40.0000 mg | DELAYED_RELEASE_CAPSULE | Freq: Every day | ORAL | 0 refills | Status: DC

## 2020-10-04 NOTE — ED Provider Notes (Signed)
[facility]   [id number] 10/04/20 Arrival Time: 0913  ASSESSMENT & PLAN:  1. Epigastric pain   2. Gastroesophageal reflux disease without esophagitis     Benign abdominal exam. No indications for urgent abdominal/pelvic imaging at this time. Discussed. No s/s consistent with mesenteric ischemia.  Begin trial of: Meds ordered this encounter  Medications  . esomeprazole (NEXIUM) 40 MG capsule    Sig: Take 1 capsule (40 mg total) by mouth daily.    Dispense:  14 capsule    Refill:  0     Discharge Instructions     You have been seen today for abdominal pain. Your evaluation was not suggestive of any emergent condition requiring medical intervention at this time. However, some abdominal problems make take more time to appear. Therefore, it is very important for you to pay attention to any new symptoms or worsening of your current condition.  Please return here or to the Emergency Department immediately should you begin to feel worse in any way or have any of the following symptoms: increasing or different abdominal pain, persistent vomiting, inability to drink fluids, fevers, or shaking chills.       Follow-up Information    [facility].   Specialty: Emergency Medicine Why: If symptoms worsen in any way. Contact information: [address] [id number] [place] [phone]              Reviewed expectations re: course of current medical issues. Questions answered. Outlined signs and symptoms indicating need for more acute intervention. Patient verbalized understanding. After Visit Summary given.   SUBJECTIVE: History from: patient. [name] Downs is a 55 y.o. male who presents with complaint of intermittent epigastric abdominal pain. Onset gradual, first noted a couple of weeks ago. Discomfort described as burning and dull; without radiation; occasionally wakes him at night; much  belching; TUMS helps a little; pain gradually resolves; able to fall back asleep. Reports normal flatus. Symptoms are stable since beginning. Fever: absent. Aggravating factors: include laying down after meal. Alleviating factors: have not been identified except as mentioned above. He denies chills, diarrhea, dysuria, fever, headache, melena, myalgias and sweats. Appetite: normal; occasional and mild nausea. PO intake: normal. Ambulatory without assistance. Urinary symptoms: none. Bowel movements: have not significantly changed; occasional constipation. History of similar: no.  Past Surgical History:  Procedure Laterality Date  . AV FISTULA PLACEMENT Right 12/17/2015   Procedure: CREATION OF RADIOCEPHALIC  ARTERIOVENOUS (AV) FISTULA RIGHT LOWER  ARM;  Surgeon: Elam Dutch, MD;  Location: [facility] OR;  Service: Vascular;  Laterality: Right;  . AV FISTULA PLACEMENT Right 12/17/2015   Procedure: CREATION OF RIGHT UPPER ARM  BRACHIO CEPHALIC ARTERIOVENOUS (AV) FISTULA;  Surgeon: Elam Dutch, MD;  Location: [facility];  Service: Vascular;  Laterality: Right;  . AV FISTULA PLACEMENT Left 09/08/2017   Procedure: ARTERIOVENOUS (AV) FISTULA CREATION LEFT BRACHIOCEPHALIC;  Surgeon: Elam Dutch, MD;  Location: [facility] OR;  Service: Vascular;  Laterality: Left;  . AV FISTULA PLACEMENT Left 11/23/2019   Procedure: LEFT ARM BASILIC VEIN (AV) TRANSPOSITION;  Surgeon: Rosetta Posner, MD;  Location: [facility];  Service: Vascular;  Laterality: Left;  . BACK SURGERY       OBJECTIVE:  Vitals:   10/04/20 1000 10/04/20 1029  BP: (!) 152/85   Pulse: 82   Resp: 20   Temp: 97.9 F (36.6 C)   TempSrc: Oral   SpO2: 98% 98%    General appearance: alert,  oriented, no acute distress HEENT: [place]; AT; oropharynx moist Lungs: unlabored respirations Abdomen: soft; without distention; no specific tenderness to palpation; normal bowel sounds; without masses or organomegaly; without guarding or rebound tenderness Back: without  reported CVA tenderness; FROM at waist Extremities: without LE edema; symmetrical; without gross deformities Skin: warm and dry Neurologic: normal gait Psychological: alert and cooperative; normal mood and affect    No Known Allergies                                             Past Medical History:  Diagnosis Date  . Aortic dissection (HCC)    type B  . Brain aneurysm   . Chronic kidney disease   . Deafness in left ear   . Gout   . Hypertension   . Medically noncompliant   . Sleep apnea     does not wears CPAP  . Wears glasses     Social History   Socioeconomic History  . Marital status: Single    Spouse name: Not on file  . Number of children: Not on file  . Years of education: Not on file  . Highest education level: Not on file  Occupational History  . Not on file  Tobacco Use  . Smoking status: Current Some Day Smoker    Packs/day: 0.25    Years: 20.00    Pack years: 5.00    Types: Cigarettes  . Smokeless tobacco: Former Systems developer    Types: Secondary school teacher  . Vaping Use: Never used  Substance and Sexual Activity  . Alcohol use: No    Alcohol/week: 0.0 standard drinks  . Drug use: No  . Sexual activity: Yes  Other Topics Concern  . Not on file  Social History Narrative  . Not on file   Social Determinants of Health   Financial Resource Strain: Not on file  Food Insecurity: Not on file  Transportation Needs: Not on file  Physical Activity: Not on file  Stress: Not on file  Social Connections: Not on file  Intimate Partner Violence: Not on file    Family History  Problem Relation Age of Onset  . Alzheimer's disease Mother   . Cirrhosis Father   . Heart attack Brother      Vanessa Kick, MD 10/04/20 1046

## 2020-10-04 NOTE — ED Triage Notes (Signed)
Patient states he has had abdominal pain x 2 weeks and it is worse when he lays down to go to sleep. Pt states if he burps it feels better for a bit. Pt also states he feels like it is acid reflux. Pt is aox4 and ambulatory.

## 2020-10-04 NOTE — Discharge Instructions (Addendum)

## 2020-10-15 DIAGNOSIS — Z720 Tobacco use: Secondary | ICD-10-CM | POA: Diagnosis not present

## 2020-10-15 DIAGNOSIS — I120 Hypertensive chronic kidney disease with stage 5 chronic kidney disease or end stage renal disease: Secondary | ICD-10-CM | POA: Diagnosis not present

## 2020-10-15 DIAGNOSIS — E785 Hyperlipidemia, unspecified: Secondary | ICD-10-CM | POA: Diagnosis not present

## 2020-10-15 DIAGNOSIS — N185 Chronic kidney disease, stage 5: Secondary | ICD-10-CM | POA: Diagnosis not present

## 2020-10-16 DIAGNOSIS — I7101 Dissection of thoracic aorta: Secondary | ICD-10-CM | POA: Diagnosis not present

## 2020-10-16 DIAGNOSIS — R918 Other nonspecific abnormal finding of lung field: Secondary | ICD-10-CM | POA: Diagnosis not present

## 2020-10-24 DIAGNOSIS — N185 Chronic kidney disease, stage 5: Secondary | ICD-10-CM | POA: Diagnosis not present

## 2020-10-26 DIAGNOSIS — T82858A Stenosis of vascular prosthetic devices, implants and grafts, initial encounter: Secondary | ICD-10-CM | POA: Diagnosis not present

## 2020-10-26 DIAGNOSIS — N529 Male erectile dysfunction, unspecified: Secondary | ICD-10-CM | POA: Diagnosis not present

## 2020-10-26 DIAGNOSIS — R69 Illness, unspecified: Secondary | ICD-10-CM | POA: Diagnosis not present

## 2020-10-26 DIAGNOSIS — I770 Arteriovenous fistula, acquired: Secondary | ICD-10-CM | POA: Diagnosis not present

## 2020-10-26 DIAGNOSIS — R809 Proteinuria, unspecified: Secondary | ICD-10-CM | POA: Diagnosis not present

## 2020-10-26 DIAGNOSIS — I120 Hypertensive chronic kidney disease with stage 5 chronic kidney disease or end stage renal disease: Secondary | ICD-10-CM | POA: Diagnosis not present

## 2020-10-26 DIAGNOSIS — D631 Anemia in chronic kidney disease: Secondary | ICD-10-CM | POA: Diagnosis not present

## 2020-10-26 DIAGNOSIS — N2581 Secondary hyperparathyroidism of renal origin: Secondary | ICD-10-CM | POA: Diagnosis not present

## 2020-10-26 DIAGNOSIS — N185 Chronic kidney disease, stage 5: Secondary | ICD-10-CM | POA: Diagnosis not present

## 2020-11-01 DIAGNOSIS — H25813 Combined forms of age-related cataract, bilateral: Secondary | ICD-10-CM | POA: Diagnosis not present

## 2020-11-01 DIAGNOSIS — H52223 Regular astigmatism, bilateral: Secondary | ICD-10-CM | POA: Diagnosis not present

## 2020-11-01 DIAGNOSIS — H5213 Myopia, bilateral: Secondary | ICD-10-CM | POA: Diagnosis not present

## 2020-11-01 DIAGNOSIS — H524 Presbyopia: Secondary | ICD-10-CM | POA: Diagnosis not present

## 2020-11-15 DIAGNOSIS — E785 Hyperlipidemia, unspecified: Secondary | ICD-10-CM | POA: Diagnosis not present

## 2020-11-15 DIAGNOSIS — N185 Chronic kidney disease, stage 5: Secondary | ICD-10-CM | POA: Diagnosis not present

## 2020-11-15 DIAGNOSIS — I120 Hypertensive chronic kidney disease with stage 5 chronic kidney disease or end stage renal disease: Secondary | ICD-10-CM | POA: Diagnosis not present

## 2020-11-16 DIAGNOSIS — Z0100 Encounter for examination of eyes and vision without abnormal findings: Secondary | ICD-10-CM | POA: Diagnosis not present

## 2020-12-01 DIAGNOSIS — N186 End stage renal disease: Secondary | ICD-10-CM | POA: Diagnosis not present

## 2020-12-01 DIAGNOSIS — J309 Allergic rhinitis, unspecified: Secondary | ICD-10-CM | POA: Diagnosis not present

## 2020-12-01 DIAGNOSIS — R609 Edema, unspecified: Secondary | ICD-10-CM | POA: Diagnosis not present

## 2020-12-01 DIAGNOSIS — I130 Hypertensive heart and chronic kidney disease with heart failure and stage 1 through stage 4 chronic kidney disease, or unspecified chronic kidney disease: Secondary | ICD-10-CM | POA: Diagnosis not present

## 2020-12-01 DIAGNOSIS — Z87891 Personal history of nicotine dependence: Secondary | ICD-10-CM | POA: Diagnosis not present

## 2020-12-01 DIAGNOSIS — E669 Obesity, unspecified: Secondary | ICD-10-CM | POA: Diagnosis not present

## 2020-12-01 DIAGNOSIS — M109 Gout, unspecified: Secondary | ICD-10-CM | POA: Diagnosis not present

## 2020-12-01 DIAGNOSIS — Z992 Dependence on renal dialysis: Secondary | ICD-10-CM | POA: Diagnosis not present

## 2020-12-01 DIAGNOSIS — Z79899 Other long term (current) drug therapy: Secondary | ICD-10-CM | POA: Diagnosis not present

## 2020-12-01 DIAGNOSIS — Z6833 Body mass index (BMI) 33.0-33.9, adult: Secondary | ICD-10-CM | POA: Diagnosis not present

## 2020-12-05 DIAGNOSIS — N185 Chronic kidney disease, stage 5: Secondary | ICD-10-CM | POA: Diagnosis not present

## 2020-12-13 DIAGNOSIS — N529 Male erectile dysfunction, unspecified: Secondary | ICD-10-CM | POA: Diagnosis not present

## 2020-12-13 DIAGNOSIS — N2581 Secondary hyperparathyroidism of renal origin: Secondary | ICD-10-CM | POA: Diagnosis not present

## 2020-12-13 DIAGNOSIS — I120 Hypertensive chronic kidney disease with stage 5 chronic kidney disease or end stage renal disease: Secondary | ICD-10-CM | POA: Diagnosis not present

## 2020-12-13 DIAGNOSIS — R809 Proteinuria, unspecified: Secondary | ICD-10-CM | POA: Diagnosis not present

## 2020-12-13 DIAGNOSIS — I770 Arteriovenous fistula, acquired: Secondary | ICD-10-CM | POA: Diagnosis not present

## 2020-12-13 DIAGNOSIS — D631 Anemia in chronic kidney disease: Secondary | ICD-10-CM | POA: Diagnosis not present

## 2020-12-13 DIAGNOSIS — T82858A Stenosis of vascular prosthetic devices, implants and grafts, initial encounter: Secondary | ICD-10-CM | POA: Diagnosis not present

## 2020-12-13 DIAGNOSIS — R69 Illness, unspecified: Secondary | ICD-10-CM | POA: Diagnosis not present

## 2020-12-13 DIAGNOSIS — N185 Chronic kidney disease, stage 5: Secondary | ICD-10-CM | POA: Diagnosis not present

## 2020-12-27 ENCOUNTER — Other Ambulatory Visit: Payer: Self-pay

## 2020-12-27 ENCOUNTER — Encounter: Payer: Self-pay | Admitting: Emergency Medicine

## 2020-12-27 ENCOUNTER — Ambulatory Visit
Admission: EM | Admit: 2020-12-27 | Discharge: 2020-12-27 | Disposition: A | Discharge status: Home / Self Care | Payer: Medicare HMO | Attending: Family Medicine | Admitting: Family Medicine

## 2020-12-27 DIAGNOSIS — R059 Cough, unspecified: Secondary | ICD-10-CM | POA: Diagnosis not present

## 2020-12-27 DIAGNOSIS — J029 Acute pharyngitis, unspecified: Secondary | ICD-10-CM

## 2020-12-27 DIAGNOSIS — Z7689 Persons encountering health services in other specified circumstances: Secondary | ICD-10-CM | POA: Diagnosis not present

## 2020-12-27 NOTE — Discharge Instructions (Signed)
You have been tested for COVID-19 today. °If your test returns positive, you will receive a phone call from [facility] regarding your results. °Negative test results are not called. °Both positive and negative results area always visible on MyChart. °If you do not have a MyChart account, sign up instructions are provided in your discharge papers. °Please do not hesitate to contact us should you have questions or concerns. ° °

## 2020-12-27 NOTE — ED Provider Notes (Signed)
[facility]   [id number] 12/27/20 Arrival Time: 0902  ASSESSMENT & PLAN:  1. Cough   2. Sore throat    COVID-19 testing sent. OTC symptom care as needed.    Follow-up Information    [name], MD.   Specialty: Family Medicine Why: As needed. Contact information: [address] [place] [address] [phone]        [facility] .   Specialty: Urgent Care Why: If worsening or failing to improve as anticipated. Contact information: [address] [place] [id number] [place] [phone]              Reviewed expectations re: course of current medical issues. Questions answered. Outlined signs and symptoms indicating need for more acute intervention. Understanding verbalized. After Visit Summary given.   SUBJECTIVE: History from: patient. [name] Downs is a 55 y.o. male who reports cough and ST; abrupt onset; yesterday. Wife with COVID last week. Desires COVID testing. Denies: fever and difficulty breathing. Normal PO intake without n/v/d.    OBJECTIVE:  Vitals:   12/27/20 0913  BP: 136/82  Pulse: 92  Resp: 18  Temp: 98.4 F (36.9 C)  TempSrc: Oral  SpO2: 96%    General appearance: alert; no distress Eyes: PERRLA; EOMI; conjunctiva normal HENT: [place]; AT; with mild nasal congestion; throat mild irritation Neck: supple  Lungs: unlabored Extremities: no edema Skin: warm and dry Neurologic: normal gait Psychological: alert and cooperative; normal mood and affect  Labs:  Labs Reviewed  NOVEL CORONAVIRUS, NAA    No Known Allergies  Past Medical History:  Diagnosis Date  . Aortic dissection (HCC)    type B  . Brain aneurysm   . Chronic kidney disease   . Deafness in left ear   . Gout   . Hypertension   . Medically noncompliant   . Sleep apnea     does not wears CPAP  . Wears glasses    Social History   Socioeconomic History  . Marital status:  Single    Spouse name: Not on file  . Number of children: Not on file  . Years of education: Not on file  . Highest education level: Not on file  Occupational History  . Not on file  Tobacco Use  . Smoking status: Current Some Day Smoker    Packs/day: 0.25    Years: 20.00    Pack years: 5.00    Types: Cigarettes  . Smokeless tobacco: Former Systems developer    Types: Secondary school teacher  . Vaping Use: Never used  Substance and Sexual Activity  . Alcohol use: No    Alcohol/week: 0.0 standard drinks  . Drug use: No  . Sexual activity: Yes  Other Topics Concern  . Not on file  Social History Narrative  . Not on file   Social Determinants of Health   Financial Resource Strain: Not on file  Food Insecurity: Not on file  Transportation Needs: Not on file  Physical Activity: Not on file  Stress: Not on file  Social Connections: Not on file  Intimate Partner Violence: Not on file   Family History  Problem Relation Age of Onset  . Alzheimer's disease Mother   . Cirrhosis Father   . Heart attack Brother    Past Surgical History:  Procedure Laterality Date  . AV FISTULA PLACEMENT Right 12/17/2015   Procedure: CREATION OF RADIOCEPHALIC  ARTERIOVENOUS (AV) FISTULA RIGHT LOWER  ARM;  Surgeon: Elam Dutch, MD;  Location: [facility] OR;  Service: Vascular;  Laterality: Right;  . AV FISTULA PLACEMENT Right 12/17/2015   Procedure: CREATION OF RIGHT UPPER ARM  BRACHIO CEPHALIC ARTERIOVENOUS (AV) FISTULA;  Surgeon: Elam Dutch, MD;  Location: [facility] OR;  Service: Vascular;  Laterality: Right;  . AV FISTULA PLACEMENT Left 09/08/2017   Procedure: ARTERIOVENOUS (AV) FISTULA CREATION LEFT BRACHIOCEPHALIC;  Surgeon: Elam Dutch, MD;  Location: [facility];  Service: Vascular;  Laterality: Left;  . AV FISTULA PLACEMENT Left 11/23/2019   Procedure: LEFT ARM BASILIC VEIN (AV) TRANSPOSITION;  Surgeon: Rosetta Posner, MD;  Location: [facility];  Service: Vascular;  Laterality: Left;  . BACK SURGERY       Vanessa Kick,  MD 12/27/20 [phone]

## 2020-12-27 NOTE — ED Triage Notes (Signed)
Pt presents with sore throat and cough that started last pm.

## 2020-12-28 LAB — NOVEL CORONAVIRUS, NAA: SARS-CoV-2, NAA: DETECTED — AB

## 2020-12-28 LAB — SARS-COV-2, NAA 2 DAY TAT

## 2020-12-29 ENCOUNTER — Telehealth: Payer: Self-pay | Admitting: Oncology

## 2020-12-29 NOTE — Telephone Encounter (Signed)
Called to discuss with patient about COVID-19 symptoms and the use of one of the available treatments for those with mild to moderate Covid symptoms and at a high risk of hospitalization.  Pt appears to qualify for outpatient treatment due to co-morbid conditions and/or a member of an at-risk group in accordance with the FDA Emergency Use Authorization.    Symptom onset: 12/26/20 Vaccinated: unsure Booster? unsure Immunocompromised? No  Qualifiers:  Past Medical History:  Diagnosis Date  . Aortic dissection (HCC)    type B  . Brain aneurysm   . Chronic kidney disease   . Deafness in left ear   . Gout   . Hypertension   . Medically noncompliant   . Sleep apnea     does not wears CPAP  . Wears glasses     Unable to reach pt - Left VM- No mychart message available.   Jacquelin Hawking

## 2021-01-15 DIAGNOSIS — Z720 Tobacco use: Secondary | ICD-10-CM | POA: Diagnosis not present

## 2021-01-15 DIAGNOSIS — E785 Hyperlipidemia, unspecified: Secondary | ICD-10-CM | POA: Diagnosis not present

## 2021-01-15 DIAGNOSIS — N185 Chronic kidney disease, stage 5: Secondary | ICD-10-CM | POA: Diagnosis not present

## 2021-01-15 DIAGNOSIS — I120 Hypertensive chronic kidney disease with stage 5 chronic kidney disease or end stage renal disease: Secondary | ICD-10-CM | POA: Diagnosis not present

## 2021-01-22 DIAGNOSIS — I10 Essential (primary) hypertension: Secondary | ICD-10-CM | POA: Diagnosis not present

## 2021-01-22 DIAGNOSIS — I129 Hypertensive chronic kidney disease with stage 1 through stage 4 chronic kidney disease, or unspecified chronic kidney disease: Secondary | ICD-10-CM | POA: Diagnosis not present

## 2021-01-22 DIAGNOSIS — N185 Chronic kidney disease, stage 5: Secondary | ICD-10-CM | POA: Diagnosis not present

## 2021-01-22 DIAGNOSIS — D631 Anemia in chronic kidney disease: Secondary | ICD-10-CM | POA: Diagnosis not present

## 2021-01-22 DIAGNOSIS — G4733 Obstructive sleep apnea (adult) (pediatric): Secondary | ICD-10-CM | POA: Diagnosis not present

## 2021-01-29 DIAGNOSIS — J301 Allergic rhinitis due to pollen: Secondary | ICD-10-CM | POA: Diagnosis not present

## 2021-01-29 DIAGNOSIS — N189 Chronic kidney disease, unspecified: Secondary | ICD-10-CM | POA: Diagnosis not present

## 2021-01-31 DIAGNOSIS — I120 Hypertensive chronic kidney disease with stage 5 chronic kidney disease or end stage renal disease: Secondary | ICD-10-CM | POA: Diagnosis not present

## 2021-01-31 DIAGNOSIS — N185 Chronic kidney disease, stage 5: Secondary | ICD-10-CM | POA: Diagnosis not present

## 2021-01-31 DIAGNOSIS — D631 Anemia in chronic kidney disease: Secondary | ICD-10-CM | POA: Diagnosis not present

## 2021-01-31 DIAGNOSIS — N2581 Secondary hyperparathyroidism of renal origin: Secondary | ICD-10-CM | POA: Diagnosis not present

## 2021-02-14 DIAGNOSIS — N185 Chronic kidney disease, stage 5: Secondary | ICD-10-CM | POA: Diagnosis not present

## 2021-02-14 DIAGNOSIS — Z720 Tobacco use: Secondary | ICD-10-CM | POA: Diagnosis not present

## 2021-02-14 DIAGNOSIS — E785 Hyperlipidemia, unspecified: Secondary | ICD-10-CM | POA: Diagnosis not present

## 2021-02-14 DIAGNOSIS — I120 Hypertensive chronic kidney disease with stage 5 chronic kidney disease or end stage renal disease: Secondary | ICD-10-CM | POA: Diagnosis not present

## 2021-02-25 DIAGNOSIS — R809 Proteinuria, unspecified: Secondary | ICD-10-CM | POA: Diagnosis not present

## 2021-02-25 DIAGNOSIS — N529 Male erectile dysfunction, unspecified: Secondary | ICD-10-CM | POA: Diagnosis not present

## 2021-02-25 DIAGNOSIS — N185 Chronic kidney disease, stage 5: Secondary | ICD-10-CM | POA: Diagnosis not present

## 2021-02-25 DIAGNOSIS — R69 Illness, unspecified: Secondary | ICD-10-CM | POA: Diagnosis not present

## 2021-02-25 DIAGNOSIS — D631 Anemia in chronic kidney disease: Secondary | ICD-10-CM | POA: Diagnosis not present

## 2021-02-25 DIAGNOSIS — I770 Arteriovenous fistula, acquired: Secondary | ICD-10-CM | POA: Diagnosis not present

## 2021-02-25 DIAGNOSIS — I120 Hypertensive chronic kidney disease with stage 5 chronic kidney disease or end stage renal disease: Secondary | ICD-10-CM | POA: Diagnosis not present

## 2021-02-25 DIAGNOSIS — N2581 Secondary hyperparathyroidism of renal origin: Secondary | ICD-10-CM | POA: Diagnosis not present

## 2021-02-25 DIAGNOSIS — T82858A Stenosis of vascular prosthetic devices, implants and grafts, initial encounter: Secondary | ICD-10-CM | POA: Diagnosis not present

## 2021-02-28 DIAGNOSIS — N185 Chronic kidney disease, stage 5: Secondary | ICD-10-CM | POA: Diagnosis not present

## 2021-02-28 DIAGNOSIS — N189 Chronic kidney disease, unspecified: Secondary | ICD-10-CM | POA: Diagnosis not present

## 2021-03-05 DIAGNOSIS — D509 Iron deficiency anemia, unspecified: Secondary | ICD-10-CM | POA: Insufficient documentation

## 2021-03-05 DIAGNOSIS — Z0000 Encounter for general adult medical examination without abnormal findings: Secondary | ICD-10-CM | POA: Insufficient documentation

## 2021-03-05 DIAGNOSIS — R319 Hematuria, unspecified: Secondary | ICD-10-CM | POA: Insufficient documentation

## 2021-03-05 DIAGNOSIS — T782XXA Anaphylactic shock, unspecified, initial encounter: Secondary | ICD-10-CM | POA: Insufficient documentation

## 2021-03-05 DIAGNOSIS — N189 Chronic kidney disease, unspecified: Secondary | ICD-10-CM | POA: Insufficient documentation

## 2021-03-05 DIAGNOSIS — F17200 Nicotine dependence, unspecified, uncomplicated: Secondary | ICD-10-CM | POA: Insufficient documentation

## 2021-03-05 DIAGNOSIS — I770 Arteriovenous fistula, acquired: Secondary | ICD-10-CM | POA: Insufficient documentation

## 2021-03-05 DIAGNOSIS — T7840XA Allergy, unspecified, initial encounter: Secondary | ICD-10-CM | POA: Insufficient documentation

## 2021-03-05 DIAGNOSIS — R52 Pain, unspecified: Secondary | ICD-10-CM | POA: Insufficient documentation

## 2021-03-05 DIAGNOSIS — M1990 Unspecified osteoarthritis, unspecified site: Secondary | ICD-10-CM | POA: Insufficient documentation

## 2021-03-05 DIAGNOSIS — R809 Proteinuria, unspecified: Secondary | ICD-10-CM | POA: Insufficient documentation

## 2021-03-05 DIAGNOSIS — N529 Male erectile dysfunction, unspecified: Secondary | ICD-10-CM | POA: Insufficient documentation

## 2021-03-05 DIAGNOSIS — E785 Hyperlipidemia, unspecified: Secondary | ICD-10-CM | POA: Insufficient documentation

## 2021-03-05 DIAGNOSIS — E875 Hyperkalemia: Secondary | ICD-10-CM | POA: Insufficient documentation

## 2021-03-05 DIAGNOSIS — D631 Anemia in chronic kidney disease: Secondary | ICD-10-CM | POA: Insufficient documentation

## 2021-03-07 DIAGNOSIS — N2581 Secondary hyperparathyroidism of renal origin: Secondary | ICD-10-CM | POA: Diagnosis not present

## 2021-03-07 DIAGNOSIS — N186 End stage renal disease: Secondary | ICD-10-CM | POA: Diagnosis not present

## 2021-03-07 DIAGNOSIS — Z992 Dependence on renal dialysis: Secondary | ICD-10-CM | POA: Diagnosis not present

## 2021-03-09 DIAGNOSIS — N2581 Secondary hyperparathyroidism of renal origin: Secondary | ICD-10-CM | POA: Diagnosis not present

## 2021-03-09 DIAGNOSIS — Z992 Dependence on renal dialysis: Secondary | ICD-10-CM | POA: Diagnosis not present

## 2021-03-09 DIAGNOSIS — N186 End stage renal disease: Secondary | ICD-10-CM | POA: Diagnosis not present

## 2021-03-12 DIAGNOSIS — N186 End stage renal disease: Secondary | ICD-10-CM | POA: Diagnosis not present

## 2021-03-12 DIAGNOSIS — Z992 Dependence on renal dialysis: Secondary | ICD-10-CM | POA: Diagnosis not present

## 2021-03-12 DIAGNOSIS — N2581 Secondary hyperparathyroidism of renal origin: Secondary | ICD-10-CM | POA: Diagnosis not present

## 2021-03-12 DIAGNOSIS — D689 Coagulation defect, unspecified: Secondary | ICD-10-CM | POA: Diagnosis not present

## 2021-03-14 DIAGNOSIS — Z992 Dependence on renal dialysis: Secondary | ICD-10-CM | POA: Diagnosis not present

## 2021-03-14 DIAGNOSIS — D689 Coagulation defect, unspecified: Secondary | ICD-10-CM | POA: Diagnosis not present

## 2021-03-14 DIAGNOSIS — N2581 Secondary hyperparathyroidism of renal origin: Secondary | ICD-10-CM | POA: Diagnosis not present

## 2021-03-14 DIAGNOSIS — N186 End stage renal disease: Secondary | ICD-10-CM | POA: Diagnosis not present

## 2021-03-16 DIAGNOSIS — N2581 Secondary hyperparathyroidism of renal origin: Secondary | ICD-10-CM | POA: Diagnosis not present

## 2021-03-16 DIAGNOSIS — N186 End stage renal disease: Secondary | ICD-10-CM | POA: Diagnosis not present

## 2021-03-16 DIAGNOSIS — Z992 Dependence on renal dialysis: Secondary | ICD-10-CM | POA: Diagnosis not present

## 2021-03-16 DIAGNOSIS — D689 Coagulation defect, unspecified: Secondary | ICD-10-CM | POA: Diagnosis not present

## 2021-03-17 DIAGNOSIS — I129 Hypertensive chronic kidney disease with stage 1 through stage 4 chronic kidney disease, or unspecified chronic kidney disease: Secondary | ICD-10-CM | POA: Diagnosis not present

## 2021-03-17 DIAGNOSIS — Z992 Dependence on renal dialysis: Secondary | ICD-10-CM | POA: Diagnosis not present

## 2021-03-17 DIAGNOSIS — N186 End stage renal disease: Secondary | ICD-10-CM | POA: Diagnosis not present

## 2021-03-19 DIAGNOSIS — N2581 Secondary hyperparathyroidism of renal origin: Secondary | ICD-10-CM | POA: Diagnosis not present

## 2021-03-19 DIAGNOSIS — Z992 Dependence on renal dialysis: Secondary | ICD-10-CM | POA: Diagnosis not present

## 2021-03-19 DIAGNOSIS — N186 End stage renal disease: Secondary | ICD-10-CM | POA: Diagnosis not present

## 2021-03-19 DIAGNOSIS — D689 Coagulation defect, unspecified: Secondary | ICD-10-CM | POA: Diagnosis not present

## 2021-03-21 DIAGNOSIS — N2581 Secondary hyperparathyroidism of renal origin: Secondary | ICD-10-CM | POA: Diagnosis not present

## 2021-03-21 DIAGNOSIS — N186 End stage renal disease: Secondary | ICD-10-CM | POA: Diagnosis not present

## 2021-03-21 DIAGNOSIS — Z992 Dependence on renal dialysis: Secondary | ICD-10-CM | POA: Diagnosis not present

## 2021-03-21 DIAGNOSIS — D689 Coagulation defect, unspecified: Secondary | ICD-10-CM | POA: Diagnosis not present

## 2021-03-23 DIAGNOSIS — D689 Coagulation defect, unspecified: Secondary | ICD-10-CM | POA: Diagnosis not present

## 2021-03-23 DIAGNOSIS — N186 End stage renal disease: Secondary | ICD-10-CM | POA: Diagnosis not present

## 2021-03-23 DIAGNOSIS — N2581 Secondary hyperparathyroidism of renal origin: Secondary | ICD-10-CM | POA: Diagnosis not present

## 2021-03-23 DIAGNOSIS — Z992 Dependence on renal dialysis: Secondary | ICD-10-CM | POA: Diagnosis not present

## 2021-03-26 DIAGNOSIS — D689 Coagulation defect, unspecified: Secondary | ICD-10-CM | POA: Diagnosis not present

## 2021-03-26 DIAGNOSIS — N2581 Secondary hyperparathyroidism of renal origin: Secondary | ICD-10-CM | POA: Diagnosis not present

## 2021-03-26 DIAGNOSIS — Z992 Dependence on renal dialysis: Secondary | ICD-10-CM | POA: Diagnosis not present

## 2021-03-26 DIAGNOSIS — D631 Anemia in chronic kidney disease: Secondary | ICD-10-CM | POA: Diagnosis not present

## 2021-03-26 DIAGNOSIS — N186 End stage renal disease: Secondary | ICD-10-CM | POA: Diagnosis not present

## 2021-03-26 DIAGNOSIS — D509 Iron deficiency anemia, unspecified: Secondary | ICD-10-CM | POA: Diagnosis not present

## 2021-03-28 DIAGNOSIS — D631 Anemia in chronic kidney disease: Secondary | ICD-10-CM | POA: Diagnosis not present

## 2021-03-28 DIAGNOSIS — N2581 Secondary hyperparathyroidism of renal origin: Secondary | ICD-10-CM | POA: Diagnosis not present

## 2021-03-28 DIAGNOSIS — N186 End stage renal disease: Secondary | ICD-10-CM | POA: Diagnosis not present

## 2021-03-28 DIAGNOSIS — D509 Iron deficiency anemia, unspecified: Secondary | ICD-10-CM | POA: Diagnosis not present

## 2021-03-28 DIAGNOSIS — D689 Coagulation defect, unspecified: Secondary | ICD-10-CM | POA: Diagnosis not present

## 2021-03-28 DIAGNOSIS — Z992 Dependence on renal dialysis: Secondary | ICD-10-CM | POA: Diagnosis not present

## 2021-03-30 DIAGNOSIS — D509 Iron deficiency anemia, unspecified: Secondary | ICD-10-CM | POA: Diagnosis not present

## 2021-03-30 DIAGNOSIS — Z992 Dependence on renal dialysis: Secondary | ICD-10-CM | POA: Diagnosis not present

## 2021-03-30 DIAGNOSIS — D631 Anemia in chronic kidney disease: Secondary | ICD-10-CM | POA: Diagnosis not present

## 2021-03-30 DIAGNOSIS — D689 Coagulation defect, unspecified: Secondary | ICD-10-CM | POA: Diagnosis not present

## 2021-03-30 DIAGNOSIS — N186 End stage renal disease: Secondary | ICD-10-CM | POA: Diagnosis not present

## 2021-03-30 DIAGNOSIS — N2581 Secondary hyperparathyroidism of renal origin: Secondary | ICD-10-CM | POA: Diagnosis not present

## 2021-04-02 DIAGNOSIS — D689 Coagulation defect, unspecified: Secondary | ICD-10-CM | POA: Diagnosis not present

## 2021-04-02 DIAGNOSIS — Z992 Dependence on renal dialysis: Secondary | ICD-10-CM | POA: Diagnosis not present

## 2021-04-02 DIAGNOSIS — N186 End stage renal disease: Secondary | ICD-10-CM | POA: Diagnosis not present

## 2021-04-02 DIAGNOSIS — N2581 Secondary hyperparathyroidism of renal origin: Secondary | ICD-10-CM | POA: Diagnosis not present

## 2021-04-02 DIAGNOSIS — D509 Iron deficiency anemia, unspecified: Secondary | ICD-10-CM | POA: Diagnosis not present

## 2021-04-04 DIAGNOSIS — N186 End stage renal disease: Secondary | ICD-10-CM | POA: Diagnosis not present

## 2021-04-04 DIAGNOSIS — N2581 Secondary hyperparathyroidism of renal origin: Secondary | ICD-10-CM | POA: Diagnosis not present

## 2021-04-04 DIAGNOSIS — D689 Coagulation defect, unspecified: Secondary | ICD-10-CM | POA: Diagnosis not present

## 2021-04-04 DIAGNOSIS — Z992 Dependence on renal dialysis: Secondary | ICD-10-CM | POA: Diagnosis not present

## 2021-04-04 DIAGNOSIS — D509 Iron deficiency anemia, unspecified: Secondary | ICD-10-CM | POA: Diagnosis not present

## 2021-04-06 DIAGNOSIS — D689 Coagulation defect, unspecified: Secondary | ICD-10-CM | POA: Diagnosis not present

## 2021-04-06 DIAGNOSIS — N2581 Secondary hyperparathyroidism of renal origin: Secondary | ICD-10-CM | POA: Diagnosis not present

## 2021-04-06 DIAGNOSIS — N186 End stage renal disease: Secondary | ICD-10-CM | POA: Diagnosis not present

## 2021-04-06 DIAGNOSIS — Z992 Dependence on renal dialysis: Secondary | ICD-10-CM | POA: Diagnosis not present

## 2021-04-06 DIAGNOSIS — D509 Iron deficiency anemia, unspecified: Secondary | ICD-10-CM | POA: Diagnosis not present

## 2021-04-09 DIAGNOSIS — D509 Iron deficiency anemia, unspecified: Secondary | ICD-10-CM | POA: Diagnosis not present

## 2021-04-09 DIAGNOSIS — N2581 Secondary hyperparathyroidism of renal origin: Secondary | ICD-10-CM | POA: Diagnosis not present

## 2021-04-09 DIAGNOSIS — D631 Anemia in chronic kidney disease: Secondary | ICD-10-CM | POA: Diagnosis not present

## 2021-04-09 DIAGNOSIS — N186 End stage renal disease: Secondary | ICD-10-CM | POA: Diagnosis not present

## 2021-04-09 DIAGNOSIS — D689 Coagulation defect, unspecified: Secondary | ICD-10-CM | POA: Diagnosis not present

## 2021-04-09 DIAGNOSIS — Z992 Dependence on renal dialysis: Secondary | ICD-10-CM | POA: Diagnosis not present

## 2021-04-10 ENCOUNTER — Other Ambulatory Visit: Payer: Self-pay

## 2021-04-10 ENCOUNTER — Ambulatory Visit: Payer: Medicare HMO | Admitting: Podiatry

## 2021-04-10 ENCOUNTER — Encounter: Payer: Self-pay | Admitting: Podiatry

## 2021-04-10 DIAGNOSIS — T8249XA Other complication of vascular dialysis catheter, initial encounter: Secondary | ICD-10-CM | POA: Insufficient documentation

## 2021-04-10 DIAGNOSIS — F1010 Alcohol abuse, uncomplicated: Secondary | ICD-10-CM | POA: Insufficient documentation

## 2021-04-10 DIAGNOSIS — M79675 Pain in left toe(s): Secondary | ICD-10-CM | POA: Diagnosis not present

## 2021-04-10 DIAGNOSIS — M2012 Hallux valgus (acquired), left foot: Secondary | ICD-10-CM

## 2021-04-10 DIAGNOSIS — B351 Tinea unguium: Secondary | ICD-10-CM

## 2021-04-10 DIAGNOSIS — M79674 Pain in right toe(s): Secondary | ICD-10-CM | POA: Diagnosis not present

## 2021-04-10 DIAGNOSIS — M2011 Hallux valgus (acquired), right foot: Secondary | ICD-10-CM | POA: Diagnosis not present

## 2021-04-10 DIAGNOSIS — I609 Nontraumatic subarachnoid hemorrhage, unspecified: Secondary | ICD-10-CM | POA: Insufficient documentation

## 2021-04-10 DIAGNOSIS — Z9889 Other specified postprocedural states: Secondary | ICD-10-CM | POA: Insufficient documentation

## 2021-04-11 DIAGNOSIS — N2581 Secondary hyperparathyroidism of renal origin: Secondary | ICD-10-CM | POA: Diagnosis not present

## 2021-04-11 DIAGNOSIS — Z992 Dependence on renal dialysis: Secondary | ICD-10-CM | POA: Diagnosis not present

## 2021-04-11 DIAGNOSIS — D631 Anemia in chronic kidney disease: Secondary | ICD-10-CM | POA: Diagnosis not present

## 2021-04-11 DIAGNOSIS — D509 Iron deficiency anemia, unspecified: Secondary | ICD-10-CM | POA: Diagnosis not present

## 2021-04-11 DIAGNOSIS — D689 Coagulation defect, unspecified: Secondary | ICD-10-CM | POA: Diagnosis not present

## 2021-04-11 DIAGNOSIS — N186 End stage renal disease: Secondary | ICD-10-CM | POA: Diagnosis not present

## 2021-04-13 DIAGNOSIS — Z992 Dependence on renal dialysis: Secondary | ICD-10-CM | POA: Diagnosis not present

## 2021-04-13 DIAGNOSIS — N2581 Secondary hyperparathyroidism of renal origin: Secondary | ICD-10-CM | POA: Diagnosis not present

## 2021-04-13 DIAGNOSIS — D631 Anemia in chronic kidney disease: Secondary | ICD-10-CM | POA: Diagnosis not present

## 2021-04-13 DIAGNOSIS — N186 End stage renal disease: Secondary | ICD-10-CM | POA: Diagnosis not present

## 2021-04-13 DIAGNOSIS — D509 Iron deficiency anemia, unspecified: Secondary | ICD-10-CM | POA: Diagnosis not present

## 2021-04-13 DIAGNOSIS — D689 Coagulation defect, unspecified: Secondary | ICD-10-CM | POA: Diagnosis not present

## 2021-04-14 NOTE — Progress Notes (Signed)
Subjective: [name] Downs presents today referred by [name], MD for complaint of painful thick toenails that are difficult to trim. Duration greater than several months. Pain interferes with ambulation. He has attempted treatment, but cannot cut them. Aggravating factors include wearing enclosed shoe gear. Pain is relieved with periodic professional debridement.[name]   PCP is Dr. [name] and last visit was 11/15/2020.  Past Medical History:  Diagnosis Date   Aortic dissection ([place])    type B   Brain aneurysm    Chronic kidney disease    Deafness in left ear    Gout    Hypertension    Medically noncompliant    Sleep apnea     does not wears CPAP   Wears glasses      Patient Active Problem List   Diagnosis Date Noted   Alcohol abuse 04/10/2021   Clotted dialysis access ([place]) 04/10/2021   S/P diskectomy 04/10/2021   SAH (subarachnoid hemorrhage) ([place]) 04/10/2021   Coagulation defect, unspecified ([place]) 03/12/2021   Allergy, unspecified, initial encounter 03/05/2021   Anaphylactic shock, unspecified, initial encounter 03/05/2021   Anemia in chronic kidney disease 03/05/2021   A-V fistula ([place]) 03/05/2021   Encounter for general adult medical examination without abnormal findings 03/05/2021   Hematuria, unspecified 03/05/2021   Hyperkalemia 03/05/2021   Hyperlipidemia, unspecified 03/05/2021   Iron deficiency anemia, unspecified 03/05/2021   Male erectile dysfunction, unspecified 03/05/2021   Nicotine dependence, unspecified, uncomplicated [id number]   Pain, unspecified 03/05/2021   Proteinuria, unspecified 03/05/2021   Unspecified osteoarthritis, unspecified site 03/05/2021   BMI 32.0-32.9,adult 05/30/2020   OSA (obstructive sleep apnea) 05/30/2020   Brain aneurysm 05/30/2020   COVID-19 05/30/2020   Kidney disease 08/20/2018   Deafness in left ear 10/08/2017   Gout 10/08/2017   Secondary hyperparathyroidism ([place]) 10/08/2017   Tobacco abuse 01/17/2014   CKD (chronic  kidney disease) stage 4, GFR 15-29 ml/min ([place]) 12/27/2013   Aortic dissection (HCC)    Hypertension    Medically noncompliant      Past Surgical History:  Procedure Laterality Date   AV FISTULA PLACEMENT Right 12/17/2015   Procedure: CREATION OF RADIOCEPHALIC  ARTERIOVENOUS (AV) FISTULA RIGHT LOWER  ARM;  Surgeon: Elam Dutch, MD;  Location: [facility] OR;  Service: Vascular;  Laterality: Right;   AV FISTULA PLACEMENT Right 12/17/2015   Procedure: CREATION OF RIGHT UPPER ARM  BRACHIO CEPHALIC ARTERIOVENOUS (AV) FISTULA;  Surgeon: Elam Dutch, MD;  Location: [facility] OR;  Service: Vascular;  Laterality: Right;   AV FISTULA PLACEMENT Left 09/08/2017   Procedure: ARTERIOVENOUS (AV) FISTULA CREATION LEFT BRACHIOCEPHALIC;  Surgeon: Elam Dutch, MD;  Location: [facility] OR;  Service: Vascular;  Laterality: Left;   AV FISTULA PLACEMENT Left 11/23/2019   Procedure: LEFT ARM BASILIC VEIN (AV) TRANSPOSITION;  Surgeon: Rosetta Posner, MD;  Location: [facility] OR;  Service: Vascular;  Laterality: Left;   BACK SURGERY       Current Outpatient Medications on File Prior to Visit  Medication Sig Dispense Refill   allopurinol (ZYLOPRIM) 100 MG tablet Take 100 mg by mouth in the morning, at noon, in the evening, and at bedtime.      amLODipine (NORVASC) 10 MG tablet Take 10 mg by mouth at bedtime.      calcitRIOL (ROCALTROL) 0.25 MCG capsule Take 0.25 mcg by mouth daily.      dextromethorphan-guaiFENesin (MUCINEX DM) 30-600 MG 12hr tablet Take 1 tablet by mouth 2 (two) times daily. 30 tablet 0   esomeprazole (NEXIUM) 40  MG capsule Take 1 capsule (40 mg total) by mouth daily. 14 capsule 0   fluticasone (FLONASE) 50 MCG/ACT nasal spray Place 2 sprays into both nostrils daily as needed for allergies.   1   furosemide (LASIX) 20 MG tablet Take 20 mg by mouth daily.      hydrALAZINE (APRESOLINE) 25 MG tablet Take 25 mg by mouth in the morning and at bedtime.      loratadine (CLARITIN) 10 MG tablet Take 10 mg by mouth daily.      No current facility-administered medications on file prior to visit.     No Known Allergies   Social History   Occupational History   Not on file  Tobacco Use   Smoking status: Some Days    Packs/day: 0.25    Years: 20.00    Pack years: 5.00    Types: Cigarettes   Smokeless tobacco: Former    Types: Nurse, children's Use: Never used  Substance and Sexual Activity   Alcohol use: No    Alcohol/week: 0.0 standard drinks   Drug use: No   Sexual activity: Yes     Family History  Problem Relation Age of Onset   Alzheimer's disease Mother    Cirrhosis Father    Heart attack Brother       There is no immunization history on file for this patient.   Objective: Anothony Downs is a pleasant 55 y.o. male WD, WN in NAD. AAO x 3.  There were no vitals filed for this visit.  Vascular Examination:  Capillary refill time to digits immediate b/l. Palpable DP pulse(s) b/l lower extremities Palpable PT pulse(s) b/l lower extremities Pedal hair sparse. Lower extremity skin temperature gradient within normal limits. No pain with calf compression b/l. No edema noted b/l lower extremities.  Dermatological Examination: Skin warm and supple b/l lower extremities. No open wounds b/l lower extremities. No interdigital macerations b/l lower extremities. Toenails 1-5 b/l elongated, discolored, dystrophic, thickened, crumbly with subungual debris and tenderness to dorsal palpation.  Musculoskeletal: Normal muscle strength 5/5 to all lower extremity muscle groups bilaterally. Hallux valgus with bunion deformity noted b/l lower extremities.  Neurological: Protective sensation intact 5/5 intact bilaterally with 10g monofilament b/l. Vibratory sensation intact b/l.  Assessment: 1. Pain due to onychomycosis of toenails of both feet   2. Hallux valgus, acquired, bilateral      Plan: -Examined patient. -Patient to continue soft, supportive shoe gear daily. -Discussed topical, laser  and oral medication for onychomycosis. Patient opted for toenail debridement only on today.  -Toenails 1-5 b/l were debrided in length and girth with sterile nail nippers and dremel without iatrogenic bleeding.  -Patient to report any pedal injuries to medical professional immediately. -Patient/POA to call should there be question/concern in the interim.  Return in about 3 months (around 07/11/2021).  Marzetta Board, DPM

## 2021-04-16 DIAGNOSIS — D631 Anemia in chronic kidney disease: Secondary | ICD-10-CM | POA: Diagnosis not present

## 2021-04-16 DIAGNOSIS — Z992 Dependence on renal dialysis: Secondary | ICD-10-CM | POA: Diagnosis not present

## 2021-04-16 DIAGNOSIS — N2581 Secondary hyperparathyroidism of renal origin: Secondary | ICD-10-CM | POA: Diagnosis not present

## 2021-04-16 DIAGNOSIS — D689 Coagulation defect, unspecified: Secondary | ICD-10-CM | POA: Diagnosis not present

## 2021-04-16 DIAGNOSIS — N186 End stage renal disease: Secondary | ICD-10-CM | POA: Diagnosis not present

## 2021-04-16 DIAGNOSIS — D509 Iron deficiency anemia, unspecified: Secondary | ICD-10-CM | POA: Diagnosis not present

## 2021-04-17 DIAGNOSIS — N186 End stage renal disease: Secondary | ICD-10-CM | POA: Diagnosis not present

## 2021-04-17 DIAGNOSIS — I120 Hypertensive chronic kidney disease with stage 5 chronic kidney disease or end stage renal disease: Secondary | ICD-10-CM | POA: Diagnosis not present

## 2021-04-17 DIAGNOSIS — Z992 Dependence on renal dialysis: Secondary | ICD-10-CM | POA: Diagnosis not present

## 2021-04-17 DIAGNOSIS — E785 Hyperlipidemia, unspecified: Secondary | ICD-10-CM | POA: Diagnosis not present

## 2021-04-17 DIAGNOSIS — Z720 Tobacco use: Secondary | ICD-10-CM | POA: Diagnosis not present

## 2021-04-17 DIAGNOSIS — I129 Hypertensive chronic kidney disease with stage 1 through stage 4 chronic kidney disease, or unspecified chronic kidney disease: Secondary | ICD-10-CM | POA: Diagnosis not present

## 2021-04-17 DIAGNOSIS — N185 Chronic kidney disease, stage 5: Secondary | ICD-10-CM | POA: Diagnosis not present

## 2021-04-18 DIAGNOSIS — N186 End stage renal disease: Secondary | ICD-10-CM | POA: Diagnosis not present

## 2021-04-18 DIAGNOSIS — N2581 Secondary hyperparathyroidism of renal origin: Secondary | ICD-10-CM | POA: Diagnosis not present

## 2021-04-18 DIAGNOSIS — Z992 Dependence on renal dialysis: Secondary | ICD-10-CM | POA: Diagnosis not present

## 2021-04-18 DIAGNOSIS — D631 Anemia in chronic kidney disease: Secondary | ICD-10-CM | POA: Diagnosis not present

## 2021-04-18 DIAGNOSIS — D689 Coagulation defect, unspecified: Secondary | ICD-10-CM | POA: Diagnosis not present

## 2021-04-20 DIAGNOSIS — N2581 Secondary hyperparathyroidism of renal origin: Secondary | ICD-10-CM | POA: Diagnosis not present

## 2021-04-20 DIAGNOSIS — D689 Coagulation defect, unspecified: Secondary | ICD-10-CM | POA: Diagnosis not present

## 2021-04-20 DIAGNOSIS — D631 Anemia in chronic kidney disease: Secondary | ICD-10-CM | POA: Diagnosis not present

## 2021-04-20 DIAGNOSIS — Z992 Dependence on renal dialysis: Secondary | ICD-10-CM | POA: Diagnosis not present

## 2021-04-20 DIAGNOSIS — N186 End stage renal disease: Secondary | ICD-10-CM | POA: Diagnosis not present

## 2021-04-23 DIAGNOSIS — D689 Coagulation defect, unspecified: Secondary | ICD-10-CM | POA: Diagnosis not present

## 2021-04-23 DIAGNOSIS — N186 End stage renal disease: Secondary | ICD-10-CM | POA: Diagnosis not present

## 2021-04-23 DIAGNOSIS — D509 Iron deficiency anemia, unspecified: Secondary | ICD-10-CM | POA: Diagnosis not present

## 2021-04-23 DIAGNOSIS — D631 Anemia in chronic kidney disease: Secondary | ICD-10-CM | POA: Diagnosis not present

## 2021-04-23 DIAGNOSIS — N2581 Secondary hyperparathyroidism of renal origin: Secondary | ICD-10-CM | POA: Diagnosis not present

## 2021-04-23 DIAGNOSIS — Z992 Dependence on renal dialysis: Secondary | ICD-10-CM | POA: Diagnosis not present

## 2021-04-25 DIAGNOSIS — Z992 Dependence on renal dialysis: Secondary | ICD-10-CM | POA: Diagnosis not present

## 2021-04-25 DIAGNOSIS — D509 Iron deficiency anemia, unspecified: Secondary | ICD-10-CM | POA: Diagnosis not present

## 2021-04-25 DIAGNOSIS — N2581 Secondary hyperparathyroidism of renal origin: Secondary | ICD-10-CM | POA: Diagnosis not present

## 2021-04-25 DIAGNOSIS — D689 Coagulation defect, unspecified: Secondary | ICD-10-CM | POA: Diagnosis not present

## 2021-04-25 DIAGNOSIS — N186 End stage renal disease: Secondary | ICD-10-CM | POA: Diagnosis not present

## 2021-04-25 DIAGNOSIS — D631 Anemia in chronic kidney disease: Secondary | ICD-10-CM | POA: Diagnosis not present

## 2021-04-27 DIAGNOSIS — D631 Anemia in chronic kidney disease: Secondary | ICD-10-CM | POA: Diagnosis not present

## 2021-04-27 DIAGNOSIS — D509 Iron deficiency anemia, unspecified: Secondary | ICD-10-CM | POA: Diagnosis not present

## 2021-04-27 DIAGNOSIS — D689 Coagulation defect, unspecified: Secondary | ICD-10-CM | POA: Diagnosis not present

## 2021-04-27 DIAGNOSIS — N186 End stage renal disease: Secondary | ICD-10-CM | POA: Diagnosis not present

## 2021-04-27 DIAGNOSIS — N2581 Secondary hyperparathyroidism of renal origin: Secondary | ICD-10-CM | POA: Diagnosis not present

## 2021-04-27 DIAGNOSIS — Z992 Dependence on renal dialysis: Secondary | ICD-10-CM | POA: Diagnosis not present

## 2021-04-29 DIAGNOSIS — L299 Pruritus, unspecified: Secondary | ICD-10-CM | POA: Insufficient documentation

## 2021-04-30 DIAGNOSIS — Z992 Dependence on renal dialysis: Secondary | ICD-10-CM | POA: Diagnosis not present

## 2021-04-30 DIAGNOSIS — D689 Coagulation defect, unspecified: Secondary | ICD-10-CM | POA: Diagnosis not present

## 2021-04-30 DIAGNOSIS — D509 Iron deficiency anemia, unspecified: Secondary | ICD-10-CM | POA: Diagnosis not present

## 2021-04-30 DIAGNOSIS — N186 End stage renal disease: Secondary | ICD-10-CM | POA: Diagnosis not present

## 2021-04-30 DIAGNOSIS — N2581 Secondary hyperparathyroidism of renal origin: Secondary | ICD-10-CM | POA: Diagnosis not present

## 2021-04-30 DIAGNOSIS — D631 Anemia in chronic kidney disease: Secondary | ICD-10-CM | POA: Diagnosis not present

## 2021-05-02 DIAGNOSIS — N2581 Secondary hyperparathyroidism of renal origin: Secondary | ICD-10-CM | POA: Diagnosis not present

## 2021-05-02 DIAGNOSIS — D689 Coagulation defect, unspecified: Secondary | ICD-10-CM | POA: Diagnosis not present

## 2021-05-02 DIAGNOSIS — Z992 Dependence on renal dialysis: Secondary | ICD-10-CM | POA: Diagnosis not present

## 2021-05-02 DIAGNOSIS — D509 Iron deficiency anemia, unspecified: Secondary | ICD-10-CM | POA: Diagnosis not present

## 2021-05-02 DIAGNOSIS — D631 Anemia in chronic kidney disease: Secondary | ICD-10-CM | POA: Diagnosis not present

## 2021-05-02 DIAGNOSIS — N186 End stage renal disease: Secondary | ICD-10-CM | POA: Diagnosis not present

## 2021-05-04 DIAGNOSIS — Z992 Dependence on renal dialysis: Secondary | ICD-10-CM | POA: Diagnosis not present

## 2021-05-04 DIAGNOSIS — D509 Iron deficiency anemia, unspecified: Secondary | ICD-10-CM | POA: Diagnosis not present

## 2021-05-04 DIAGNOSIS — N186 End stage renal disease: Secondary | ICD-10-CM | POA: Diagnosis not present

## 2021-05-04 DIAGNOSIS — D689 Coagulation defect, unspecified: Secondary | ICD-10-CM | POA: Diagnosis not present

## 2021-05-04 DIAGNOSIS — N2581 Secondary hyperparathyroidism of renal origin: Secondary | ICD-10-CM | POA: Diagnosis not present

## 2021-05-04 DIAGNOSIS — D631 Anemia in chronic kidney disease: Secondary | ICD-10-CM | POA: Diagnosis not present

## 2021-05-07 DIAGNOSIS — D509 Iron deficiency anemia, unspecified: Secondary | ICD-10-CM | POA: Diagnosis not present

## 2021-05-07 DIAGNOSIS — D689 Coagulation defect, unspecified: Secondary | ICD-10-CM | POA: Diagnosis not present

## 2021-05-07 DIAGNOSIS — N2581 Secondary hyperparathyroidism of renal origin: Secondary | ICD-10-CM | POA: Diagnosis not present

## 2021-05-07 DIAGNOSIS — Z992 Dependence on renal dialysis: Secondary | ICD-10-CM | POA: Diagnosis not present

## 2021-05-07 DIAGNOSIS — D631 Anemia in chronic kidney disease: Secondary | ICD-10-CM | POA: Diagnosis not present

## 2021-05-07 DIAGNOSIS — N186 End stage renal disease: Secondary | ICD-10-CM | POA: Diagnosis not present

## 2021-05-09 DIAGNOSIS — N186 End stage renal disease: Secondary | ICD-10-CM | POA: Diagnosis not present

## 2021-05-09 DIAGNOSIS — Z992 Dependence on renal dialysis: Secondary | ICD-10-CM | POA: Diagnosis not present

## 2021-05-09 DIAGNOSIS — D689 Coagulation defect, unspecified: Secondary | ICD-10-CM | POA: Diagnosis not present

## 2021-05-09 DIAGNOSIS — D631 Anemia in chronic kidney disease: Secondary | ICD-10-CM | POA: Diagnosis not present

## 2021-05-09 DIAGNOSIS — D509 Iron deficiency anemia, unspecified: Secondary | ICD-10-CM | POA: Diagnosis not present

## 2021-05-09 DIAGNOSIS — N2581 Secondary hyperparathyroidism of renal origin: Secondary | ICD-10-CM | POA: Diagnosis not present

## 2021-05-11 DIAGNOSIS — N2581 Secondary hyperparathyroidism of renal origin: Secondary | ICD-10-CM | POA: Diagnosis not present

## 2021-05-11 DIAGNOSIS — D509 Iron deficiency anemia, unspecified: Secondary | ICD-10-CM | POA: Diagnosis not present

## 2021-05-11 DIAGNOSIS — N186 End stage renal disease: Secondary | ICD-10-CM | POA: Diagnosis not present

## 2021-05-11 DIAGNOSIS — D631 Anemia in chronic kidney disease: Secondary | ICD-10-CM | POA: Diagnosis not present

## 2021-05-11 DIAGNOSIS — D689 Coagulation defect, unspecified: Secondary | ICD-10-CM | POA: Diagnosis not present

## 2021-05-11 DIAGNOSIS — Z992 Dependence on renal dialysis: Secondary | ICD-10-CM | POA: Diagnosis not present

## 2021-05-14 DIAGNOSIS — D631 Anemia in chronic kidney disease: Secondary | ICD-10-CM | POA: Diagnosis not present

## 2021-05-14 DIAGNOSIS — N2581 Secondary hyperparathyroidism of renal origin: Secondary | ICD-10-CM | POA: Diagnosis not present

## 2021-05-14 DIAGNOSIS — Z992 Dependence on renal dialysis: Secondary | ICD-10-CM | POA: Diagnosis not present

## 2021-05-14 DIAGNOSIS — N186 End stage renal disease: Secondary | ICD-10-CM | POA: Diagnosis not present

## 2021-05-14 DIAGNOSIS — D509 Iron deficiency anemia, unspecified: Secondary | ICD-10-CM | POA: Diagnosis not present

## 2021-05-14 DIAGNOSIS — D689 Coagulation defect, unspecified: Secondary | ICD-10-CM | POA: Diagnosis not present

## 2021-05-16 DIAGNOSIS — D689 Coagulation defect, unspecified: Secondary | ICD-10-CM | POA: Diagnosis not present

## 2021-05-16 DIAGNOSIS — D509 Iron deficiency anemia, unspecified: Secondary | ICD-10-CM | POA: Diagnosis not present

## 2021-05-16 DIAGNOSIS — D631 Anemia in chronic kidney disease: Secondary | ICD-10-CM | POA: Diagnosis not present

## 2021-05-16 DIAGNOSIS — Z992 Dependence on renal dialysis: Secondary | ICD-10-CM | POA: Diagnosis not present

## 2021-05-16 DIAGNOSIS — N2581 Secondary hyperparathyroidism of renal origin: Secondary | ICD-10-CM | POA: Diagnosis not present

## 2021-05-16 DIAGNOSIS — N186 End stage renal disease: Secondary | ICD-10-CM | POA: Diagnosis not present

## 2021-05-17 DIAGNOSIS — E785 Hyperlipidemia, unspecified: Secondary | ICD-10-CM | POA: Diagnosis not present

## 2021-05-17 DIAGNOSIS — Z720 Tobacco use: Secondary | ICD-10-CM | POA: Diagnosis not present

## 2021-05-17 DIAGNOSIS — Z992 Dependence on renal dialysis: Secondary | ICD-10-CM | POA: Diagnosis not present

## 2021-05-17 DIAGNOSIS — N186 End stage renal disease: Secondary | ICD-10-CM | POA: Diagnosis not present

## 2021-05-17 DIAGNOSIS — I129 Hypertensive chronic kidney disease with stage 1 through stage 4 chronic kidney disease, or unspecified chronic kidney disease: Secondary | ICD-10-CM | POA: Diagnosis not present

## 2021-05-17 DIAGNOSIS — N185 Chronic kidney disease, stage 5: Secondary | ICD-10-CM | POA: Diagnosis not present

## 2021-05-17 DIAGNOSIS — I120 Hypertensive chronic kidney disease with stage 5 chronic kidney disease or end stage renal disease: Secondary | ICD-10-CM | POA: Diagnosis not present

## 2021-05-18 DIAGNOSIS — Z992 Dependence on renal dialysis: Secondary | ICD-10-CM | POA: Diagnosis not present

## 2021-05-18 DIAGNOSIS — D689 Coagulation defect, unspecified: Secondary | ICD-10-CM | POA: Diagnosis not present

## 2021-05-18 DIAGNOSIS — N186 End stage renal disease: Secondary | ICD-10-CM | POA: Diagnosis not present

## 2021-05-18 DIAGNOSIS — N2581 Secondary hyperparathyroidism of renal origin: Secondary | ICD-10-CM | POA: Diagnosis not present

## 2021-05-21 DIAGNOSIS — D631 Anemia in chronic kidney disease: Secondary | ICD-10-CM | POA: Diagnosis not present

## 2021-05-21 DIAGNOSIS — N2581 Secondary hyperparathyroidism of renal origin: Secondary | ICD-10-CM | POA: Diagnosis not present

## 2021-05-21 DIAGNOSIS — N186 End stage renal disease: Secondary | ICD-10-CM | POA: Diagnosis not present

## 2021-05-21 DIAGNOSIS — Z992 Dependence on renal dialysis: Secondary | ICD-10-CM | POA: Diagnosis not present

## 2021-05-21 DIAGNOSIS — Z23 Encounter for immunization: Secondary | ICD-10-CM | POA: Diagnosis not present

## 2021-05-21 DIAGNOSIS — D509 Iron deficiency anemia, unspecified: Secondary | ICD-10-CM | POA: Diagnosis not present

## 2021-05-21 DIAGNOSIS — D689 Coagulation defect, unspecified: Secondary | ICD-10-CM | POA: Diagnosis not present

## 2021-05-23 DIAGNOSIS — D689 Coagulation defect, unspecified: Secondary | ICD-10-CM | POA: Diagnosis not present

## 2021-05-23 DIAGNOSIS — D509 Iron deficiency anemia, unspecified: Secondary | ICD-10-CM | POA: Diagnosis not present

## 2021-05-23 DIAGNOSIS — D631 Anemia in chronic kidney disease: Secondary | ICD-10-CM | POA: Diagnosis not present

## 2021-05-23 DIAGNOSIS — N2581 Secondary hyperparathyroidism of renal origin: Secondary | ICD-10-CM | POA: Diagnosis not present

## 2021-05-23 DIAGNOSIS — Z992 Dependence on renal dialysis: Secondary | ICD-10-CM | POA: Diagnosis not present

## 2021-05-23 DIAGNOSIS — N186 End stage renal disease: Secondary | ICD-10-CM | POA: Diagnosis not present

## 2021-05-23 DIAGNOSIS — Z23 Encounter for immunization: Secondary | ICD-10-CM | POA: Diagnosis not present

## 2021-05-25 DIAGNOSIS — N186 End stage renal disease: Secondary | ICD-10-CM | POA: Diagnosis not present

## 2021-05-25 DIAGNOSIS — Z992 Dependence on renal dialysis: Secondary | ICD-10-CM | POA: Diagnosis not present

## 2021-05-25 DIAGNOSIS — D631 Anemia in chronic kidney disease: Secondary | ICD-10-CM | POA: Diagnosis not present

## 2021-05-25 DIAGNOSIS — N2581 Secondary hyperparathyroidism of renal origin: Secondary | ICD-10-CM | POA: Diagnosis not present

## 2021-05-25 DIAGNOSIS — D509 Iron deficiency anemia, unspecified: Secondary | ICD-10-CM | POA: Diagnosis not present

## 2021-05-25 DIAGNOSIS — D689 Coagulation defect, unspecified: Secondary | ICD-10-CM | POA: Diagnosis not present

## 2021-05-25 DIAGNOSIS — Z23 Encounter for immunization: Secondary | ICD-10-CM | POA: Diagnosis not present

## 2021-05-27 DIAGNOSIS — N186 End stage renal disease: Secondary | ICD-10-CM | POA: Diagnosis not present

## 2021-05-27 DIAGNOSIS — I129 Hypertensive chronic kidney disease with stage 1 through stage 4 chronic kidney disease, or unspecified chronic kidney disease: Secondary | ICD-10-CM | POA: Diagnosis not present

## 2021-05-27 DIAGNOSIS — E785 Hyperlipidemia, unspecified: Secondary | ICD-10-CM | POA: Diagnosis not present

## 2021-05-27 DIAGNOSIS — D631 Anemia in chronic kidney disease: Secondary | ICD-10-CM | POA: Diagnosis not present

## 2021-05-27 DIAGNOSIS — Z992 Dependence on renal dialysis: Secondary | ICD-10-CM | POA: Diagnosis not present

## 2021-05-28 DIAGNOSIS — N2581 Secondary hyperparathyroidism of renal origin: Secondary | ICD-10-CM | POA: Diagnosis not present

## 2021-05-28 DIAGNOSIS — N186 End stage renal disease: Secondary | ICD-10-CM | POA: Diagnosis not present

## 2021-05-28 DIAGNOSIS — Z23 Encounter for immunization: Secondary | ICD-10-CM | POA: Diagnosis not present

## 2021-05-28 DIAGNOSIS — D689 Coagulation defect, unspecified: Secondary | ICD-10-CM | POA: Diagnosis not present

## 2021-05-28 DIAGNOSIS — D631 Anemia in chronic kidney disease: Secondary | ICD-10-CM | POA: Diagnosis not present

## 2021-05-28 DIAGNOSIS — D509 Iron deficiency anemia, unspecified: Secondary | ICD-10-CM | POA: Diagnosis not present

## 2021-05-28 DIAGNOSIS — Z992 Dependence on renal dialysis: Secondary | ICD-10-CM | POA: Diagnosis not present

## 2021-05-30 DIAGNOSIS — Z23 Encounter for immunization: Secondary | ICD-10-CM | POA: Diagnosis not present

## 2021-05-30 DIAGNOSIS — D631 Anemia in chronic kidney disease: Secondary | ICD-10-CM | POA: Diagnosis not present

## 2021-05-30 DIAGNOSIS — N2581 Secondary hyperparathyroidism of renal origin: Secondary | ICD-10-CM | POA: Diagnosis not present

## 2021-05-30 DIAGNOSIS — Z992 Dependence on renal dialysis: Secondary | ICD-10-CM | POA: Diagnosis not present

## 2021-05-30 DIAGNOSIS — N186 End stage renal disease: Secondary | ICD-10-CM | POA: Diagnosis not present

## 2021-05-30 DIAGNOSIS — D509 Iron deficiency anemia, unspecified: Secondary | ICD-10-CM | POA: Diagnosis not present

## 2021-05-30 DIAGNOSIS — D689 Coagulation defect, unspecified: Secondary | ICD-10-CM | POA: Diagnosis not present

## 2021-06-01 DIAGNOSIS — Z992 Dependence on renal dialysis: Secondary | ICD-10-CM | POA: Diagnosis not present

## 2021-06-01 DIAGNOSIS — Z23 Encounter for immunization: Secondary | ICD-10-CM | POA: Diagnosis not present

## 2021-06-01 DIAGNOSIS — D509 Iron deficiency anemia, unspecified: Secondary | ICD-10-CM | POA: Diagnosis not present

## 2021-06-01 DIAGNOSIS — N186 End stage renal disease: Secondary | ICD-10-CM | POA: Diagnosis not present

## 2021-06-01 DIAGNOSIS — N2581 Secondary hyperparathyroidism of renal origin: Secondary | ICD-10-CM | POA: Diagnosis not present

## 2021-06-01 DIAGNOSIS — D689 Coagulation defect, unspecified: Secondary | ICD-10-CM | POA: Diagnosis not present

## 2021-06-01 DIAGNOSIS — D631 Anemia in chronic kidney disease: Secondary | ICD-10-CM | POA: Diagnosis not present

## 2021-06-04 DIAGNOSIS — Z992 Dependence on renal dialysis: Secondary | ICD-10-CM | POA: Diagnosis not present

## 2021-06-04 DIAGNOSIS — N186 End stage renal disease: Secondary | ICD-10-CM | POA: Diagnosis not present

## 2021-06-04 DIAGNOSIS — N2581 Secondary hyperparathyroidism of renal origin: Secondary | ICD-10-CM | POA: Diagnosis not present

## 2021-06-04 DIAGNOSIS — D631 Anemia in chronic kidney disease: Secondary | ICD-10-CM | POA: Diagnosis not present

## 2021-06-04 DIAGNOSIS — D689 Coagulation defect, unspecified: Secondary | ICD-10-CM | POA: Diagnosis not present

## 2021-06-04 DIAGNOSIS — D509 Iron deficiency anemia, unspecified: Secondary | ICD-10-CM | POA: Diagnosis not present

## 2021-06-06 DIAGNOSIS — D631 Anemia in chronic kidney disease: Secondary | ICD-10-CM | POA: Diagnosis not present

## 2021-06-06 DIAGNOSIS — Z992 Dependence on renal dialysis: Secondary | ICD-10-CM | POA: Diagnosis not present

## 2021-06-06 DIAGNOSIS — D509 Iron deficiency anemia, unspecified: Secondary | ICD-10-CM | POA: Diagnosis not present

## 2021-06-06 DIAGNOSIS — D689 Coagulation defect, unspecified: Secondary | ICD-10-CM | POA: Diagnosis not present

## 2021-06-06 DIAGNOSIS — N186 End stage renal disease: Secondary | ICD-10-CM | POA: Diagnosis not present

## 2021-06-06 DIAGNOSIS — N2581 Secondary hyperparathyroidism of renal origin: Secondary | ICD-10-CM | POA: Diagnosis not present

## 2021-06-08 DIAGNOSIS — N186 End stage renal disease: Secondary | ICD-10-CM | POA: Diagnosis not present

## 2021-06-08 DIAGNOSIS — N2581 Secondary hyperparathyroidism of renal origin: Secondary | ICD-10-CM | POA: Diagnosis not present

## 2021-06-08 DIAGNOSIS — D631 Anemia in chronic kidney disease: Secondary | ICD-10-CM | POA: Diagnosis not present

## 2021-06-08 DIAGNOSIS — D509 Iron deficiency anemia, unspecified: Secondary | ICD-10-CM | POA: Diagnosis not present

## 2021-06-08 DIAGNOSIS — D689 Coagulation defect, unspecified: Secondary | ICD-10-CM | POA: Diagnosis not present

## 2021-06-08 DIAGNOSIS — Z992 Dependence on renal dialysis: Secondary | ICD-10-CM | POA: Diagnosis not present

## 2021-06-11 DIAGNOSIS — N186 End stage renal disease: Secondary | ICD-10-CM | POA: Diagnosis not present

## 2021-06-11 DIAGNOSIS — D689 Coagulation defect, unspecified: Secondary | ICD-10-CM | POA: Diagnosis not present

## 2021-06-11 DIAGNOSIS — N2581 Secondary hyperparathyroidism of renal origin: Secondary | ICD-10-CM | POA: Diagnosis not present

## 2021-06-11 DIAGNOSIS — Z992 Dependence on renal dialysis: Secondary | ICD-10-CM | POA: Diagnosis not present

## 2021-06-11 DIAGNOSIS — D631 Anemia in chronic kidney disease: Secondary | ICD-10-CM | POA: Diagnosis not present

## 2021-06-11 DIAGNOSIS — D509 Iron deficiency anemia, unspecified: Secondary | ICD-10-CM | POA: Diagnosis not present

## 2021-06-13 DIAGNOSIS — Z992 Dependence on renal dialysis: Secondary | ICD-10-CM | POA: Diagnosis not present

## 2021-06-13 DIAGNOSIS — D689 Coagulation defect, unspecified: Secondary | ICD-10-CM | POA: Diagnosis not present

## 2021-06-13 DIAGNOSIS — N186 End stage renal disease: Secondary | ICD-10-CM | POA: Diagnosis not present

## 2021-06-13 DIAGNOSIS — D631 Anemia in chronic kidney disease: Secondary | ICD-10-CM | POA: Diagnosis not present

## 2021-06-13 DIAGNOSIS — D509 Iron deficiency anemia, unspecified: Secondary | ICD-10-CM | POA: Diagnosis not present

## 2021-06-13 DIAGNOSIS — N2581 Secondary hyperparathyroidism of renal origin: Secondary | ICD-10-CM | POA: Diagnosis not present

## 2021-06-15 DIAGNOSIS — D509 Iron deficiency anemia, unspecified: Secondary | ICD-10-CM | POA: Diagnosis not present

## 2021-06-15 DIAGNOSIS — D689 Coagulation defect, unspecified: Secondary | ICD-10-CM | POA: Diagnosis not present

## 2021-06-15 DIAGNOSIS — N186 End stage renal disease: Secondary | ICD-10-CM | POA: Diagnosis not present

## 2021-06-15 DIAGNOSIS — Z992 Dependence on renal dialysis: Secondary | ICD-10-CM | POA: Diagnosis not present

## 2021-06-15 DIAGNOSIS — N2581 Secondary hyperparathyroidism of renal origin: Secondary | ICD-10-CM | POA: Diagnosis not present

## 2021-06-15 DIAGNOSIS — D631 Anemia in chronic kidney disease: Secondary | ICD-10-CM | POA: Diagnosis not present

## 2021-06-17 DIAGNOSIS — Z720 Tobacco use: Secondary | ICD-10-CM | POA: Diagnosis not present

## 2021-06-17 DIAGNOSIS — E785 Hyperlipidemia, unspecified: Secondary | ICD-10-CM | POA: Diagnosis not present

## 2021-06-17 DIAGNOSIS — I129 Hypertensive chronic kidney disease with stage 1 through stage 4 chronic kidney disease, or unspecified chronic kidney disease: Secondary | ICD-10-CM | POA: Diagnosis not present

## 2021-06-17 DIAGNOSIS — N186 End stage renal disease: Secondary | ICD-10-CM | POA: Diagnosis not present

## 2021-06-17 DIAGNOSIS — Z992 Dependence on renal dialysis: Secondary | ICD-10-CM | POA: Diagnosis not present

## 2021-06-17 DIAGNOSIS — I120 Hypertensive chronic kidney disease with stage 5 chronic kidney disease or end stage renal disease: Secondary | ICD-10-CM | POA: Diagnosis not present

## 2021-06-17 DIAGNOSIS — N185 Chronic kidney disease, stage 5: Secondary | ICD-10-CM | POA: Diagnosis not present

## 2021-06-18 DIAGNOSIS — D689 Coagulation defect, unspecified: Secondary | ICD-10-CM | POA: Diagnosis not present

## 2021-06-18 DIAGNOSIS — N2581 Secondary hyperparathyroidism of renal origin: Secondary | ICD-10-CM | POA: Diagnosis not present

## 2021-06-18 DIAGNOSIS — N186 End stage renal disease: Secondary | ICD-10-CM | POA: Diagnosis not present

## 2021-06-18 DIAGNOSIS — D631 Anemia in chronic kidney disease: Secondary | ICD-10-CM | POA: Diagnosis not present

## 2021-06-18 DIAGNOSIS — Z992 Dependence on renal dialysis: Secondary | ICD-10-CM | POA: Diagnosis not present

## 2021-06-18 DIAGNOSIS — D509 Iron deficiency anemia, unspecified: Secondary | ICD-10-CM | POA: Diagnosis not present

## 2021-06-18 DIAGNOSIS — Z23 Encounter for immunization: Secondary | ICD-10-CM | POA: Diagnosis not present

## 2021-06-19 DIAGNOSIS — Z992 Dependence on renal dialysis: Secondary | ICD-10-CM | POA: Insufficient documentation

## 2021-06-20 DIAGNOSIS — Z992 Dependence on renal dialysis: Secondary | ICD-10-CM | POA: Diagnosis not present

## 2021-06-20 DIAGNOSIS — D509 Iron deficiency anemia, unspecified: Secondary | ICD-10-CM | POA: Diagnosis not present

## 2021-06-20 DIAGNOSIS — Z23 Encounter for immunization: Secondary | ICD-10-CM | POA: Diagnosis not present

## 2021-06-20 DIAGNOSIS — D689 Coagulation defect, unspecified: Secondary | ICD-10-CM | POA: Diagnosis not present

## 2021-06-20 DIAGNOSIS — N2581 Secondary hyperparathyroidism of renal origin: Secondary | ICD-10-CM | POA: Diagnosis not present

## 2021-06-20 DIAGNOSIS — D631 Anemia in chronic kidney disease: Secondary | ICD-10-CM | POA: Diagnosis not present

## 2021-06-20 DIAGNOSIS — N186 End stage renal disease: Secondary | ICD-10-CM | POA: Diagnosis not present

## 2021-06-22 DIAGNOSIS — Z992 Dependence on renal dialysis: Secondary | ICD-10-CM | POA: Diagnosis not present

## 2021-06-22 DIAGNOSIS — D689 Coagulation defect, unspecified: Secondary | ICD-10-CM | POA: Diagnosis not present

## 2021-06-22 DIAGNOSIS — Z23 Encounter for immunization: Secondary | ICD-10-CM | POA: Diagnosis not present

## 2021-06-22 DIAGNOSIS — N186 End stage renal disease: Secondary | ICD-10-CM | POA: Diagnosis not present

## 2021-06-22 DIAGNOSIS — N2581 Secondary hyperparathyroidism of renal origin: Secondary | ICD-10-CM | POA: Diagnosis not present

## 2021-06-22 DIAGNOSIS — D509 Iron deficiency anemia, unspecified: Secondary | ICD-10-CM | POA: Diagnosis not present

## 2021-06-22 DIAGNOSIS — D631 Anemia in chronic kidney disease: Secondary | ICD-10-CM | POA: Diagnosis not present

## 2021-06-25 DIAGNOSIS — D689 Coagulation defect, unspecified: Secondary | ICD-10-CM | POA: Diagnosis not present

## 2021-06-25 DIAGNOSIS — D631 Anemia in chronic kidney disease: Secondary | ICD-10-CM | POA: Diagnosis not present

## 2021-06-25 DIAGNOSIS — N2581 Secondary hyperparathyroidism of renal origin: Secondary | ICD-10-CM | POA: Diagnosis not present

## 2021-06-25 DIAGNOSIS — D509 Iron deficiency anemia, unspecified: Secondary | ICD-10-CM | POA: Diagnosis not present

## 2021-06-25 DIAGNOSIS — Z992 Dependence on renal dialysis: Secondary | ICD-10-CM | POA: Diagnosis not present

## 2021-06-25 DIAGNOSIS — N186 End stage renal disease: Secondary | ICD-10-CM | POA: Diagnosis not present

## 2021-06-27 DIAGNOSIS — D631 Anemia in chronic kidney disease: Secondary | ICD-10-CM | POA: Diagnosis not present

## 2021-06-27 DIAGNOSIS — D509 Iron deficiency anemia, unspecified: Secondary | ICD-10-CM | POA: Diagnosis not present

## 2021-06-27 DIAGNOSIS — N186 End stage renal disease: Secondary | ICD-10-CM | POA: Diagnosis not present

## 2021-06-27 DIAGNOSIS — Z992 Dependence on renal dialysis: Secondary | ICD-10-CM | POA: Diagnosis not present

## 2021-06-27 DIAGNOSIS — D689 Coagulation defect, unspecified: Secondary | ICD-10-CM | POA: Diagnosis not present

## 2021-06-27 DIAGNOSIS — N2581 Secondary hyperparathyroidism of renal origin: Secondary | ICD-10-CM | POA: Diagnosis not present

## 2021-06-29 DIAGNOSIS — D689 Coagulation defect, unspecified: Secondary | ICD-10-CM | POA: Diagnosis not present

## 2021-06-29 DIAGNOSIS — D631 Anemia in chronic kidney disease: Secondary | ICD-10-CM | POA: Diagnosis not present

## 2021-06-29 DIAGNOSIS — N186 End stage renal disease: Secondary | ICD-10-CM | POA: Diagnosis not present

## 2021-06-29 DIAGNOSIS — Z992 Dependence on renal dialysis: Secondary | ICD-10-CM | POA: Diagnosis not present

## 2021-06-29 DIAGNOSIS — D509 Iron deficiency anemia, unspecified: Secondary | ICD-10-CM | POA: Diagnosis not present

## 2021-06-29 DIAGNOSIS — N2581 Secondary hyperparathyroidism of renal origin: Secondary | ICD-10-CM | POA: Diagnosis not present

## 2021-07-02 DIAGNOSIS — Z992 Dependence on renal dialysis: Secondary | ICD-10-CM | POA: Diagnosis not present

## 2021-07-02 DIAGNOSIS — N186 End stage renal disease: Secondary | ICD-10-CM | POA: Diagnosis not present

## 2021-07-02 DIAGNOSIS — D509 Iron deficiency anemia, unspecified: Secondary | ICD-10-CM | POA: Diagnosis not present

## 2021-07-02 DIAGNOSIS — D631 Anemia in chronic kidney disease: Secondary | ICD-10-CM | POA: Diagnosis not present

## 2021-07-02 DIAGNOSIS — N2581 Secondary hyperparathyroidism of renal origin: Secondary | ICD-10-CM | POA: Diagnosis not present

## 2021-07-02 DIAGNOSIS — D689 Coagulation defect, unspecified: Secondary | ICD-10-CM | POA: Diagnosis not present

## 2021-07-04 DIAGNOSIS — N186 End stage renal disease: Secondary | ICD-10-CM | POA: Diagnosis not present

## 2021-07-04 DIAGNOSIS — D631 Anemia in chronic kidney disease: Secondary | ICD-10-CM | POA: Diagnosis not present

## 2021-07-04 DIAGNOSIS — Z992 Dependence on renal dialysis: Secondary | ICD-10-CM | POA: Diagnosis not present

## 2021-07-04 DIAGNOSIS — D509 Iron deficiency anemia, unspecified: Secondary | ICD-10-CM | POA: Diagnosis not present

## 2021-07-04 DIAGNOSIS — N2581 Secondary hyperparathyroidism of renal origin: Secondary | ICD-10-CM | POA: Diagnosis not present

## 2021-07-04 DIAGNOSIS — D689 Coagulation defect, unspecified: Secondary | ICD-10-CM | POA: Diagnosis not present

## 2021-07-06 DIAGNOSIS — D509 Iron deficiency anemia, unspecified: Secondary | ICD-10-CM | POA: Diagnosis not present

## 2021-07-06 DIAGNOSIS — N2581 Secondary hyperparathyroidism of renal origin: Secondary | ICD-10-CM | POA: Diagnosis not present

## 2021-07-06 DIAGNOSIS — D689 Coagulation defect, unspecified: Secondary | ICD-10-CM | POA: Diagnosis not present

## 2021-07-06 DIAGNOSIS — N186 End stage renal disease: Secondary | ICD-10-CM | POA: Diagnosis not present

## 2021-07-06 DIAGNOSIS — Z992 Dependence on renal dialysis: Secondary | ICD-10-CM | POA: Diagnosis not present

## 2021-07-06 DIAGNOSIS — D631 Anemia in chronic kidney disease: Secondary | ICD-10-CM | POA: Diagnosis not present

## 2021-07-09 DIAGNOSIS — D509 Iron deficiency anemia, unspecified: Secondary | ICD-10-CM | POA: Diagnosis not present

## 2021-07-09 DIAGNOSIS — D689 Coagulation defect, unspecified: Secondary | ICD-10-CM | POA: Diagnosis not present

## 2021-07-09 DIAGNOSIS — Z992 Dependence on renal dialysis: Secondary | ICD-10-CM | POA: Diagnosis not present

## 2021-07-09 DIAGNOSIS — N2581 Secondary hyperparathyroidism of renal origin: Secondary | ICD-10-CM | POA: Diagnosis not present

## 2021-07-09 DIAGNOSIS — N186 End stage renal disease: Secondary | ICD-10-CM | POA: Diagnosis not present

## 2021-07-09 DIAGNOSIS — D631 Anemia in chronic kidney disease: Secondary | ICD-10-CM | POA: Diagnosis not present

## 2021-07-12 DIAGNOSIS — D631 Anemia in chronic kidney disease: Secondary | ICD-10-CM | POA: Diagnosis not present

## 2021-07-12 DIAGNOSIS — D689 Coagulation defect, unspecified: Secondary | ICD-10-CM | POA: Diagnosis not present

## 2021-07-12 DIAGNOSIS — N186 End stage renal disease: Secondary | ICD-10-CM | POA: Diagnosis not present

## 2021-07-12 DIAGNOSIS — Z992 Dependence on renal dialysis: Secondary | ICD-10-CM | POA: Diagnosis not present

## 2021-07-12 DIAGNOSIS — N2581 Secondary hyperparathyroidism of renal origin: Secondary | ICD-10-CM | POA: Diagnosis not present

## 2021-07-12 DIAGNOSIS — D509 Iron deficiency anemia, unspecified: Secondary | ICD-10-CM | POA: Diagnosis not present

## 2021-07-14 DIAGNOSIS — D689 Coagulation defect, unspecified: Secondary | ICD-10-CM | POA: Diagnosis not present

## 2021-07-14 DIAGNOSIS — D631 Anemia in chronic kidney disease: Secondary | ICD-10-CM | POA: Diagnosis not present

## 2021-07-14 DIAGNOSIS — D509 Iron deficiency anemia, unspecified: Secondary | ICD-10-CM | POA: Diagnosis not present

## 2021-07-14 DIAGNOSIS — N186 End stage renal disease: Secondary | ICD-10-CM | POA: Diagnosis not present

## 2021-07-14 DIAGNOSIS — N2581 Secondary hyperparathyroidism of renal origin: Secondary | ICD-10-CM | POA: Diagnosis not present

## 2021-07-14 DIAGNOSIS — Z992 Dependence on renal dialysis: Secondary | ICD-10-CM | POA: Diagnosis not present

## 2021-07-16 DIAGNOSIS — D509 Iron deficiency anemia, unspecified: Secondary | ICD-10-CM | POA: Diagnosis not present

## 2021-07-16 DIAGNOSIS — N2581 Secondary hyperparathyroidism of renal origin: Secondary | ICD-10-CM | POA: Diagnosis not present

## 2021-07-16 DIAGNOSIS — D689 Coagulation defect, unspecified: Secondary | ICD-10-CM | POA: Diagnosis not present

## 2021-07-16 DIAGNOSIS — D631 Anemia in chronic kidney disease: Secondary | ICD-10-CM | POA: Diagnosis not present

## 2021-07-16 DIAGNOSIS — Z992 Dependence on renal dialysis: Secondary | ICD-10-CM | POA: Diagnosis not present

## 2021-07-16 DIAGNOSIS — N186 End stage renal disease: Secondary | ICD-10-CM | POA: Diagnosis not present

## 2021-07-17 DIAGNOSIS — I129 Hypertensive chronic kidney disease with stage 1 through stage 4 chronic kidney disease, or unspecified chronic kidney disease: Secondary | ICD-10-CM | POA: Diagnosis not present

## 2021-07-17 DIAGNOSIS — Z992 Dependence on renal dialysis: Secondary | ICD-10-CM | POA: Diagnosis not present

## 2021-07-17 DIAGNOSIS — N186 End stage renal disease: Secondary | ICD-10-CM | POA: Diagnosis not present

## 2021-07-18 DIAGNOSIS — N2581 Secondary hyperparathyroidism of renal origin: Secondary | ICD-10-CM | POA: Diagnosis not present

## 2021-07-18 DIAGNOSIS — Z992 Dependence on renal dialysis: Secondary | ICD-10-CM | POA: Diagnosis not present

## 2021-07-18 DIAGNOSIS — N186 End stage renal disease: Secondary | ICD-10-CM | POA: Diagnosis not present

## 2021-07-18 DIAGNOSIS — D509 Iron deficiency anemia, unspecified: Secondary | ICD-10-CM | POA: Diagnosis not present

## 2021-07-18 DIAGNOSIS — D631 Anemia in chronic kidney disease: Secondary | ICD-10-CM | POA: Diagnosis not present

## 2021-07-18 DIAGNOSIS — D689 Coagulation defect, unspecified: Secondary | ICD-10-CM | POA: Diagnosis not present

## 2021-07-20 DIAGNOSIS — D509 Iron deficiency anemia, unspecified: Secondary | ICD-10-CM | POA: Diagnosis not present

## 2021-07-20 DIAGNOSIS — D631 Anemia in chronic kidney disease: Secondary | ICD-10-CM | POA: Diagnosis not present

## 2021-07-20 DIAGNOSIS — D689 Coagulation defect, unspecified: Secondary | ICD-10-CM | POA: Diagnosis not present

## 2021-07-20 DIAGNOSIS — N186 End stage renal disease: Secondary | ICD-10-CM | POA: Diagnosis not present

## 2021-07-20 DIAGNOSIS — N2581 Secondary hyperparathyroidism of renal origin: Secondary | ICD-10-CM | POA: Diagnosis not present

## 2021-07-20 DIAGNOSIS — Z992 Dependence on renal dialysis: Secondary | ICD-10-CM | POA: Diagnosis not present

## 2021-07-22 ENCOUNTER — Other Ambulatory Visit: Payer: Self-pay

## 2021-07-22 ENCOUNTER — Ambulatory Visit: Payer: Medicare HMO | Admitting: Podiatry

## 2021-07-22 ENCOUNTER — Encounter: Payer: Self-pay | Admitting: Podiatry

## 2021-07-22 DIAGNOSIS — B353 Tinea pedis: Secondary | ICD-10-CM | POA: Diagnosis not present

## 2021-07-22 DIAGNOSIS — M79674 Pain in right toe(s): Secondary | ICD-10-CM | POA: Diagnosis not present

## 2021-07-22 DIAGNOSIS — N186 End stage renal disease: Secondary | ICD-10-CM

## 2021-07-22 DIAGNOSIS — B351 Tinea unguium: Secondary | ICD-10-CM | POA: Diagnosis not present

## 2021-07-22 DIAGNOSIS — Z992 Dependence on renal dialysis: Secondary | ICD-10-CM | POA: Diagnosis not present

## 2021-07-22 DIAGNOSIS — D689 Coagulation defect, unspecified: Secondary | ICD-10-CM | POA: Diagnosis not present

## 2021-07-22 DIAGNOSIS — M79675 Pain in left toe(s): Secondary | ICD-10-CM

## 2021-07-22 MED ORDER — ATHLETES FOOT POWDER SPRAY 2 % EX AERP: INHALATION_SPRAY | CUTANEOUS | 3 refills | Status: DC

## 2021-07-22 NOTE — Patient Instructions (Signed)
Discontinue applying moisturizing lotion between toes.  Spray work boots with Lysol disfinectant spray every evening.  Clean bathtub/shower with bleach based cleanser to avoid re-infecting yourself or passing athlete's feet to others.   Athlete's Foot Athlete's foot (tinea pedis) is a fungal infection of the skin on your feet. It often occurs on the skin that is between or underneath the toes. It can also occur on the soles of your feet. The infection can spread from person to person (is contagious). It can also spread when a person's bare feet come in contact with the fungus on shower floors or on items such as shoes. What are the causes? This condition is caused by a fungus that grows in warm, moist places. You can get athlete's foot by sharing shoes, shower stalls, towels, and wet floors with someone who is infected. Not washing your feet or changing your socks often enough can also lead to athlete's foot. What increases the risk? This condition is more likely to develop in: Men. People who have a weak body defense system (immune system). People who have diabetes. People who use public showers, such as at a gym. People who wear heavy-duty shoes, such as Environmental manager. Seasons with warm, humid weather. What are the signs or symptoms? Symptoms of this condition include: Itchy areas between your toes or on the soles of your feet. White, flaky, or scaly areas between your toes or on the soles of your feet. Very itchy small blisters between your toes or on the soles of your feet. Small cuts in your skin. These cuts can become infected. Thick or discolored toenails. How is this diagnosed? This condition may be diagnosed with a physical exam and a review of your medical history. Your health care provider may also take a skin or toenail sample to examine under a microscope. How is this treated? This condition is treated with antifungal medicines. These may be applied as  powders, ointments, or creams. In severe cases, an oral antifungal medicine may be given. Follow these instructions at home: Medicines Apply or take over-the-counter and prescription medicines only as told by your health care provider. Apply your antifungal medicine as told by your health care provider. Do not stop using the antifungal even if your condition improves. Foot care Do not scratch your feet. Keep your feet dry: Wear cotton or wool socks. Change your socks every day or if they become wet. Wear shoes that allow air to flow, such as sandals or canvas tennis shoes. Wash and dry your feet, including the area between your toes. Also, wash and dry your feet: Every day or as told by your health care provider. After exercising. General instructions Do not let others use towels, shoes, nail clippers, or other personal items that touch your feet. Protect your feet by wearing sandals in wet areas, such as locker rooms and shared showers. Keep all follow-up visits. This is important. If you have diabetes, keep your blood sugar under control. Contact a health care provider if: You have a fever. You have swelling, soreness, warmth, or redness in your foot. Your feet are not getting better with treatment. Your symptoms get worse. You have new symptoms. You have severe pain. Summary Athlete's foot (tinea pedis) is a fungal infection of the skin on your feet. It often occurs on skin that is between or underneath the toes. This condition is caused by a fungus that grows in warm, moist places. Symptoms include white, flaky, or scaly areas between your  toes or on the soles of your feet. This condition is treated with antifungal medicines. Keep your feet clean. Always dry them thoroughly. This information is not intended to replace advice given to you by your health care provider. Make sure you discuss any questions you have with your health care provider. Document Revised: 11/25/2020 Document  Reviewed: 11/25/2020 Elsevier Patient Education  [place].

## 2021-07-23 DIAGNOSIS — Z992 Dependence on renal dialysis: Secondary | ICD-10-CM | POA: Diagnosis not present

## 2021-07-23 DIAGNOSIS — Z23 Encounter for immunization: Secondary | ICD-10-CM | POA: Diagnosis not present

## 2021-07-23 DIAGNOSIS — N2581 Secondary hyperparathyroidism of renal origin: Secondary | ICD-10-CM | POA: Diagnosis not present

## 2021-07-23 DIAGNOSIS — N186 End stage renal disease: Secondary | ICD-10-CM | POA: Diagnosis not present

## 2021-07-23 DIAGNOSIS — D689 Coagulation defect, unspecified: Secondary | ICD-10-CM | POA: Diagnosis not present

## 2021-07-23 DIAGNOSIS — D509 Iron deficiency anemia, unspecified: Secondary | ICD-10-CM | POA: Diagnosis not present

## 2021-07-25 DIAGNOSIS — Z23 Encounter for immunization: Secondary | ICD-10-CM | POA: Diagnosis not present

## 2021-07-25 DIAGNOSIS — N2581 Secondary hyperparathyroidism of renal origin: Secondary | ICD-10-CM | POA: Diagnosis not present

## 2021-07-25 DIAGNOSIS — Z992 Dependence on renal dialysis: Secondary | ICD-10-CM | POA: Diagnosis not present

## 2021-07-25 DIAGNOSIS — D689 Coagulation defect, unspecified: Secondary | ICD-10-CM | POA: Diagnosis not present

## 2021-07-25 DIAGNOSIS — N186 End stage renal disease: Secondary | ICD-10-CM | POA: Diagnosis not present

## 2021-07-25 DIAGNOSIS — D509 Iron deficiency anemia, unspecified: Secondary | ICD-10-CM | POA: Diagnosis not present

## 2021-07-27 DIAGNOSIS — N2581 Secondary hyperparathyroidism of renal origin: Secondary | ICD-10-CM | POA: Diagnosis not present

## 2021-07-27 DIAGNOSIS — D509 Iron deficiency anemia, unspecified: Secondary | ICD-10-CM | POA: Diagnosis not present

## 2021-07-27 DIAGNOSIS — Z992 Dependence on renal dialysis: Secondary | ICD-10-CM | POA: Diagnosis not present

## 2021-07-27 DIAGNOSIS — D689 Coagulation defect, unspecified: Secondary | ICD-10-CM | POA: Diagnosis not present

## 2021-07-27 DIAGNOSIS — Z23 Encounter for immunization: Secondary | ICD-10-CM | POA: Diagnosis not present

## 2021-07-27 DIAGNOSIS — N186 End stage renal disease: Secondary | ICD-10-CM | POA: Diagnosis not present

## 2021-07-28 ENCOUNTER — Encounter: Payer: Self-pay | Admitting: Podiatry

## 2021-07-28 NOTE — Progress Notes (Signed)
  Subjective:  Patient ID: [name] Downs, male    DOB: 1966-04-10,  MRN: [id number]  [name] Downs presents to clinic today for at risk foot care. Patient has h/o ESRD on hemodialysis and painful elongated mycotic toenails 1-5 bilaterally which are tender when wearing enclosed shoe gear. Pain is relieved with periodic professional debridement.  New Problem: Patient states he has been sweating between his toes. States he was told to apply lotion between toes at dialysis center. He denies any redness, drainage, swelling or breaks in skin. Denies any fever, chills, night sweats, nausea or vomiting.  Patient states he is a former smoker who quit 3 years ago.  PCP is [name] Lei, MD , and last visit was 01/29/2021.  No Known Allergies  Review of Systems: Negative except as noted in the HPI. Objective:   Constitutional [name] Downs is a pleasant 55 y.o. African American male, obese in NAD. AAO x 3.   Vascular CFT immediate b/l LE. Palpable DP/PT pulses b/l LE. Digital hair sparse b/l. Skin temperature gradient WNL b/l. No pain with calf compression b/l. No edema noted b/l. No cyanosis or clubbing noted b/l LE.  Neurologic Normal speech. Oriented to person, place, and time. Protective sensation intact 5/5 intact bilaterally with 10g monofilament b/l. Vibratory sensation intact b/l.  Dermatologic Pedal integument with normal turgor, texture and tone BLE. No open wounds b/l LE. Interdigital maceration noted right foot 1-4  webspace(s). No blistering, no weeping, no open wounds. Toenails 1-5 b/l elongated, discolored, dystrophic, thickened, crumbly with subungual debris and tenderness to dorsal palpation. No hyperkeratotic nor porokeratotic lesions present on today's visit.  Orthopedic: Normal muscle strength 5/5 to all lower extremity muscle groups bilaterally. HAV with bunion deformity noted b/l LE.[name] No pain, crepitus or joint limitation noted with ROM b/l LE.  Patient ambulates independently without  assistive aids.   Radiographs: None   Assessment:   1. Pain due to onychomycosis of toenails of both feet   2. Tinea pedis of right foot   3. Coagulation defect, unspecified ([place]lie)   4. ESRD on dialysis [facility])    Plan:  Patient was evaluated and treated and all questions answered. Consent given for treatment as described below: -Examined patient. -Advised patient to discontinue applying moisturizing lotion between toes. He related understanding. -Patient instructed to spray Miconazole 2% spray powder between toes once daily. -Mycotic toenails 1-5 bilaterally were debrided in length and girth with sterile nail nippers and dremel without incident. -Patient/POA to call should there be question/concern in the interim.  Return in about 3 months (around 10/20/2021).  Marzetta Board, DPM

## 2021-07-30 DIAGNOSIS — D631 Anemia in chronic kidney disease: Secondary | ICD-10-CM | POA: Diagnosis not present

## 2021-07-30 DIAGNOSIS — N186 End stage renal disease: Secondary | ICD-10-CM | POA: Diagnosis not present

## 2021-07-30 DIAGNOSIS — Z992 Dependence on renal dialysis: Secondary | ICD-10-CM | POA: Diagnosis not present

## 2021-07-30 DIAGNOSIS — N2581 Secondary hyperparathyroidism of renal origin: Secondary | ICD-10-CM | POA: Diagnosis not present

## 2021-07-30 DIAGNOSIS — D689 Coagulation defect, unspecified: Secondary | ICD-10-CM | POA: Diagnosis not present

## 2021-08-01 DIAGNOSIS — D631 Anemia in chronic kidney disease: Secondary | ICD-10-CM | POA: Diagnosis not present

## 2021-08-01 DIAGNOSIS — N186 End stage renal disease: Secondary | ICD-10-CM | POA: Diagnosis not present

## 2021-08-01 DIAGNOSIS — Z992 Dependence on renal dialysis: Secondary | ICD-10-CM | POA: Diagnosis not present

## 2021-08-01 DIAGNOSIS — D689 Coagulation defect, unspecified: Secondary | ICD-10-CM | POA: Diagnosis not present

## 2021-08-01 DIAGNOSIS — N2581 Secondary hyperparathyroidism of renal origin: Secondary | ICD-10-CM | POA: Diagnosis not present

## 2021-08-03 DIAGNOSIS — N186 End stage renal disease: Secondary | ICD-10-CM | POA: Diagnosis not present

## 2021-08-03 DIAGNOSIS — N2581 Secondary hyperparathyroidism of renal origin: Secondary | ICD-10-CM | POA: Diagnosis not present

## 2021-08-03 DIAGNOSIS — Z992 Dependence on renal dialysis: Secondary | ICD-10-CM | POA: Diagnosis not present

## 2021-08-03 DIAGNOSIS — D689 Coagulation defect, unspecified: Secondary | ICD-10-CM | POA: Diagnosis not present

## 2021-08-03 DIAGNOSIS — D631 Anemia in chronic kidney disease: Secondary | ICD-10-CM | POA: Diagnosis not present

## 2021-08-06 DIAGNOSIS — Z23 Encounter for immunization: Secondary | ICD-10-CM | POA: Diagnosis not present

## 2021-08-06 DIAGNOSIS — Z992 Dependence on renal dialysis: Secondary | ICD-10-CM | POA: Diagnosis not present

## 2021-08-06 DIAGNOSIS — D689 Coagulation defect, unspecified: Secondary | ICD-10-CM | POA: Diagnosis not present

## 2021-08-06 DIAGNOSIS — D631 Anemia in chronic kidney disease: Secondary | ICD-10-CM | POA: Diagnosis not present

## 2021-08-06 DIAGNOSIS — N186 End stage renal disease: Secondary | ICD-10-CM | POA: Diagnosis not present

## 2021-08-06 DIAGNOSIS — N2581 Secondary hyperparathyroidism of renal origin: Secondary | ICD-10-CM | POA: Diagnosis not present

## 2021-08-08 DIAGNOSIS — N2581 Secondary hyperparathyroidism of renal origin: Secondary | ICD-10-CM | POA: Diagnosis not present

## 2021-08-08 DIAGNOSIS — Z23 Encounter for immunization: Secondary | ICD-10-CM | POA: Diagnosis not present

## 2021-08-08 DIAGNOSIS — D689 Coagulation defect, unspecified: Secondary | ICD-10-CM | POA: Diagnosis not present

## 2021-08-08 DIAGNOSIS — Z992 Dependence on renal dialysis: Secondary | ICD-10-CM | POA: Diagnosis not present

## 2021-08-08 DIAGNOSIS — D631 Anemia in chronic kidney disease: Secondary | ICD-10-CM | POA: Diagnosis not present

## 2021-08-08 DIAGNOSIS — N186 End stage renal disease: Secondary | ICD-10-CM | POA: Diagnosis not present

## 2021-08-10 DIAGNOSIS — Z23 Encounter for immunization: Secondary | ICD-10-CM | POA: Diagnosis not present

## 2021-08-10 DIAGNOSIS — D689 Coagulation defect, unspecified: Secondary | ICD-10-CM | POA: Diagnosis not present

## 2021-08-10 DIAGNOSIS — D631 Anemia in chronic kidney disease: Secondary | ICD-10-CM | POA: Diagnosis not present

## 2021-08-10 DIAGNOSIS — N186 End stage renal disease: Secondary | ICD-10-CM | POA: Diagnosis not present

## 2021-08-10 DIAGNOSIS — N2581 Secondary hyperparathyroidism of renal origin: Secondary | ICD-10-CM | POA: Diagnosis not present

## 2021-08-10 DIAGNOSIS — Z992 Dependence on renal dialysis: Secondary | ICD-10-CM | POA: Diagnosis not present

## 2021-08-13 DIAGNOSIS — N2581 Secondary hyperparathyroidism of renal origin: Secondary | ICD-10-CM | POA: Diagnosis not present

## 2021-08-13 DIAGNOSIS — D689 Coagulation defect, unspecified: Secondary | ICD-10-CM | POA: Diagnosis not present

## 2021-08-13 DIAGNOSIS — Z992 Dependence on renal dialysis: Secondary | ICD-10-CM | POA: Diagnosis not present

## 2021-08-13 DIAGNOSIS — N186 End stage renal disease: Secondary | ICD-10-CM | POA: Diagnosis not present

## 2021-08-13 DIAGNOSIS — D631 Anemia in chronic kidney disease: Secondary | ICD-10-CM | POA: Diagnosis not present

## 2021-08-15 DIAGNOSIS — D631 Anemia in chronic kidney disease: Secondary | ICD-10-CM | POA: Diagnosis not present

## 2021-08-15 DIAGNOSIS — Z992 Dependence on renal dialysis: Secondary | ICD-10-CM | POA: Diagnosis not present

## 2021-08-15 DIAGNOSIS — D689 Coagulation defect, unspecified: Secondary | ICD-10-CM | POA: Diagnosis not present

## 2021-08-15 DIAGNOSIS — N2581 Secondary hyperparathyroidism of renal origin: Secondary | ICD-10-CM | POA: Diagnosis not present

## 2021-08-15 DIAGNOSIS — N186 End stage renal disease: Secondary | ICD-10-CM | POA: Diagnosis not present

## 2021-08-16 DIAGNOSIS — I120 Hypertensive chronic kidney disease with stage 5 chronic kidney disease or end stage renal disease: Secondary | ICD-10-CM | POA: Diagnosis not present

## 2021-08-16 DIAGNOSIS — N185 Chronic kidney disease, stage 5: Secondary | ICD-10-CM | POA: Diagnosis not present

## 2021-08-16 DIAGNOSIS — E785 Hyperlipidemia, unspecified: Secondary | ICD-10-CM | POA: Diagnosis not present

## 2021-08-16 DIAGNOSIS — Z720 Tobacco use: Secondary | ICD-10-CM | POA: Diagnosis not present

## 2021-08-17 DIAGNOSIS — D631 Anemia in chronic kidney disease: Secondary | ICD-10-CM | POA: Diagnosis not present

## 2021-08-17 DIAGNOSIS — D689 Coagulation defect, unspecified: Secondary | ICD-10-CM | POA: Diagnosis not present

## 2021-08-17 DIAGNOSIS — N186 End stage renal disease: Secondary | ICD-10-CM | POA: Diagnosis not present

## 2021-08-17 DIAGNOSIS — Z992 Dependence on renal dialysis: Secondary | ICD-10-CM | POA: Diagnosis not present

## 2021-08-17 DIAGNOSIS — I129 Hypertensive chronic kidney disease with stage 1 through stage 4 chronic kidney disease, or unspecified chronic kidney disease: Secondary | ICD-10-CM | POA: Diagnosis not present

## 2021-08-17 DIAGNOSIS — N2581 Secondary hyperparathyroidism of renal origin: Secondary | ICD-10-CM | POA: Diagnosis not present

## 2021-08-20 DIAGNOSIS — D631 Anemia in chronic kidney disease: Secondary | ICD-10-CM | POA: Diagnosis not present

## 2021-08-20 DIAGNOSIS — D689 Coagulation defect, unspecified: Secondary | ICD-10-CM | POA: Diagnosis not present

## 2021-08-20 DIAGNOSIS — Z992 Dependence on renal dialysis: Secondary | ICD-10-CM | POA: Diagnosis not present

## 2021-08-20 DIAGNOSIS — N2581 Secondary hyperparathyroidism of renal origin: Secondary | ICD-10-CM | POA: Diagnosis not present

## 2021-08-20 DIAGNOSIS — N186 End stage renal disease: Secondary | ICD-10-CM | POA: Diagnosis not present

## 2021-08-21 DIAGNOSIS — F1011 Alcohol abuse, in remission: Secondary | ICD-10-CM | POA: Insufficient documentation

## 2021-08-22 DIAGNOSIS — N186 End stage renal disease: Secondary | ICD-10-CM | POA: Diagnosis not present

## 2021-08-22 DIAGNOSIS — Z992 Dependence on renal dialysis: Secondary | ICD-10-CM | POA: Diagnosis not present

## 2021-08-22 DIAGNOSIS — D631 Anemia in chronic kidney disease: Secondary | ICD-10-CM | POA: Diagnosis not present

## 2021-08-22 DIAGNOSIS — N2581 Secondary hyperparathyroidism of renal origin: Secondary | ICD-10-CM | POA: Diagnosis not present

## 2021-08-22 DIAGNOSIS — D689 Coagulation defect, unspecified: Secondary | ICD-10-CM | POA: Diagnosis not present

## 2021-08-24 DIAGNOSIS — Z992 Dependence on renal dialysis: Secondary | ICD-10-CM | POA: Diagnosis not present

## 2021-08-24 DIAGNOSIS — D689 Coagulation defect, unspecified: Secondary | ICD-10-CM | POA: Diagnosis not present

## 2021-08-24 DIAGNOSIS — N186 End stage renal disease: Secondary | ICD-10-CM | POA: Diagnosis not present

## 2021-08-24 DIAGNOSIS — N2581 Secondary hyperparathyroidism of renal origin: Secondary | ICD-10-CM | POA: Diagnosis not present

## 2021-08-24 DIAGNOSIS — D631 Anemia in chronic kidney disease: Secondary | ICD-10-CM | POA: Diagnosis not present

## 2021-08-27 DIAGNOSIS — Z992 Dependence on renal dialysis: Secondary | ICD-10-CM | POA: Diagnosis not present

## 2021-08-27 DIAGNOSIS — N186 End stage renal disease: Secondary | ICD-10-CM | POA: Diagnosis not present

## 2021-08-27 DIAGNOSIS — N2581 Secondary hyperparathyroidism of renal origin: Secondary | ICD-10-CM | POA: Diagnosis not present

## 2021-08-27 DIAGNOSIS — D509 Iron deficiency anemia, unspecified: Secondary | ICD-10-CM | POA: Diagnosis not present

## 2021-08-27 DIAGNOSIS — D689 Coagulation defect, unspecified: Secondary | ICD-10-CM | POA: Diagnosis not present

## 2021-08-29 DIAGNOSIS — D509 Iron deficiency anemia, unspecified: Secondary | ICD-10-CM | POA: Diagnosis not present

## 2021-08-29 DIAGNOSIS — N2581 Secondary hyperparathyroidism of renal origin: Secondary | ICD-10-CM | POA: Diagnosis not present

## 2021-08-29 DIAGNOSIS — D689 Coagulation defect, unspecified: Secondary | ICD-10-CM | POA: Diagnosis not present

## 2021-08-29 DIAGNOSIS — N186 End stage renal disease: Secondary | ICD-10-CM | POA: Diagnosis not present

## 2021-08-29 DIAGNOSIS — Z992 Dependence on renal dialysis: Secondary | ICD-10-CM | POA: Diagnosis not present

## 2021-08-31 DIAGNOSIS — Z992 Dependence on renal dialysis: Secondary | ICD-10-CM | POA: Diagnosis not present

## 2021-08-31 DIAGNOSIS — D689 Coagulation defect, unspecified: Secondary | ICD-10-CM | POA: Diagnosis not present

## 2021-08-31 DIAGNOSIS — D509 Iron deficiency anemia, unspecified: Secondary | ICD-10-CM | POA: Diagnosis not present

## 2021-08-31 DIAGNOSIS — N186 End stage renal disease: Secondary | ICD-10-CM | POA: Diagnosis not present

## 2021-08-31 DIAGNOSIS — N2581 Secondary hyperparathyroidism of renal origin: Secondary | ICD-10-CM | POA: Diagnosis not present

## 2021-09-03 DIAGNOSIS — D509 Iron deficiency anemia, unspecified: Secondary | ICD-10-CM | POA: Diagnosis not present

## 2021-09-03 DIAGNOSIS — Z992 Dependence on renal dialysis: Secondary | ICD-10-CM | POA: Diagnosis not present

## 2021-09-03 DIAGNOSIS — N186 End stage renal disease: Secondary | ICD-10-CM | POA: Diagnosis not present

## 2021-09-03 DIAGNOSIS — D631 Anemia in chronic kidney disease: Secondary | ICD-10-CM | POA: Diagnosis not present

## 2021-09-03 DIAGNOSIS — N2581 Secondary hyperparathyroidism of renal origin: Secondary | ICD-10-CM | POA: Diagnosis not present

## 2021-09-03 DIAGNOSIS — D689 Coagulation defect, unspecified: Secondary | ICD-10-CM | POA: Diagnosis not present

## 2021-09-05 DIAGNOSIS — D631 Anemia in chronic kidney disease: Secondary | ICD-10-CM | POA: Diagnosis not present

## 2021-09-05 DIAGNOSIS — N186 End stage renal disease: Secondary | ICD-10-CM | POA: Diagnosis not present

## 2021-09-05 DIAGNOSIS — Z992 Dependence on renal dialysis: Secondary | ICD-10-CM | POA: Diagnosis not present

## 2021-09-05 DIAGNOSIS — D509 Iron deficiency anemia, unspecified: Secondary | ICD-10-CM | POA: Diagnosis not present

## 2021-09-05 DIAGNOSIS — D689 Coagulation defect, unspecified: Secondary | ICD-10-CM | POA: Diagnosis not present

## 2021-09-05 DIAGNOSIS — N2581 Secondary hyperparathyroidism of renal origin: Secondary | ICD-10-CM | POA: Diagnosis not present

## 2021-09-07 DIAGNOSIS — N2581 Secondary hyperparathyroidism of renal origin: Secondary | ICD-10-CM | POA: Diagnosis not present

## 2021-09-07 DIAGNOSIS — Z992 Dependence on renal dialysis: Secondary | ICD-10-CM | POA: Diagnosis not present

## 2021-09-07 DIAGNOSIS — D509 Iron deficiency anemia, unspecified: Secondary | ICD-10-CM | POA: Diagnosis not present

## 2021-09-07 DIAGNOSIS — D631 Anemia in chronic kidney disease: Secondary | ICD-10-CM | POA: Diagnosis not present

## 2021-09-07 DIAGNOSIS — D689 Coagulation defect, unspecified: Secondary | ICD-10-CM | POA: Diagnosis not present

## 2021-09-07 DIAGNOSIS — N186 End stage renal disease: Secondary | ICD-10-CM | POA: Diagnosis not present

## 2021-09-10 DIAGNOSIS — Z992 Dependence on renal dialysis: Secondary | ICD-10-CM | POA: Diagnosis not present

## 2021-09-10 DIAGNOSIS — D631 Anemia in chronic kidney disease: Secondary | ICD-10-CM | POA: Diagnosis not present

## 2021-09-10 DIAGNOSIS — N2581 Secondary hyperparathyroidism of renal origin: Secondary | ICD-10-CM | POA: Diagnosis not present

## 2021-09-10 DIAGNOSIS — D689 Coagulation defect, unspecified: Secondary | ICD-10-CM | POA: Diagnosis not present

## 2021-09-10 DIAGNOSIS — N186 End stage renal disease: Secondary | ICD-10-CM | POA: Diagnosis not present

## 2021-09-12 DIAGNOSIS — N186 End stage renal disease: Secondary | ICD-10-CM | POA: Diagnosis not present

## 2021-09-12 DIAGNOSIS — Z992 Dependence on renal dialysis: Secondary | ICD-10-CM | POA: Diagnosis not present

## 2021-09-12 DIAGNOSIS — D689 Coagulation defect, unspecified: Secondary | ICD-10-CM | POA: Diagnosis not present

## 2021-09-12 DIAGNOSIS — D631 Anemia in chronic kidney disease: Secondary | ICD-10-CM | POA: Diagnosis not present

## 2021-09-12 DIAGNOSIS — N2581 Secondary hyperparathyroidism of renal origin: Secondary | ICD-10-CM | POA: Diagnosis not present

## 2021-09-14 DIAGNOSIS — D631 Anemia in chronic kidney disease: Secondary | ICD-10-CM | POA: Diagnosis not present

## 2021-09-14 DIAGNOSIS — D689 Coagulation defect, unspecified: Secondary | ICD-10-CM | POA: Diagnosis not present

## 2021-09-14 DIAGNOSIS — N186 End stage renal disease: Secondary | ICD-10-CM | POA: Diagnosis not present

## 2021-09-14 DIAGNOSIS — Z992 Dependence on renal dialysis: Secondary | ICD-10-CM | POA: Diagnosis not present

## 2021-09-14 DIAGNOSIS — N2581 Secondary hyperparathyroidism of renal origin: Secondary | ICD-10-CM | POA: Diagnosis not present

## 2021-09-15 DIAGNOSIS — N185 Chronic kidney disease, stage 5: Secondary | ICD-10-CM | POA: Diagnosis not present

## 2021-09-15 DIAGNOSIS — I120 Hypertensive chronic kidney disease with stage 5 chronic kidney disease or end stage renal disease: Secondary | ICD-10-CM | POA: Diagnosis not present

## 2021-09-15 DIAGNOSIS — E785 Hyperlipidemia, unspecified: Secondary | ICD-10-CM | POA: Diagnosis not present

## 2021-09-15 DIAGNOSIS — Z720 Tobacco use: Secondary | ICD-10-CM | POA: Diagnosis not present

## 2021-09-17 DIAGNOSIS — N2581 Secondary hyperparathyroidism of renal origin: Secondary | ICD-10-CM | POA: Diagnosis not present

## 2021-09-17 DIAGNOSIS — N186 End stage renal disease: Secondary | ICD-10-CM | POA: Diagnosis not present

## 2021-09-17 DIAGNOSIS — D689 Coagulation defect, unspecified: Secondary | ICD-10-CM | POA: Diagnosis not present

## 2021-09-17 DIAGNOSIS — Z992 Dependence on renal dialysis: Secondary | ICD-10-CM | POA: Diagnosis not present

## 2021-09-17 DIAGNOSIS — I129 Hypertensive chronic kidney disease with stage 1 through stage 4 chronic kidney disease, or unspecified chronic kidney disease: Secondary | ICD-10-CM | POA: Diagnosis not present

## 2021-09-19 DIAGNOSIS — D689 Coagulation defect, unspecified: Secondary | ICD-10-CM | POA: Diagnosis not present

## 2021-09-19 DIAGNOSIS — N2581 Secondary hyperparathyroidism of renal origin: Secondary | ICD-10-CM | POA: Diagnosis not present

## 2021-09-19 DIAGNOSIS — Z992 Dependence on renal dialysis: Secondary | ICD-10-CM | POA: Diagnosis not present

## 2021-09-19 DIAGNOSIS — N186 End stage renal disease: Secondary | ICD-10-CM | POA: Diagnosis not present

## 2021-09-20 DIAGNOSIS — N2581 Secondary hyperparathyroidism of renal origin: Secondary | ICD-10-CM | POA: Diagnosis not present

## 2021-09-20 DIAGNOSIS — N186 End stage renal disease: Secondary | ICD-10-CM | POA: Diagnosis not present

## 2021-09-20 DIAGNOSIS — D689 Coagulation defect, unspecified: Secondary | ICD-10-CM | POA: Diagnosis not present

## 2021-09-20 DIAGNOSIS — Z992 Dependence on renal dialysis: Secondary | ICD-10-CM | POA: Diagnosis not present

## 2021-09-21 DIAGNOSIS — N2581 Secondary hyperparathyroidism of renal origin: Secondary | ICD-10-CM | POA: Diagnosis not present

## 2021-09-21 DIAGNOSIS — N186 End stage renal disease: Secondary | ICD-10-CM | POA: Diagnosis not present

## 2021-09-21 DIAGNOSIS — D689 Coagulation defect, unspecified: Secondary | ICD-10-CM | POA: Diagnosis not present

## 2021-09-21 DIAGNOSIS — Z992 Dependence on renal dialysis: Secondary | ICD-10-CM | POA: Diagnosis not present

## 2021-09-24 DIAGNOSIS — D631 Anemia in chronic kidney disease: Secondary | ICD-10-CM | POA: Diagnosis not present

## 2021-09-24 DIAGNOSIS — N2581 Secondary hyperparathyroidism of renal origin: Secondary | ICD-10-CM | POA: Diagnosis not present

## 2021-09-24 DIAGNOSIS — D689 Coagulation defect, unspecified: Secondary | ICD-10-CM | POA: Diagnosis not present

## 2021-09-24 DIAGNOSIS — N186 End stage renal disease: Secondary | ICD-10-CM | POA: Diagnosis not present

## 2021-09-24 DIAGNOSIS — Z992 Dependence on renal dialysis: Secondary | ICD-10-CM | POA: Diagnosis not present

## 2021-09-25 DIAGNOSIS — Z992 Dependence on renal dialysis: Secondary | ICD-10-CM | POA: Diagnosis not present

## 2021-09-25 DIAGNOSIS — N186 End stage renal disease: Secondary | ICD-10-CM | POA: Diagnosis not present

## 2021-09-25 DIAGNOSIS — T82898A Other specified complication of vascular prosthetic devices, implants and grafts, initial encounter: Secondary | ICD-10-CM | POA: Diagnosis not present

## 2021-09-26 DIAGNOSIS — Z992 Dependence on renal dialysis: Secondary | ICD-10-CM | POA: Diagnosis not present

## 2021-09-26 DIAGNOSIS — D689 Coagulation defect, unspecified: Secondary | ICD-10-CM | POA: Diagnosis not present

## 2021-09-26 DIAGNOSIS — N186 End stage renal disease: Secondary | ICD-10-CM | POA: Diagnosis not present

## 2021-09-26 DIAGNOSIS — D631 Anemia in chronic kidney disease: Secondary | ICD-10-CM | POA: Diagnosis not present

## 2021-09-26 DIAGNOSIS — N2581 Secondary hyperparathyroidism of renal origin: Secondary | ICD-10-CM | POA: Diagnosis not present

## 2021-09-28 DIAGNOSIS — D631 Anemia in chronic kidney disease: Secondary | ICD-10-CM | POA: Diagnosis not present

## 2021-09-28 DIAGNOSIS — N186 End stage renal disease: Secondary | ICD-10-CM | POA: Diagnosis not present

## 2021-09-28 DIAGNOSIS — D689 Coagulation defect, unspecified: Secondary | ICD-10-CM | POA: Diagnosis not present

## 2021-09-28 DIAGNOSIS — Z992 Dependence on renal dialysis: Secondary | ICD-10-CM | POA: Diagnosis not present

## 2021-09-28 DIAGNOSIS — N2581 Secondary hyperparathyroidism of renal origin: Secondary | ICD-10-CM | POA: Diagnosis not present

## 2021-10-01 DIAGNOSIS — N2581 Secondary hyperparathyroidism of renal origin: Secondary | ICD-10-CM | POA: Diagnosis not present

## 2021-10-01 DIAGNOSIS — D631 Anemia in chronic kidney disease: Secondary | ICD-10-CM | POA: Diagnosis not present

## 2021-10-01 DIAGNOSIS — D689 Coagulation defect, unspecified: Secondary | ICD-10-CM | POA: Diagnosis not present

## 2021-10-01 DIAGNOSIS — Z992 Dependence on renal dialysis: Secondary | ICD-10-CM | POA: Diagnosis not present

## 2021-10-01 DIAGNOSIS — N186 End stage renal disease: Secondary | ICD-10-CM | POA: Diagnosis not present

## 2021-10-01 DIAGNOSIS — D509 Iron deficiency anemia, unspecified: Secondary | ICD-10-CM | POA: Diagnosis not present

## 2021-10-03 DIAGNOSIS — N2581 Secondary hyperparathyroidism of renal origin: Secondary | ICD-10-CM | POA: Diagnosis not present

## 2021-10-03 DIAGNOSIS — Z992 Dependence on renal dialysis: Secondary | ICD-10-CM | POA: Diagnosis not present

## 2021-10-03 DIAGNOSIS — N186 End stage renal disease: Secondary | ICD-10-CM | POA: Diagnosis not present

## 2021-10-03 DIAGNOSIS — D631 Anemia in chronic kidney disease: Secondary | ICD-10-CM | POA: Diagnosis not present

## 2021-10-03 DIAGNOSIS — D509 Iron deficiency anemia, unspecified: Secondary | ICD-10-CM | POA: Diagnosis not present

## 2021-10-03 DIAGNOSIS — D689 Coagulation defect, unspecified: Secondary | ICD-10-CM | POA: Diagnosis not present

## 2021-10-04 DIAGNOSIS — I10 Essential (primary) hypertension: Secondary | ICD-10-CM | POA: Diagnosis not present

## 2021-10-04 DIAGNOSIS — N186 End stage renal disease: Secondary | ICD-10-CM | POA: Diagnosis not present

## 2021-10-04 DIAGNOSIS — J301 Allergic rhinitis due to pollen: Secondary | ICD-10-CM | POA: Diagnosis not present

## 2021-10-04 DIAGNOSIS — E785 Hyperlipidemia, unspecified: Secondary | ICD-10-CM | POA: Diagnosis not present

## 2021-10-05 DIAGNOSIS — D689 Coagulation defect, unspecified: Secondary | ICD-10-CM | POA: Diagnosis not present

## 2021-10-05 DIAGNOSIS — D631 Anemia in chronic kidney disease: Secondary | ICD-10-CM | POA: Diagnosis not present

## 2021-10-05 DIAGNOSIS — N2581 Secondary hyperparathyroidism of renal origin: Secondary | ICD-10-CM | POA: Diagnosis not present

## 2021-10-05 DIAGNOSIS — Z992 Dependence on renal dialysis: Secondary | ICD-10-CM | POA: Diagnosis not present

## 2021-10-05 DIAGNOSIS — N186 End stage renal disease: Secondary | ICD-10-CM | POA: Diagnosis not present

## 2021-10-05 DIAGNOSIS — D509 Iron deficiency anemia, unspecified: Secondary | ICD-10-CM | POA: Diagnosis not present

## 2021-10-08 DIAGNOSIS — N2581 Secondary hyperparathyroidism of renal origin: Secondary | ICD-10-CM | POA: Diagnosis not present

## 2021-10-08 DIAGNOSIS — D689 Coagulation defect, unspecified: Secondary | ICD-10-CM | POA: Diagnosis not present

## 2021-10-08 DIAGNOSIS — Z992 Dependence on renal dialysis: Secondary | ICD-10-CM | POA: Diagnosis not present

## 2021-10-08 DIAGNOSIS — D509 Iron deficiency anemia, unspecified: Secondary | ICD-10-CM | POA: Diagnosis not present

## 2021-10-08 DIAGNOSIS — N186 End stage renal disease: Secondary | ICD-10-CM | POA: Diagnosis not present

## 2021-10-08 DIAGNOSIS — D631 Anemia in chronic kidney disease: Secondary | ICD-10-CM | POA: Diagnosis not present

## 2021-10-10 DIAGNOSIS — Z992 Dependence on renal dialysis: Secondary | ICD-10-CM | POA: Diagnosis not present

## 2021-10-10 DIAGNOSIS — N2581 Secondary hyperparathyroidism of renal origin: Secondary | ICD-10-CM | POA: Diagnosis not present

## 2021-10-10 DIAGNOSIS — D509 Iron deficiency anemia, unspecified: Secondary | ICD-10-CM | POA: Diagnosis not present

## 2021-10-10 DIAGNOSIS — N186 End stage renal disease: Secondary | ICD-10-CM | POA: Diagnosis not present

## 2021-10-10 DIAGNOSIS — D631 Anemia in chronic kidney disease: Secondary | ICD-10-CM | POA: Diagnosis not present

## 2021-10-10 DIAGNOSIS — D689 Coagulation defect, unspecified: Secondary | ICD-10-CM | POA: Diagnosis not present

## 2021-10-12 DIAGNOSIS — N2581 Secondary hyperparathyroidism of renal origin: Secondary | ICD-10-CM | POA: Diagnosis not present

## 2021-10-12 DIAGNOSIS — D689 Coagulation defect, unspecified: Secondary | ICD-10-CM | POA: Diagnosis not present

## 2021-10-12 DIAGNOSIS — N186 End stage renal disease: Secondary | ICD-10-CM | POA: Diagnosis not present

## 2021-10-12 DIAGNOSIS — D509 Iron deficiency anemia, unspecified: Secondary | ICD-10-CM | POA: Diagnosis not present

## 2021-10-12 DIAGNOSIS — D631 Anemia in chronic kidney disease: Secondary | ICD-10-CM | POA: Diagnosis not present

## 2021-10-12 DIAGNOSIS — Z992 Dependence on renal dialysis: Secondary | ICD-10-CM | POA: Diagnosis not present

## 2021-10-15 DIAGNOSIS — Z992 Dependence on renal dialysis: Secondary | ICD-10-CM | POA: Diagnosis not present

## 2021-10-15 DIAGNOSIS — D631 Anemia in chronic kidney disease: Secondary | ICD-10-CM | POA: Diagnosis not present

## 2021-10-15 DIAGNOSIS — I129 Hypertensive chronic kidney disease with stage 1 through stage 4 chronic kidney disease, or unspecified chronic kidney disease: Secondary | ICD-10-CM | POA: Diagnosis not present

## 2021-10-15 DIAGNOSIS — N2581 Secondary hyperparathyroidism of renal origin: Secondary | ICD-10-CM | POA: Diagnosis not present

## 2021-10-15 DIAGNOSIS — N186 End stage renal disease: Secondary | ICD-10-CM | POA: Diagnosis not present

## 2021-10-15 DIAGNOSIS — I120 Hypertensive chronic kidney disease with stage 5 chronic kidney disease or end stage renal disease: Secondary | ICD-10-CM | POA: Diagnosis not present

## 2021-10-15 DIAGNOSIS — N185 Chronic kidney disease, stage 5: Secondary | ICD-10-CM | POA: Diagnosis not present

## 2021-10-15 DIAGNOSIS — D689 Coagulation defect, unspecified: Secondary | ICD-10-CM | POA: Diagnosis not present

## 2021-10-15 DIAGNOSIS — E785 Hyperlipidemia, unspecified: Secondary | ICD-10-CM | POA: Diagnosis not present

## 2021-10-15 DIAGNOSIS — Z720 Tobacco use: Secondary | ICD-10-CM | POA: Diagnosis not present

## 2021-10-17 DIAGNOSIS — Z992 Dependence on renal dialysis: Secondary | ICD-10-CM | POA: Diagnosis not present

## 2021-10-17 DIAGNOSIS — N2581 Secondary hyperparathyroidism of renal origin: Secondary | ICD-10-CM | POA: Diagnosis not present

## 2021-10-17 DIAGNOSIS — N186 End stage renal disease: Secondary | ICD-10-CM | POA: Diagnosis not present

## 2021-10-17 DIAGNOSIS — D689 Coagulation defect, unspecified: Secondary | ICD-10-CM | POA: Diagnosis not present

## 2021-10-19 DIAGNOSIS — D689 Coagulation defect, unspecified: Secondary | ICD-10-CM | POA: Diagnosis not present

## 2021-10-19 DIAGNOSIS — N2581 Secondary hyperparathyroidism of renal origin: Secondary | ICD-10-CM | POA: Diagnosis not present

## 2021-10-19 DIAGNOSIS — N186 End stage renal disease: Secondary | ICD-10-CM | POA: Diagnosis not present

## 2021-10-19 DIAGNOSIS — Z992 Dependence on renal dialysis: Secondary | ICD-10-CM | POA: Diagnosis not present

## 2021-10-22 DIAGNOSIS — Z992 Dependence on renal dialysis: Secondary | ICD-10-CM | POA: Diagnosis not present

## 2021-10-22 DIAGNOSIS — N2581 Secondary hyperparathyroidism of renal origin: Secondary | ICD-10-CM | POA: Diagnosis not present

## 2021-10-22 DIAGNOSIS — D689 Coagulation defect, unspecified: Secondary | ICD-10-CM | POA: Diagnosis not present

## 2021-10-22 DIAGNOSIS — D631 Anemia in chronic kidney disease: Secondary | ICD-10-CM | POA: Diagnosis not present

## 2021-10-22 DIAGNOSIS — N186 End stage renal disease: Secondary | ICD-10-CM | POA: Diagnosis not present

## 2021-10-24 DIAGNOSIS — N186 End stage renal disease: Secondary | ICD-10-CM | POA: Diagnosis not present

## 2021-10-24 DIAGNOSIS — Z992 Dependence on renal dialysis: Secondary | ICD-10-CM | POA: Diagnosis not present

## 2021-10-24 DIAGNOSIS — N2581 Secondary hyperparathyroidism of renal origin: Secondary | ICD-10-CM | POA: Diagnosis not present

## 2021-10-24 DIAGNOSIS — D631 Anemia in chronic kidney disease: Secondary | ICD-10-CM | POA: Diagnosis not present

## 2021-10-24 DIAGNOSIS — D689 Coagulation defect, unspecified: Secondary | ICD-10-CM | POA: Diagnosis not present

## 2021-10-26 DIAGNOSIS — N186 End stage renal disease: Secondary | ICD-10-CM | POA: Diagnosis not present

## 2021-10-26 DIAGNOSIS — D631 Anemia in chronic kidney disease: Secondary | ICD-10-CM | POA: Diagnosis not present

## 2021-10-26 DIAGNOSIS — D689 Coagulation defect, unspecified: Secondary | ICD-10-CM | POA: Diagnosis not present

## 2021-10-26 DIAGNOSIS — Z992 Dependence on renal dialysis: Secondary | ICD-10-CM | POA: Diagnosis not present

## 2021-10-26 DIAGNOSIS — N2581 Secondary hyperparathyroidism of renal origin: Secondary | ICD-10-CM | POA: Diagnosis not present

## 2021-10-29 DIAGNOSIS — N186 End stage renal disease: Secondary | ICD-10-CM | POA: Diagnosis not present

## 2021-10-29 DIAGNOSIS — D631 Anemia in chronic kidney disease: Secondary | ICD-10-CM | POA: Diagnosis not present

## 2021-10-29 DIAGNOSIS — N2581 Secondary hyperparathyroidism of renal origin: Secondary | ICD-10-CM | POA: Diagnosis not present

## 2021-10-29 DIAGNOSIS — D689 Coagulation defect, unspecified: Secondary | ICD-10-CM | POA: Diagnosis not present

## 2021-10-29 DIAGNOSIS — D509 Iron deficiency anemia, unspecified: Secondary | ICD-10-CM | POA: Diagnosis not present

## 2021-10-29 DIAGNOSIS — Z992 Dependence on renal dialysis: Secondary | ICD-10-CM | POA: Diagnosis not present

## 2021-10-30 ENCOUNTER — Other Ambulatory Visit: Payer: Self-pay

## 2021-10-30 ENCOUNTER — Ambulatory Visit (INDEPENDENT_AMBULATORY_CARE_PROVIDER_SITE_OTHER): Payer: Medicare HMO | Admitting: Podiatry

## 2021-10-30 DIAGNOSIS — M79675 Pain in left toe(s): Secondary | ICD-10-CM

## 2021-10-30 DIAGNOSIS — M79674 Pain in right toe(s): Secondary | ICD-10-CM

## 2021-10-30 DIAGNOSIS — B351 Tinea unguium: Secondary | ICD-10-CM

## 2021-10-31 DIAGNOSIS — N186 End stage renal disease: Secondary | ICD-10-CM | POA: Diagnosis not present

## 2021-10-31 DIAGNOSIS — D509 Iron deficiency anemia, unspecified: Secondary | ICD-10-CM | POA: Diagnosis not present

## 2021-10-31 DIAGNOSIS — Z992 Dependence on renal dialysis: Secondary | ICD-10-CM | POA: Diagnosis not present

## 2021-10-31 DIAGNOSIS — D689 Coagulation defect, unspecified: Secondary | ICD-10-CM | POA: Diagnosis not present

## 2021-10-31 DIAGNOSIS — D631 Anemia in chronic kidney disease: Secondary | ICD-10-CM | POA: Diagnosis not present

## 2021-10-31 DIAGNOSIS — N2581 Secondary hyperparathyroidism of renal origin: Secondary | ICD-10-CM | POA: Diagnosis not present

## 2021-11-02 DIAGNOSIS — D689 Coagulation defect, unspecified: Secondary | ICD-10-CM | POA: Diagnosis not present

## 2021-11-02 DIAGNOSIS — Z992 Dependence on renal dialysis: Secondary | ICD-10-CM | POA: Diagnosis not present

## 2021-11-02 DIAGNOSIS — N2581 Secondary hyperparathyroidism of renal origin: Secondary | ICD-10-CM | POA: Diagnosis not present

## 2021-11-02 DIAGNOSIS — N186 End stage renal disease: Secondary | ICD-10-CM | POA: Diagnosis not present

## 2021-11-02 DIAGNOSIS — D631 Anemia in chronic kidney disease: Secondary | ICD-10-CM | POA: Diagnosis not present

## 2021-11-02 DIAGNOSIS — D509 Iron deficiency anemia, unspecified: Secondary | ICD-10-CM | POA: Diagnosis not present

## 2021-11-04 DIAGNOSIS — H25811 Combined forms of age-related cataract, right eye: Secondary | ICD-10-CM | POA: Diagnosis not present

## 2021-11-04 DIAGNOSIS — H40023 Open angle with borderline findings, high risk, bilateral: Secondary | ICD-10-CM | POA: Diagnosis not present

## 2021-11-05 DIAGNOSIS — Z992 Dependence on renal dialysis: Secondary | ICD-10-CM | POA: Diagnosis not present

## 2021-11-05 DIAGNOSIS — N186 End stage renal disease: Secondary | ICD-10-CM | POA: Diagnosis not present

## 2021-11-05 DIAGNOSIS — N2581 Secondary hyperparathyroidism of renal origin: Secondary | ICD-10-CM | POA: Diagnosis not present

## 2021-11-05 DIAGNOSIS — D631 Anemia in chronic kidney disease: Secondary | ICD-10-CM | POA: Diagnosis not present

## 2021-11-05 DIAGNOSIS — D689 Coagulation defect, unspecified: Secondary | ICD-10-CM | POA: Diagnosis not present

## 2021-11-05 DIAGNOSIS — Z23 Encounter for immunization: Secondary | ICD-10-CM | POA: Diagnosis not present

## 2021-11-06 DIAGNOSIS — I952 Hypotension due to drugs: Secondary | ICD-10-CM | POA: Diagnosis not present

## 2021-11-06 DIAGNOSIS — K5900 Constipation, unspecified: Secondary | ICD-10-CM | POA: Diagnosis not present

## 2021-11-06 DIAGNOSIS — N185 Chronic kidney disease, stage 5: Secondary | ICD-10-CM | POA: Diagnosis not present

## 2021-11-06 DIAGNOSIS — D631 Anemia in chronic kidney disease: Secondary | ICD-10-CM | POA: Diagnosis not present

## 2021-11-07 DIAGNOSIS — N186 End stage renal disease: Secondary | ICD-10-CM | POA: Diagnosis not present

## 2021-11-07 DIAGNOSIS — Z992 Dependence on renal dialysis: Secondary | ICD-10-CM | POA: Diagnosis not present

## 2021-11-07 DIAGNOSIS — D631 Anemia in chronic kidney disease: Secondary | ICD-10-CM | POA: Diagnosis not present

## 2021-11-07 DIAGNOSIS — N2581 Secondary hyperparathyroidism of renal origin: Secondary | ICD-10-CM | POA: Diagnosis not present

## 2021-11-07 DIAGNOSIS — Z23 Encounter for immunization: Secondary | ICD-10-CM | POA: Diagnosis not present

## 2021-11-07 DIAGNOSIS — D689 Coagulation defect, unspecified: Secondary | ICD-10-CM | POA: Diagnosis not present

## 2021-11-09 DIAGNOSIS — N2581 Secondary hyperparathyroidism of renal origin: Secondary | ICD-10-CM | POA: Diagnosis not present

## 2021-11-09 DIAGNOSIS — D631 Anemia in chronic kidney disease: Secondary | ICD-10-CM | POA: Diagnosis not present

## 2021-11-09 DIAGNOSIS — Z992 Dependence on renal dialysis: Secondary | ICD-10-CM | POA: Diagnosis not present

## 2021-11-09 DIAGNOSIS — D689 Coagulation defect, unspecified: Secondary | ICD-10-CM | POA: Diagnosis not present

## 2021-11-09 DIAGNOSIS — N186 End stage renal disease: Secondary | ICD-10-CM | POA: Diagnosis not present

## 2021-11-09 DIAGNOSIS — Z23 Encounter for immunization: Secondary | ICD-10-CM | POA: Diagnosis not present

## 2021-11-12 DIAGNOSIS — E875 Hyperkalemia: Secondary | ICD-10-CM | POA: Diagnosis not present

## 2021-11-12 DIAGNOSIS — N2581 Secondary hyperparathyroidism of renal origin: Secondary | ICD-10-CM | POA: Diagnosis not present

## 2021-11-12 DIAGNOSIS — D689 Coagulation defect, unspecified: Secondary | ICD-10-CM | POA: Diagnosis not present

## 2021-11-12 DIAGNOSIS — D631 Anemia in chronic kidney disease: Secondary | ICD-10-CM | POA: Diagnosis not present

## 2021-11-12 DIAGNOSIS — Z992 Dependence on renal dialysis: Secondary | ICD-10-CM | POA: Diagnosis not present

## 2021-11-12 DIAGNOSIS — N186 End stage renal disease: Secondary | ICD-10-CM | POA: Diagnosis not present

## 2021-11-13 ENCOUNTER — Ambulatory Visit (INDEPENDENT_AMBULATORY_CARE_PROVIDER_SITE_OTHER): Payer: Self-pay | Admitting: Podiatry

## 2021-11-13 DIAGNOSIS — Z91199 Patient's noncompliance with other medical treatment and regimen due to unspecified reason: Secondary | ICD-10-CM

## 2021-11-13 NOTE — Progress Notes (Signed)
No show

## 2021-11-14 DIAGNOSIS — N2581 Secondary hyperparathyroidism of renal origin: Secondary | ICD-10-CM | POA: Diagnosis not present

## 2021-11-14 DIAGNOSIS — Z992 Dependence on renal dialysis: Secondary | ICD-10-CM | POA: Diagnosis not present

## 2021-11-14 DIAGNOSIS — N186 End stage renal disease: Secondary | ICD-10-CM | POA: Diagnosis not present

## 2021-11-14 DIAGNOSIS — E875 Hyperkalemia: Secondary | ICD-10-CM | POA: Diagnosis not present

## 2021-11-14 DIAGNOSIS — D631 Anemia in chronic kidney disease: Secondary | ICD-10-CM | POA: Diagnosis not present

## 2021-11-14 DIAGNOSIS — D689 Coagulation defect, unspecified: Secondary | ICD-10-CM | POA: Diagnosis not present

## 2021-11-15 DIAGNOSIS — E785 Hyperlipidemia, unspecified: Secondary | ICD-10-CM | POA: Diagnosis not present

## 2021-11-15 DIAGNOSIS — N186 End stage renal disease: Secondary | ICD-10-CM | POA: Diagnosis not present

## 2021-11-15 DIAGNOSIS — I120 Hypertensive chronic kidney disease with stage 5 chronic kidney disease or end stage renal disease: Secondary | ICD-10-CM | POA: Diagnosis not present

## 2021-11-15 DIAGNOSIS — Z992 Dependence on renal dialysis: Secondary | ICD-10-CM | POA: Diagnosis not present

## 2021-11-15 DIAGNOSIS — I129 Hypertensive chronic kidney disease with stage 1 through stage 4 chronic kidney disease, or unspecified chronic kidney disease: Secondary | ICD-10-CM | POA: Diagnosis not present

## 2021-11-15 DIAGNOSIS — N185 Chronic kidney disease, stage 5: Secondary | ICD-10-CM | POA: Diagnosis not present

## 2021-11-16 DIAGNOSIS — Z992 Dependence on renal dialysis: Secondary | ICD-10-CM | POA: Diagnosis not present

## 2021-11-16 DIAGNOSIS — N2581 Secondary hyperparathyroidism of renal origin: Secondary | ICD-10-CM | POA: Diagnosis not present

## 2021-11-16 DIAGNOSIS — N186 End stage renal disease: Secondary | ICD-10-CM | POA: Diagnosis not present

## 2021-11-16 DIAGNOSIS — D689 Coagulation defect, unspecified: Secondary | ICD-10-CM | POA: Diagnosis not present

## 2021-11-18 DIAGNOSIS — Z992 Dependence on renal dialysis: Secondary | ICD-10-CM | POA: Diagnosis not present

## 2021-11-18 DIAGNOSIS — I952 Hypotension due to drugs: Secondary | ICD-10-CM | POA: Diagnosis not present

## 2021-11-18 DIAGNOSIS — N186 End stage renal disease: Secondary | ICD-10-CM | POA: Diagnosis not present

## 2021-11-18 DIAGNOSIS — I10 Essential (primary) hypertension: Secondary | ICD-10-CM | POA: Diagnosis not present

## 2021-11-18 DIAGNOSIS — E669 Obesity, unspecified: Secondary | ICD-10-CM | POA: Diagnosis not present

## 2021-11-19 ENCOUNTER — Other Ambulatory Visit: Payer: Self-pay | Admitting: Podiatry

## 2021-11-19 DIAGNOSIS — N2581 Secondary hyperparathyroidism of renal origin: Secondary | ICD-10-CM | POA: Diagnosis not present

## 2021-11-19 DIAGNOSIS — D631 Anemia in chronic kidney disease: Secondary | ICD-10-CM | POA: Diagnosis not present

## 2021-11-19 DIAGNOSIS — D689 Coagulation defect, unspecified: Secondary | ICD-10-CM | POA: Diagnosis not present

## 2021-11-19 DIAGNOSIS — Z992 Dependence on renal dialysis: Secondary | ICD-10-CM | POA: Diagnosis not present

## 2021-11-19 DIAGNOSIS — N186 End stage renal disease: Secondary | ICD-10-CM | POA: Diagnosis not present

## 2021-11-19 DIAGNOSIS — B353 Tinea pedis: Secondary | ICD-10-CM

## 2021-11-20 DIAGNOSIS — I120 Hypertensive chronic kidney disease with stage 5 chronic kidney disease or end stage renal disease: Secondary | ICD-10-CM | POA: Diagnosis not present

## 2021-11-20 DIAGNOSIS — Z6831 Body mass index (BMI) 31.0-31.9, adult: Secondary | ICD-10-CM | POA: Diagnosis not present

## 2021-11-20 DIAGNOSIS — Z008 Encounter for other general examination: Secondary | ICD-10-CM | POA: Diagnosis not present

## 2021-11-20 DIAGNOSIS — Z992 Dependence on renal dialysis: Secondary | ICD-10-CM | POA: Diagnosis not present

## 2021-11-20 DIAGNOSIS — M109 Gout, unspecified: Secondary | ICD-10-CM | POA: Diagnosis not present

## 2021-11-20 DIAGNOSIS — N186 End stage renal disease: Secondary | ICD-10-CM | POA: Diagnosis not present

## 2021-11-20 DIAGNOSIS — E669 Obesity, unspecified: Secondary | ICD-10-CM | POA: Diagnosis not present

## 2021-11-20 DIAGNOSIS — Z8249 Family history of ischemic heart disease and other diseases of the circulatory system: Secondary | ICD-10-CM | POA: Diagnosis not present

## 2021-11-20 DIAGNOSIS — Z87891 Personal history of nicotine dependence: Secondary | ICD-10-CM | POA: Diagnosis not present

## 2021-11-20 DIAGNOSIS — I739 Peripheral vascular disease, unspecified: Secondary | ICD-10-CM | POA: Diagnosis not present

## 2021-11-21 DIAGNOSIS — Z992 Dependence on renal dialysis: Secondary | ICD-10-CM | POA: Diagnosis not present

## 2021-11-21 DIAGNOSIS — D689 Coagulation defect, unspecified: Secondary | ICD-10-CM | POA: Diagnosis not present

## 2021-11-21 DIAGNOSIS — N186 End stage renal disease: Secondary | ICD-10-CM | POA: Diagnosis not present

## 2021-11-21 DIAGNOSIS — N2581 Secondary hyperparathyroidism of renal origin: Secondary | ICD-10-CM | POA: Diagnosis not present

## 2021-11-21 DIAGNOSIS — D631 Anemia in chronic kidney disease: Secondary | ICD-10-CM | POA: Diagnosis not present

## 2021-11-23 DIAGNOSIS — Z992 Dependence on renal dialysis: Secondary | ICD-10-CM | POA: Diagnosis not present

## 2021-11-23 DIAGNOSIS — N2581 Secondary hyperparathyroidism of renal origin: Secondary | ICD-10-CM | POA: Diagnosis not present

## 2021-11-23 DIAGNOSIS — D631 Anemia in chronic kidney disease: Secondary | ICD-10-CM | POA: Diagnosis not present

## 2021-11-23 DIAGNOSIS — D689 Coagulation defect, unspecified: Secondary | ICD-10-CM | POA: Diagnosis not present

## 2021-11-23 DIAGNOSIS — N186 End stage renal disease: Secondary | ICD-10-CM | POA: Diagnosis not present

## 2021-11-25 DIAGNOSIS — I7389 Other specified peripheral vascular diseases: Secondary | ICD-10-CM | POA: Diagnosis not present

## 2021-11-25 DIAGNOSIS — N184 Chronic kidney disease, stage 4 (severe): Secondary | ICD-10-CM | POA: Diagnosis not present

## 2021-11-25 NOTE — Progress Notes (Signed)
Appointment canceled by provider ?

## 2021-11-26 DIAGNOSIS — Z992 Dependence on renal dialysis: Secondary | ICD-10-CM | POA: Diagnosis not present

## 2021-11-26 DIAGNOSIS — D509 Iron deficiency anemia, unspecified: Secondary | ICD-10-CM | POA: Diagnosis not present

## 2021-11-26 DIAGNOSIS — N2581 Secondary hyperparathyroidism of renal origin: Secondary | ICD-10-CM | POA: Diagnosis not present

## 2021-11-26 DIAGNOSIS — D631 Anemia in chronic kidney disease: Secondary | ICD-10-CM | POA: Diagnosis not present

## 2021-11-26 DIAGNOSIS — D689 Coagulation defect, unspecified: Secondary | ICD-10-CM | POA: Diagnosis not present

## 2021-11-26 DIAGNOSIS — N186 End stage renal disease: Secondary | ICD-10-CM | POA: Diagnosis not present

## 2021-11-27 DIAGNOSIS — E785 Hyperlipidemia, unspecified: Secondary | ICD-10-CM | POA: Diagnosis not present

## 2021-11-27 DIAGNOSIS — R931 Abnormal findings on diagnostic imaging of heart and coronary circulation: Secondary | ICD-10-CM | POA: Diagnosis not present

## 2021-11-27 DIAGNOSIS — I959 Hypotension, unspecified: Secondary | ICD-10-CM | POA: Diagnosis not present

## 2021-11-27 DIAGNOSIS — Z992 Dependence on renal dialysis: Secondary | ICD-10-CM | POA: Diagnosis not present

## 2021-11-27 DIAGNOSIS — N186 End stage renal disease: Secondary | ICD-10-CM | POA: Diagnosis not present

## 2021-11-28 DIAGNOSIS — D509 Iron deficiency anemia, unspecified: Secondary | ICD-10-CM | POA: Diagnosis not present

## 2021-11-28 DIAGNOSIS — D689 Coagulation defect, unspecified: Secondary | ICD-10-CM | POA: Diagnosis not present

## 2021-11-28 DIAGNOSIS — Z992 Dependence on renal dialysis: Secondary | ICD-10-CM | POA: Diagnosis not present

## 2021-11-28 DIAGNOSIS — D631 Anemia in chronic kidney disease: Secondary | ICD-10-CM | POA: Diagnosis not present

## 2021-11-28 DIAGNOSIS — N186 End stage renal disease: Secondary | ICD-10-CM | POA: Diagnosis not present

## 2021-11-28 DIAGNOSIS — N2581 Secondary hyperparathyroidism of renal origin: Secondary | ICD-10-CM | POA: Diagnosis not present

## 2021-11-30 DIAGNOSIS — D689 Coagulation defect, unspecified: Secondary | ICD-10-CM | POA: Diagnosis not present

## 2021-11-30 DIAGNOSIS — N2581 Secondary hyperparathyroidism of renal origin: Secondary | ICD-10-CM | POA: Diagnosis not present

## 2021-11-30 DIAGNOSIS — D509 Iron deficiency anemia, unspecified: Secondary | ICD-10-CM | POA: Diagnosis not present

## 2021-11-30 DIAGNOSIS — D631 Anemia in chronic kidney disease: Secondary | ICD-10-CM | POA: Diagnosis not present

## 2021-11-30 DIAGNOSIS — Z992 Dependence on renal dialysis: Secondary | ICD-10-CM | POA: Diagnosis not present

## 2021-11-30 DIAGNOSIS — N186 End stage renal disease: Secondary | ICD-10-CM | POA: Diagnosis not present

## 2021-12-03 DIAGNOSIS — N2581 Secondary hyperparathyroidism of renal origin: Secondary | ICD-10-CM | POA: Diagnosis not present

## 2021-12-03 DIAGNOSIS — D689 Coagulation defect, unspecified: Secondary | ICD-10-CM | POA: Diagnosis not present

## 2021-12-03 DIAGNOSIS — D631 Anemia in chronic kidney disease: Secondary | ICD-10-CM | POA: Diagnosis not present

## 2021-12-03 DIAGNOSIS — N186 End stage renal disease: Secondary | ICD-10-CM | POA: Diagnosis not present

## 2021-12-03 DIAGNOSIS — D509 Iron deficiency anemia, unspecified: Secondary | ICD-10-CM | POA: Diagnosis not present

## 2021-12-03 DIAGNOSIS — Z992 Dependence on renal dialysis: Secondary | ICD-10-CM | POA: Diagnosis not present

## 2021-12-05 DIAGNOSIS — D689 Coagulation defect, unspecified: Secondary | ICD-10-CM | POA: Diagnosis not present

## 2021-12-05 DIAGNOSIS — Z992 Dependence on renal dialysis: Secondary | ICD-10-CM | POA: Diagnosis not present

## 2021-12-05 DIAGNOSIS — N2581 Secondary hyperparathyroidism of renal origin: Secondary | ICD-10-CM | POA: Diagnosis not present

## 2021-12-05 DIAGNOSIS — D509 Iron deficiency anemia, unspecified: Secondary | ICD-10-CM | POA: Diagnosis not present

## 2021-12-05 DIAGNOSIS — D631 Anemia in chronic kidney disease: Secondary | ICD-10-CM | POA: Diagnosis not present

## 2021-12-05 DIAGNOSIS — N186 End stage renal disease: Secondary | ICD-10-CM | POA: Diagnosis not present

## 2021-12-07 DIAGNOSIS — D689 Coagulation defect, unspecified: Secondary | ICD-10-CM | POA: Diagnosis not present

## 2021-12-07 DIAGNOSIS — D509 Iron deficiency anemia, unspecified: Secondary | ICD-10-CM | POA: Diagnosis not present

## 2021-12-07 DIAGNOSIS — N2581 Secondary hyperparathyroidism of renal origin: Secondary | ICD-10-CM | POA: Diagnosis not present

## 2021-12-07 DIAGNOSIS — D631 Anemia in chronic kidney disease: Secondary | ICD-10-CM | POA: Diagnosis not present

## 2021-12-07 DIAGNOSIS — N186 End stage renal disease: Secondary | ICD-10-CM | POA: Diagnosis not present

## 2021-12-07 DIAGNOSIS — Z992 Dependence on renal dialysis: Secondary | ICD-10-CM | POA: Diagnosis not present

## 2021-12-09 DIAGNOSIS — N184 Chronic kidney disease, stage 4 (severe): Secondary | ICD-10-CM | POA: Diagnosis not present

## 2021-12-09 DIAGNOSIS — I7389 Other specified peripheral vascular diseases: Secondary | ICD-10-CM | POA: Diagnosis not present

## 2021-12-10 DIAGNOSIS — D631 Anemia in chronic kidney disease: Secondary | ICD-10-CM | POA: Diagnosis not present

## 2021-12-10 DIAGNOSIS — N186 End stage renal disease: Secondary | ICD-10-CM | POA: Diagnosis not present

## 2021-12-10 DIAGNOSIS — Z992 Dependence on renal dialysis: Secondary | ICD-10-CM | POA: Diagnosis not present

## 2021-12-10 DIAGNOSIS — N2581 Secondary hyperparathyroidism of renal origin: Secondary | ICD-10-CM | POA: Diagnosis not present

## 2021-12-10 DIAGNOSIS — D689 Coagulation defect, unspecified: Secondary | ICD-10-CM | POA: Diagnosis not present

## 2021-12-12 DIAGNOSIS — D689 Coagulation defect, unspecified: Secondary | ICD-10-CM | POA: Diagnosis not present

## 2021-12-12 DIAGNOSIS — D631 Anemia in chronic kidney disease: Secondary | ICD-10-CM | POA: Diagnosis not present

## 2021-12-12 DIAGNOSIS — N2581 Secondary hyperparathyroidism of renal origin: Secondary | ICD-10-CM | POA: Diagnosis not present

## 2021-12-12 DIAGNOSIS — Z992 Dependence on renal dialysis: Secondary | ICD-10-CM | POA: Diagnosis not present

## 2021-12-12 DIAGNOSIS — N186 End stage renal disease: Secondary | ICD-10-CM | POA: Diagnosis not present

## 2021-12-14 DIAGNOSIS — D631 Anemia in chronic kidney disease: Secondary | ICD-10-CM | POA: Diagnosis not present

## 2021-12-14 DIAGNOSIS — N186 End stage renal disease: Secondary | ICD-10-CM | POA: Diagnosis not present

## 2021-12-14 DIAGNOSIS — D689 Coagulation defect, unspecified: Secondary | ICD-10-CM | POA: Diagnosis not present

## 2021-12-14 DIAGNOSIS — N2581 Secondary hyperparathyroidism of renal origin: Secondary | ICD-10-CM | POA: Diagnosis not present

## 2021-12-14 DIAGNOSIS — Z992 Dependence on renal dialysis: Secondary | ICD-10-CM | POA: Diagnosis not present

## 2021-12-15 DIAGNOSIS — I129 Hypertensive chronic kidney disease with stage 1 through stage 4 chronic kidney disease, or unspecified chronic kidney disease: Secondary | ICD-10-CM | POA: Diagnosis not present

## 2021-12-15 DIAGNOSIS — N186 End stage renal disease: Secondary | ICD-10-CM | POA: Diagnosis not present

## 2021-12-15 DIAGNOSIS — Z992 Dependence on renal dialysis: Secondary | ICD-10-CM | POA: Diagnosis not present

## 2021-12-15 DIAGNOSIS — E785 Hyperlipidemia, unspecified: Secondary | ICD-10-CM | POA: Diagnosis not present

## 2021-12-15 DIAGNOSIS — N185 Chronic kidney disease, stage 5: Secondary | ICD-10-CM | POA: Diagnosis not present

## 2021-12-17 DIAGNOSIS — N186 End stage renal disease: Secondary | ICD-10-CM | POA: Diagnosis not present

## 2021-12-17 DIAGNOSIS — N2581 Secondary hyperparathyroidism of renal origin: Secondary | ICD-10-CM | POA: Diagnosis not present

## 2021-12-17 DIAGNOSIS — Z992 Dependence on renal dialysis: Secondary | ICD-10-CM | POA: Diagnosis not present

## 2021-12-19 DIAGNOSIS — N186 End stage renal disease: Secondary | ICD-10-CM | POA: Diagnosis not present

## 2021-12-19 DIAGNOSIS — N2581 Secondary hyperparathyroidism of renal origin: Secondary | ICD-10-CM | POA: Diagnosis not present

## 2021-12-19 DIAGNOSIS — Z992 Dependence on renal dialysis: Secondary | ICD-10-CM | POA: Diagnosis not present

## 2021-12-21 DIAGNOSIS — Z992 Dependence on renal dialysis: Secondary | ICD-10-CM | POA: Diagnosis not present

## 2021-12-21 DIAGNOSIS — N186 End stage renal disease: Secondary | ICD-10-CM | POA: Diagnosis not present

## 2021-12-21 DIAGNOSIS — N2581 Secondary hyperparathyroidism of renal origin: Secondary | ICD-10-CM | POA: Diagnosis not present

## 2021-12-23 DIAGNOSIS — Z992 Dependence on renal dialysis: Secondary | ICD-10-CM | POA: Diagnosis not present

## 2021-12-23 DIAGNOSIS — I2510 Atherosclerotic heart disease of native coronary artery without angina pectoris: Secondary | ICD-10-CM | POA: Diagnosis not present

## 2021-12-23 DIAGNOSIS — I71019 Dissection of thoracic aorta, unspecified: Secondary | ICD-10-CM | POA: Diagnosis not present

## 2021-12-23 DIAGNOSIS — N186 End stage renal disease: Secondary | ICD-10-CM | POA: Diagnosis not present

## 2021-12-23 DIAGNOSIS — I120 Hypertensive chronic kidney disease with stage 5 chronic kidney disease or end stage renal disease: Secondary | ICD-10-CM | POA: Diagnosis not present

## 2021-12-23 DIAGNOSIS — E785 Hyperlipidemia, unspecified: Secondary | ICD-10-CM | POA: Diagnosis not present

## 2021-12-23 DIAGNOSIS — R001 Bradycardia, unspecified: Secondary | ICD-10-CM | POA: Diagnosis not present

## 2021-12-23 DIAGNOSIS — Z87891 Personal history of nicotine dependence: Secondary | ICD-10-CM | POA: Diagnosis not present

## 2021-12-24 DIAGNOSIS — N186 End stage renal disease: Secondary | ICD-10-CM | POA: Diagnosis not present

## 2021-12-24 DIAGNOSIS — N2581 Secondary hyperparathyroidism of renal origin: Secondary | ICD-10-CM | POA: Diagnosis not present

## 2021-12-24 DIAGNOSIS — Z992 Dependence on renal dialysis: Secondary | ICD-10-CM | POA: Diagnosis not present

## 2021-12-26 DIAGNOSIS — N186 End stage renal disease: Secondary | ICD-10-CM | POA: Diagnosis not present

## 2021-12-26 DIAGNOSIS — R635 Abnormal weight gain: Secondary | ICD-10-CM | POA: Diagnosis not present

## 2021-12-26 DIAGNOSIS — I120 Hypertensive chronic kidney disease with stage 5 chronic kidney disease or end stage renal disease: Secondary | ICD-10-CM | POA: Diagnosis not present

## 2021-12-26 DIAGNOSIS — N2581 Secondary hyperparathyroidism of renal origin: Secondary | ICD-10-CM | POA: Diagnosis not present

## 2021-12-26 DIAGNOSIS — Z713 Dietary counseling and surveillance: Secondary | ICD-10-CM | POA: Diagnosis not present

## 2021-12-26 DIAGNOSIS — Z992 Dependence on renal dialysis: Secondary | ICD-10-CM | POA: Diagnosis not present

## 2021-12-27 ENCOUNTER — Ambulatory Visit
Admission: RE | Admit: 2021-12-27 | Discharge: 2021-12-27 | Disposition: A | Discharge status: Home / Self Care | Payer: Medicare HMO | Source: Ambulatory Visit | Attending: Family Medicine | Admitting: Family Medicine

## 2021-12-27 ENCOUNTER — Other Ambulatory Visit: Payer: Self-pay | Admitting: Family Medicine

## 2021-12-27 DIAGNOSIS — M25561 Pain in right knee: Secondary | ICD-10-CM

## 2021-12-27 DIAGNOSIS — M25562 Pain in left knee: Secondary | ICD-10-CM | POA: Diagnosis not present

## 2021-12-28 DIAGNOSIS — Z992 Dependence on renal dialysis: Secondary | ICD-10-CM | POA: Diagnosis not present

## 2021-12-28 DIAGNOSIS — N2581 Secondary hyperparathyroidism of renal origin: Secondary | ICD-10-CM | POA: Diagnosis not present

## 2021-12-28 DIAGNOSIS — N186 End stage renal disease: Secondary | ICD-10-CM | POA: Diagnosis not present

## 2021-12-31 DIAGNOSIS — Z992 Dependence on renal dialysis: Secondary | ICD-10-CM | POA: Diagnosis not present

## 2021-12-31 DIAGNOSIS — N186 End stage renal disease: Secondary | ICD-10-CM | POA: Diagnosis not present

## 2021-12-31 DIAGNOSIS — N2581 Secondary hyperparathyroidism of renal origin: Secondary | ICD-10-CM | POA: Diagnosis not present

## 2022-01-02 DIAGNOSIS — Z992 Dependence on renal dialysis: Secondary | ICD-10-CM | POA: Diagnosis not present

## 2022-01-02 DIAGNOSIS — N2581 Secondary hyperparathyroidism of renal origin: Secondary | ICD-10-CM | POA: Diagnosis not present

## 2022-01-02 DIAGNOSIS — N186 End stage renal disease: Secondary | ICD-10-CM | POA: Diagnosis not present

## 2022-01-04 DIAGNOSIS — N2581 Secondary hyperparathyroidism of renal origin: Secondary | ICD-10-CM | POA: Diagnosis not present

## 2022-01-04 DIAGNOSIS — Z992 Dependence on renal dialysis: Secondary | ICD-10-CM | POA: Diagnosis not present

## 2022-01-04 DIAGNOSIS — N186 End stage renal disease: Secondary | ICD-10-CM | POA: Diagnosis not present

## 2022-01-07 DIAGNOSIS — N2581 Secondary hyperparathyroidism of renal origin: Secondary | ICD-10-CM | POA: Diagnosis not present

## 2022-01-07 DIAGNOSIS — N186 End stage renal disease: Secondary | ICD-10-CM | POA: Diagnosis not present

## 2022-01-07 DIAGNOSIS — Z992 Dependence on renal dialysis: Secondary | ICD-10-CM | POA: Diagnosis not present

## 2022-01-09 DIAGNOSIS — N2581 Secondary hyperparathyroidism of renal origin: Secondary | ICD-10-CM | POA: Diagnosis not present

## 2022-01-09 DIAGNOSIS — Z992 Dependence on renal dialysis: Secondary | ICD-10-CM | POA: Diagnosis not present

## 2022-01-09 DIAGNOSIS — N186 End stage renal disease: Secondary | ICD-10-CM | POA: Diagnosis not present

## 2022-01-10 DIAGNOSIS — Z992 Dependence on renal dialysis: Secondary | ICD-10-CM | POA: Diagnosis not present

## 2022-01-10 DIAGNOSIS — N186 End stage renal disease: Secondary | ICD-10-CM | POA: Diagnosis not present

## 2022-01-10 DIAGNOSIS — I9581 Postprocedural hypotension: Secondary | ICD-10-CM | POA: Diagnosis not present

## 2022-01-11 DIAGNOSIS — N2581 Secondary hyperparathyroidism of renal origin: Secondary | ICD-10-CM | POA: Diagnosis not present

## 2022-01-11 DIAGNOSIS — N186 End stage renal disease: Secondary | ICD-10-CM | POA: Diagnosis not present

## 2022-01-11 DIAGNOSIS — Z992 Dependence on renal dialysis: Secondary | ICD-10-CM | POA: Diagnosis not present

## 2022-01-14 DIAGNOSIS — Z992 Dependence on renal dialysis: Secondary | ICD-10-CM | POA: Diagnosis not present

## 2022-01-14 DIAGNOSIS — N186 End stage renal disease: Secondary | ICD-10-CM | POA: Diagnosis not present

## 2022-01-14 DIAGNOSIS — N2581 Secondary hyperparathyroidism of renal origin: Secondary | ICD-10-CM | POA: Diagnosis not present

## 2022-01-15 DIAGNOSIS — I120 Hypertensive chronic kidney disease with stage 5 chronic kidney disease or end stage renal disease: Secondary | ICD-10-CM | POA: Diagnosis not present

## 2022-01-15 DIAGNOSIS — N186 End stage renal disease: Secondary | ICD-10-CM | POA: Diagnosis not present

## 2022-01-15 DIAGNOSIS — N185 Chronic kidney disease, stage 5: Secondary | ICD-10-CM | POA: Diagnosis not present

## 2022-01-15 DIAGNOSIS — I129 Hypertensive chronic kidney disease with stage 1 through stage 4 chronic kidney disease, or unspecified chronic kidney disease: Secondary | ICD-10-CM | POA: Diagnosis not present

## 2022-01-15 DIAGNOSIS — Z992 Dependence on renal dialysis: Secondary | ICD-10-CM | POA: Diagnosis not present

## 2022-01-16 DIAGNOSIS — N2581 Secondary hyperparathyroidism of renal origin: Secondary | ICD-10-CM | POA: Diagnosis not present

## 2022-01-16 DIAGNOSIS — Z992 Dependence on renal dialysis: Secondary | ICD-10-CM | POA: Diagnosis not present

## 2022-01-16 DIAGNOSIS — N186 End stage renal disease: Secondary | ICD-10-CM | POA: Diagnosis not present

## 2022-01-18 DIAGNOSIS — N2581 Secondary hyperparathyroidism of renal origin: Secondary | ICD-10-CM | POA: Diagnosis not present

## 2022-01-18 DIAGNOSIS — N186 End stage renal disease: Secondary | ICD-10-CM | POA: Diagnosis not present

## 2022-01-18 DIAGNOSIS — Z992 Dependence on renal dialysis: Secondary | ICD-10-CM | POA: Diagnosis not present

## 2022-01-21 DIAGNOSIS — N186 End stage renal disease: Secondary | ICD-10-CM | POA: Diagnosis not present

## 2022-01-21 DIAGNOSIS — N2581 Secondary hyperparathyroidism of renal origin: Secondary | ICD-10-CM | POA: Diagnosis not present

## 2022-01-21 DIAGNOSIS — Z992 Dependence on renal dialysis: Secondary | ICD-10-CM | POA: Diagnosis not present

## 2022-01-23 DIAGNOSIS — N2581 Secondary hyperparathyroidism of renal origin: Secondary | ICD-10-CM | POA: Diagnosis not present

## 2022-01-23 DIAGNOSIS — Z992 Dependence on renal dialysis: Secondary | ICD-10-CM | POA: Diagnosis not present

## 2022-01-23 DIAGNOSIS — N186 End stage renal disease: Secondary | ICD-10-CM | POA: Diagnosis not present

## 2022-01-25 DIAGNOSIS — Z992 Dependence on renal dialysis: Secondary | ICD-10-CM | POA: Diagnosis not present

## 2022-01-25 DIAGNOSIS — N2581 Secondary hyperparathyroidism of renal origin: Secondary | ICD-10-CM | POA: Diagnosis not present

## 2022-01-25 DIAGNOSIS — N186 End stage renal disease: Secondary | ICD-10-CM | POA: Diagnosis not present

## 2022-01-28 DIAGNOSIS — N2581 Secondary hyperparathyroidism of renal origin: Secondary | ICD-10-CM | POA: Diagnosis not present

## 2022-01-28 DIAGNOSIS — N186 End stage renal disease: Secondary | ICD-10-CM | POA: Diagnosis not present

## 2022-01-28 DIAGNOSIS — Z992 Dependence on renal dialysis: Secondary | ICD-10-CM | POA: Diagnosis not present

## 2022-01-30 DIAGNOSIS — N186 End stage renal disease: Secondary | ICD-10-CM | POA: Diagnosis not present

## 2022-01-30 DIAGNOSIS — N2581 Secondary hyperparathyroidism of renal origin: Secondary | ICD-10-CM | POA: Diagnosis not present

## 2022-01-30 DIAGNOSIS — Z992 Dependence on renal dialysis: Secondary | ICD-10-CM | POA: Diagnosis not present

## 2022-01-31 DIAGNOSIS — H25811 Combined forms of age-related cataract, right eye: Secondary | ICD-10-CM | POA: Diagnosis not present

## 2022-02-01 DIAGNOSIS — N186 End stage renal disease: Secondary | ICD-10-CM | POA: Diagnosis not present

## 2022-02-01 DIAGNOSIS — N2581 Secondary hyperparathyroidism of renal origin: Secondary | ICD-10-CM | POA: Diagnosis not present

## 2022-02-01 DIAGNOSIS — Z992 Dependence on renal dialysis: Secondary | ICD-10-CM | POA: Diagnosis not present

## 2022-02-04 DIAGNOSIS — N186 End stage renal disease: Secondary | ICD-10-CM | POA: Diagnosis not present

## 2022-02-04 DIAGNOSIS — Z992 Dependence on renal dialysis: Secondary | ICD-10-CM | POA: Diagnosis not present

## 2022-02-04 DIAGNOSIS — N2581 Secondary hyperparathyroidism of renal origin: Secondary | ICD-10-CM | POA: Diagnosis not present

## 2022-02-06 DIAGNOSIS — N2581 Secondary hyperparathyroidism of renal origin: Secondary | ICD-10-CM | POA: Diagnosis not present

## 2022-02-06 DIAGNOSIS — N186 End stage renal disease: Secondary | ICD-10-CM | POA: Diagnosis not present

## 2022-02-06 DIAGNOSIS — Z992 Dependence on renal dialysis: Secondary | ICD-10-CM | POA: Diagnosis not present

## 2022-02-08 DIAGNOSIS — N2581 Secondary hyperparathyroidism of renal origin: Secondary | ICD-10-CM | POA: Diagnosis not present

## 2022-02-08 DIAGNOSIS — Z992 Dependence on renal dialysis: Secondary | ICD-10-CM | POA: Diagnosis not present

## 2022-02-08 DIAGNOSIS — N186 End stage renal disease: Secondary | ICD-10-CM | POA: Diagnosis not present

## 2022-02-11 DIAGNOSIS — N186 End stage renal disease: Secondary | ICD-10-CM | POA: Diagnosis not present

## 2022-02-11 DIAGNOSIS — N2581 Secondary hyperparathyroidism of renal origin: Secondary | ICD-10-CM | POA: Diagnosis not present

## 2022-02-11 DIAGNOSIS — Z992 Dependence on renal dialysis: Secondary | ICD-10-CM | POA: Diagnosis not present

## 2022-02-13 DIAGNOSIS — N2581 Secondary hyperparathyroidism of renal origin: Secondary | ICD-10-CM | POA: Diagnosis not present

## 2022-02-13 DIAGNOSIS — Z992 Dependence on renal dialysis: Secondary | ICD-10-CM | POA: Diagnosis not present

## 2022-02-13 DIAGNOSIS — N186 End stage renal disease: Secondary | ICD-10-CM | POA: Diagnosis not present

## 2022-02-14 DIAGNOSIS — Z992 Dependence on renal dialysis: Secondary | ICD-10-CM | POA: Diagnosis not present

## 2022-02-14 DIAGNOSIS — I129 Hypertensive chronic kidney disease with stage 1 through stage 4 chronic kidney disease, or unspecified chronic kidney disease: Secondary | ICD-10-CM | POA: Diagnosis not present

## 2022-02-14 DIAGNOSIS — N186 End stage renal disease: Secondary | ICD-10-CM | POA: Diagnosis not present

## 2022-02-14 DIAGNOSIS — E785 Hyperlipidemia, unspecified: Secondary | ICD-10-CM | POA: Diagnosis not present

## 2022-02-14 DIAGNOSIS — I120 Hypertensive chronic kidney disease with stage 5 chronic kidney disease or end stage renal disease: Secondary | ICD-10-CM | POA: Diagnosis not present

## 2022-02-15 DIAGNOSIS — Z992 Dependence on renal dialysis: Secondary | ICD-10-CM | POA: Diagnosis not present

## 2022-02-15 DIAGNOSIS — N2581 Secondary hyperparathyroidism of renal origin: Secondary | ICD-10-CM | POA: Diagnosis not present

## 2022-02-15 DIAGNOSIS — N186 End stage renal disease: Secondary | ICD-10-CM | POA: Diagnosis not present

## 2022-02-18 DIAGNOSIS — N2581 Secondary hyperparathyroidism of renal origin: Secondary | ICD-10-CM | POA: Diagnosis not present

## 2022-02-18 DIAGNOSIS — N186 End stage renal disease: Secondary | ICD-10-CM | POA: Diagnosis not present

## 2022-02-18 DIAGNOSIS — Z992 Dependence on renal dialysis: Secondary | ICD-10-CM | POA: Diagnosis not present

## 2022-02-20 DIAGNOSIS — N2581 Secondary hyperparathyroidism of renal origin: Secondary | ICD-10-CM | POA: Diagnosis not present

## 2022-02-20 DIAGNOSIS — Z992 Dependence on renal dialysis: Secondary | ICD-10-CM | POA: Diagnosis not present

## 2022-02-20 DIAGNOSIS — N186 End stage renal disease: Secondary | ICD-10-CM | POA: Diagnosis not present

## 2022-02-22 DIAGNOSIS — N2581 Secondary hyperparathyroidism of renal origin: Secondary | ICD-10-CM | POA: Diagnosis not present

## 2022-02-22 DIAGNOSIS — N186 End stage renal disease: Secondary | ICD-10-CM | POA: Diagnosis not present

## 2022-02-22 DIAGNOSIS — Z992 Dependence on renal dialysis: Secondary | ICD-10-CM | POA: Diagnosis not present

## 2022-02-25 DIAGNOSIS — Z992 Dependence on renal dialysis: Secondary | ICD-10-CM | POA: Diagnosis not present

## 2022-02-25 DIAGNOSIS — N2581 Secondary hyperparathyroidism of renal origin: Secondary | ICD-10-CM | POA: Diagnosis not present

## 2022-02-25 DIAGNOSIS — N186 End stage renal disease: Secondary | ICD-10-CM | POA: Diagnosis not present

## 2022-02-26 DIAGNOSIS — I1311 Hypertensive heart and chronic kidney disease without heart failure, with stage 5 chronic kidney disease, or end stage renal disease: Secondary | ICD-10-CM | POA: Diagnosis not present

## 2022-02-26 DIAGNOSIS — N186 End stage renal disease: Secondary | ICD-10-CM | POA: Diagnosis not present

## 2022-02-26 DIAGNOSIS — Z992 Dependence on renal dialysis: Secondary | ICD-10-CM | POA: Diagnosis not present

## 2022-02-26 DIAGNOSIS — E669 Obesity, unspecified: Secondary | ICD-10-CM | POA: Diagnosis not present

## 2022-02-26 DIAGNOSIS — G4733 Obstructive sleep apnea (adult) (pediatric): Secondary | ICD-10-CM | POA: Diagnosis not present

## 2022-02-26 DIAGNOSIS — E785 Hyperlipidemia, unspecified: Secondary | ICD-10-CM | POA: Diagnosis not present

## 2022-02-26 DIAGNOSIS — Z87891 Personal history of nicotine dependence: Secondary | ICD-10-CM | POA: Diagnosis not present

## 2022-02-26 DIAGNOSIS — Z6833 Body mass index (BMI) 33.0-33.9, adult: Secondary | ICD-10-CM | POA: Diagnosis not present

## 2022-02-27 DIAGNOSIS — Z992 Dependence on renal dialysis: Secondary | ICD-10-CM | POA: Diagnosis not present

## 2022-02-27 DIAGNOSIS — N2581 Secondary hyperparathyroidism of renal origin: Secondary | ICD-10-CM | POA: Diagnosis not present

## 2022-02-27 DIAGNOSIS — N186 End stage renal disease: Secondary | ICD-10-CM | POA: Diagnosis not present

## 2022-03-01 DIAGNOSIS — N186 End stage renal disease: Secondary | ICD-10-CM | POA: Diagnosis not present

## 2022-03-01 DIAGNOSIS — N2581 Secondary hyperparathyroidism of renal origin: Secondary | ICD-10-CM | POA: Diagnosis not present

## 2022-03-01 DIAGNOSIS — Z992 Dependence on renal dialysis: Secondary | ICD-10-CM | POA: Diagnosis not present

## 2022-03-04 DIAGNOSIS — N2581 Secondary hyperparathyroidism of renal origin: Secondary | ICD-10-CM | POA: Diagnosis not present

## 2022-03-04 DIAGNOSIS — N186 End stage renal disease: Secondary | ICD-10-CM | POA: Diagnosis not present

## 2022-03-04 DIAGNOSIS — Z992 Dependence on renal dialysis: Secondary | ICD-10-CM | POA: Diagnosis not present

## 2022-03-06 DIAGNOSIS — Z992 Dependence on renal dialysis: Secondary | ICD-10-CM | POA: Diagnosis not present

## 2022-03-06 DIAGNOSIS — N186 End stage renal disease: Secondary | ICD-10-CM | POA: Diagnosis not present

## 2022-03-06 DIAGNOSIS — N2581 Secondary hyperparathyroidism of renal origin: Secondary | ICD-10-CM | POA: Diagnosis not present

## 2022-03-08 DIAGNOSIS — N186 End stage renal disease: Secondary | ICD-10-CM | POA: Diagnosis not present

## 2022-03-08 DIAGNOSIS — N2581 Secondary hyperparathyroidism of renal origin: Secondary | ICD-10-CM | POA: Diagnosis not present

## 2022-03-08 DIAGNOSIS — Z992 Dependence on renal dialysis: Secondary | ICD-10-CM | POA: Diagnosis not present

## 2022-03-11 DIAGNOSIS — Z992 Dependence on renal dialysis: Secondary | ICD-10-CM | POA: Diagnosis not present

## 2022-03-11 DIAGNOSIS — N186 End stage renal disease: Secondary | ICD-10-CM | POA: Diagnosis not present

## 2022-03-11 DIAGNOSIS — N2581 Secondary hyperparathyroidism of renal origin: Secondary | ICD-10-CM | POA: Diagnosis not present

## 2022-03-13 DIAGNOSIS — Z992 Dependence on renal dialysis: Secondary | ICD-10-CM | POA: Diagnosis not present

## 2022-03-13 DIAGNOSIS — N2581 Secondary hyperparathyroidism of renal origin: Secondary | ICD-10-CM | POA: Diagnosis not present

## 2022-03-13 DIAGNOSIS — N186 End stage renal disease: Secondary | ICD-10-CM | POA: Diagnosis not present

## 2022-03-15 DIAGNOSIS — N2581 Secondary hyperparathyroidism of renal origin: Secondary | ICD-10-CM | POA: Diagnosis not present

## 2022-03-15 DIAGNOSIS — Z992 Dependence on renal dialysis: Secondary | ICD-10-CM | POA: Diagnosis not present

## 2022-03-15 DIAGNOSIS — N186 End stage renal disease: Secondary | ICD-10-CM | POA: Diagnosis not present

## 2022-03-17 DIAGNOSIS — N186 End stage renal disease: Secondary | ICD-10-CM | POA: Diagnosis not present

## 2022-03-17 DIAGNOSIS — Z992 Dependence on renal dialysis: Secondary | ICD-10-CM | POA: Diagnosis not present

## 2022-03-17 DIAGNOSIS — E785 Hyperlipidemia, unspecified: Secondary | ICD-10-CM | POA: Diagnosis not present

## 2022-03-17 DIAGNOSIS — N185 Chronic kidney disease, stage 5: Secondary | ICD-10-CM | POA: Diagnosis not present

## 2022-03-17 DIAGNOSIS — I120 Hypertensive chronic kidney disease with stage 5 chronic kidney disease or end stage renal disease: Secondary | ICD-10-CM | POA: Diagnosis not present

## 2022-03-17 DIAGNOSIS — I129 Hypertensive chronic kidney disease with stage 1 through stage 4 chronic kidney disease, or unspecified chronic kidney disease: Secondary | ICD-10-CM | POA: Diagnosis not present

## 2022-03-18 DIAGNOSIS — N2581 Secondary hyperparathyroidism of renal origin: Secondary | ICD-10-CM | POA: Diagnosis not present

## 2022-03-18 DIAGNOSIS — Z992 Dependence on renal dialysis: Secondary | ICD-10-CM | POA: Diagnosis not present

## 2022-03-18 DIAGNOSIS — N186 End stage renal disease: Secondary | ICD-10-CM | POA: Diagnosis not present

## 2022-03-20 DIAGNOSIS — N186 End stage renal disease: Secondary | ICD-10-CM | POA: Diagnosis not present

## 2022-03-20 DIAGNOSIS — N2581 Secondary hyperparathyroidism of renal origin: Secondary | ICD-10-CM | POA: Diagnosis not present

## 2022-03-20 DIAGNOSIS — Z992 Dependence on renal dialysis: Secondary | ICD-10-CM | POA: Diagnosis not present

## 2022-03-22 DIAGNOSIS — N2581 Secondary hyperparathyroidism of renal origin: Secondary | ICD-10-CM | POA: Diagnosis not present

## 2022-03-22 DIAGNOSIS — N186 End stage renal disease: Secondary | ICD-10-CM | POA: Diagnosis not present

## 2022-03-22 DIAGNOSIS — Z992 Dependence on renal dialysis: Secondary | ICD-10-CM | POA: Diagnosis not present

## 2022-03-25 DIAGNOSIS — Z992 Dependence on renal dialysis: Secondary | ICD-10-CM | POA: Diagnosis not present

## 2022-03-25 DIAGNOSIS — N186 End stage renal disease: Secondary | ICD-10-CM | POA: Diagnosis not present

## 2022-03-25 DIAGNOSIS — N2581 Secondary hyperparathyroidism of renal origin: Secondary | ICD-10-CM | POA: Diagnosis not present

## 2022-03-26 ENCOUNTER — Ambulatory Visit (INDEPENDENT_AMBULATORY_CARE_PROVIDER_SITE_OTHER): Payer: Self-pay | Admitting: Podiatry

## 2022-03-26 DIAGNOSIS — H2512 Age-related nuclear cataract, left eye: Secondary | ICD-10-CM | POA: Diagnosis not present

## 2022-03-26 DIAGNOSIS — Z91199 Patient's noncompliance with other medical treatment and regimen due to unspecified reason: Secondary | ICD-10-CM

## 2022-03-26 NOTE — Progress Notes (Signed)
No show

## 2022-03-27 DIAGNOSIS — N2581 Secondary hyperparathyroidism of renal origin: Secondary | ICD-10-CM | POA: Diagnosis not present

## 2022-03-27 DIAGNOSIS — Z992 Dependence on renal dialysis: Secondary | ICD-10-CM | POA: Diagnosis not present

## 2022-03-27 DIAGNOSIS — N186 End stage renal disease: Secondary | ICD-10-CM | POA: Diagnosis not present

## 2022-03-28 DIAGNOSIS — H25812 Combined forms of age-related cataract, left eye: Secondary | ICD-10-CM | POA: Diagnosis not present

## 2022-03-29 DIAGNOSIS — N186 End stage renal disease: Secondary | ICD-10-CM | POA: Diagnosis not present

## 2022-03-29 DIAGNOSIS — N2581 Secondary hyperparathyroidism of renal origin: Secondary | ICD-10-CM | POA: Diagnosis not present

## 2022-03-29 DIAGNOSIS — Z992 Dependence on renal dialysis: Secondary | ICD-10-CM | POA: Diagnosis not present

## 2022-04-01 DIAGNOSIS — N186 End stage renal disease: Secondary | ICD-10-CM | POA: Diagnosis not present

## 2022-04-01 DIAGNOSIS — N2581 Secondary hyperparathyroidism of renal origin: Secondary | ICD-10-CM | POA: Diagnosis not present

## 2022-04-01 DIAGNOSIS — Z992 Dependence on renal dialysis: Secondary | ICD-10-CM | POA: Diagnosis not present

## 2022-04-03 DIAGNOSIS — Z992 Dependence on renal dialysis: Secondary | ICD-10-CM | POA: Diagnosis not present

## 2022-04-03 DIAGNOSIS — N2581 Secondary hyperparathyroidism of renal origin: Secondary | ICD-10-CM | POA: Diagnosis not present

## 2022-04-03 DIAGNOSIS — N186 End stage renal disease: Secondary | ICD-10-CM | POA: Diagnosis not present

## 2022-04-05 DIAGNOSIS — N2581 Secondary hyperparathyroidism of renal origin: Secondary | ICD-10-CM | POA: Diagnosis not present

## 2022-04-05 DIAGNOSIS — Z992 Dependence on renal dialysis: Secondary | ICD-10-CM | POA: Diagnosis not present

## 2022-04-05 DIAGNOSIS — N186 End stage renal disease: Secondary | ICD-10-CM | POA: Diagnosis not present

## 2022-04-07 DIAGNOSIS — N2581 Secondary hyperparathyroidism of renal origin: Secondary | ICD-10-CM | POA: Diagnosis not present

## 2022-04-07 DIAGNOSIS — N186 End stage renal disease: Secondary | ICD-10-CM | POA: Diagnosis not present

## 2022-04-07 DIAGNOSIS — Z992 Dependence on renal dialysis: Secondary | ICD-10-CM | POA: Diagnosis not present

## 2022-04-08 DIAGNOSIS — Z992 Dependence on renal dialysis: Secondary | ICD-10-CM | POA: Diagnosis not present

## 2022-04-08 DIAGNOSIS — N2581 Secondary hyperparathyroidism of renal origin: Secondary | ICD-10-CM | POA: Diagnosis not present

## 2022-04-08 DIAGNOSIS — N186 End stage renal disease: Secondary | ICD-10-CM | POA: Diagnosis not present

## 2022-04-09 DIAGNOSIS — N185 Chronic kidney disease, stage 5: Secondary | ICD-10-CM | POA: Diagnosis not present

## 2022-04-09 DIAGNOSIS — E782 Mixed hyperlipidemia: Secondary | ICD-10-CM | POA: Diagnosis not present

## 2022-04-10 DIAGNOSIS — N186 End stage renal disease: Secondary | ICD-10-CM | POA: Diagnosis not present

## 2022-04-10 DIAGNOSIS — N2581 Secondary hyperparathyroidism of renal origin: Secondary | ICD-10-CM | POA: Diagnosis not present

## 2022-04-10 DIAGNOSIS — Z992 Dependence on renal dialysis: Secondary | ICD-10-CM | POA: Diagnosis not present

## 2022-04-15 DIAGNOSIS — N2581 Secondary hyperparathyroidism of renal origin: Secondary | ICD-10-CM | POA: Diagnosis not present

## 2022-04-15 DIAGNOSIS — Z992 Dependence on renal dialysis: Secondary | ICD-10-CM | POA: Diagnosis not present

## 2022-04-15 DIAGNOSIS — N186 End stage renal disease: Secondary | ICD-10-CM | POA: Diagnosis not present

## 2022-04-17 DIAGNOSIS — N2581 Secondary hyperparathyroidism of renal origin: Secondary | ICD-10-CM | POA: Diagnosis not present

## 2022-04-17 DIAGNOSIS — Z720 Tobacco use: Secondary | ICD-10-CM | POA: Diagnosis not present

## 2022-04-17 DIAGNOSIS — Z992 Dependence on renal dialysis: Secondary | ICD-10-CM | POA: Diagnosis not present

## 2022-04-17 DIAGNOSIS — I129 Hypertensive chronic kidney disease with stage 1 through stage 4 chronic kidney disease, or unspecified chronic kidney disease: Secondary | ICD-10-CM | POA: Diagnosis not present

## 2022-04-17 DIAGNOSIS — N185 Chronic kidney disease, stage 5: Secondary | ICD-10-CM | POA: Diagnosis not present

## 2022-04-17 DIAGNOSIS — I120 Hypertensive chronic kidney disease with stage 5 chronic kidney disease or end stage renal disease: Secondary | ICD-10-CM | POA: Diagnosis not present

## 2022-04-17 DIAGNOSIS — N186 End stage renal disease: Secondary | ICD-10-CM | POA: Diagnosis not present

## 2022-04-19 DIAGNOSIS — N2581 Secondary hyperparathyroidism of renal origin: Secondary | ICD-10-CM | POA: Diagnosis not present

## 2022-04-19 DIAGNOSIS — N186 End stage renal disease: Secondary | ICD-10-CM | POA: Diagnosis not present

## 2022-04-19 DIAGNOSIS — Z992 Dependence on renal dialysis: Secondary | ICD-10-CM | POA: Diagnosis not present

## 2022-04-22 DIAGNOSIS — N2581 Secondary hyperparathyroidism of renal origin: Secondary | ICD-10-CM | POA: Diagnosis not present

## 2022-04-22 DIAGNOSIS — N186 End stage renal disease: Secondary | ICD-10-CM | POA: Diagnosis not present

## 2022-04-22 DIAGNOSIS — Z992 Dependence on renal dialysis: Secondary | ICD-10-CM | POA: Diagnosis not present

## 2022-04-24 DIAGNOSIS — Z992 Dependence on renal dialysis: Secondary | ICD-10-CM | POA: Diagnosis not present

## 2022-04-24 DIAGNOSIS — N2581 Secondary hyperparathyroidism of renal origin: Secondary | ICD-10-CM | POA: Diagnosis not present

## 2022-04-24 DIAGNOSIS — N186 End stage renal disease: Secondary | ICD-10-CM | POA: Diagnosis not present

## 2022-04-26 DIAGNOSIS — N186 End stage renal disease: Secondary | ICD-10-CM | POA: Diagnosis not present

## 2022-04-26 DIAGNOSIS — Z992 Dependence on renal dialysis: Secondary | ICD-10-CM | POA: Diagnosis not present

## 2022-04-26 DIAGNOSIS — N2581 Secondary hyperparathyroidism of renal origin: Secondary | ICD-10-CM | POA: Diagnosis not present

## 2022-04-29 DIAGNOSIS — N186 End stage renal disease: Secondary | ICD-10-CM | POA: Diagnosis not present

## 2022-04-29 DIAGNOSIS — Z992 Dependence on renal dialysis: Secondary | ICD-10-CM | POA: Diagnosis not present

## 2022-04-29 DIAGNOSIS — N2581 Secondary hyperparathyroidism of renal origin: Secondary | ICD-10-CM | POA: Diagnosis not present

## 2022-05-01 DIAGNOSIS — N186 End stage renal disease: Secondary | ICD-10-CM | POA: Diagnosis not present

## 2022-05-01 DIAGNOSIS — N2581 Secondary hyperparathyroidism of renal origin: Secondary | ICD-10-CM | POA: Diagnosis not present

## 2022-05-01 DIAGNOSIS — Z992 Dependence on renal dialysis: Secondary | ICD-10-CM | POA: Diagnosis not present

## 2022-05-03 DIAGNOSIS — N186 End stage renal disease: Secondary | ICD-10-CM | POA: Diagnosis not present

## 2022-05-03 DIAGNOSIS — Z992 Dependence on renal dialysis: Secondary | ICD-10-CM | POA: Diagnosis not present

## 2022-05-03 DIAGNOSIS — N2581 Secondary hyperparathyroidism of renal origin: Secondary | ICD-10-CM | POA: Diagnosis not present

## 2022-05-05 DIAGNOSIS — N186 End stage renal disease: Secondary | ICD-10-CM | POA: Diagnosis not present

## 2022-05-05 DIAGNOSIS — Z992 Dependence on renal dialysis: Secondary | ICD-10-CM | POA: Diagnosis not present

## 2022-05-05 DIAGNOSIS — I871 Compression of vein: Secondary | ICD-10-CM | POA: Diagnosis not present

## 2022-05-06 DIAGNOSIS — Z992 Dependence on renal dialysis: Secondary | ICD-10-CM | POA: Diagnosis not present

## 2022-05-06 DIAGNOSIS — N2581 Secondary hyperparathyroidism of renal origin: Secondary | ICD-10-CM | POA: Diagnosis not present

## 2022-05-06 DIAGNOSIS — N186 End stage renal disease: Secondary | ICD-10-CM | POA: Diagnosis not present

## 2022-05-08 DIAGNOSIS — N2581 Secondary hyperparathyroidism of renal origin: Secondary | ICD-10-CM | POA: Diagnosis not present

## 2022-05-08 DIAGNOSIS — N186 End stage renal disease: Secondary | ICD-10-CM | POA: Diagnosis not present

## 2022-05-08 DIAGNOSIS — Z992 Dependence on renal dialysis: Secondary | ICD-10-CM | POA: Diagnosis not present

## 2022-05-10 DIAGNOSIS — N186 End stage renal disease: Secondary | ICD-10-CM | POA: Diagnosis not present

## 2022-05-10 DIAGNOSIS — Z992 Dependence on renal dialysis: Secondary | ICD-10-CM | POA: Diagnosis not present

## 2022-05-10 DIAGNOSIS — N2581 Secondary hyperparathyroidism of renal origin: Secondary | ICD-10-CM | POA: Diagnosis not present

## 2022-05-13 DIAGNOSIS — Z992 Dependence on renal dialysis: Secondary | ICD-10-CM | POA: Diagnosis not present

## 2022-05-13 DIAGNOSIS — N186 End stage renal disease: Secondary | ICD-10-CM | POA: Diagnosis not present

## 2022-05-13 DIAGNOSIS — N2581 Secondary hyperparathyroidism of renal origin: Secondary | ICD-10-CM | POA: Diagnosis not present

## 2022-05-15 DIAGNOSIS — N2581 Secondary hyperparathyroidism of renal origin: Secondary | ICD-10-CM | POA: Diagnosis not present

## 2022-05-15 DIAGNOSIS — N186 End stage renal disease: Secondary | ICD-10-CM | POA: Diagnosis not present

## 2022-05-15 DIAGNOSIS — Z992 Dependence on renal dialysis: Secondary | ICD-10-CM | POA: Diagnosis not present

## 2022-05-17 DIAGNOSIS — Z992 Dependence on renal dialysis: Secondary | ICD-10-CM | POA: Diagnosis not present

## 2022-05-17 DIAGNOSIS — N186 End stage renal disease: Secondary | ICD-10-CM | POA: Diagnosis not present

## 2022-05-17 DIAGNOSIS — N2581 Secondary hyperparathyroidism of renal origin: Secondary | ICD-10-CM | POA: Diagnosis not present

## 2022-05-17 DIAGNOSIS — I129 Hypertensive chronic kidney disease with stage 1 through stage 4 chronic kidney disease, or unspecified chronic kidney disease: Secondary | ICD-10-CM | POA: Diagnosis not present

## 2022-05-20 DIAGNOSIS — N2581 Secondary hyperparathyroidism of renal origin: Secondary | ICD-10-CM | POA: Diagnosis not present

## 2022-05-20 DIAGNOSIS — Z992 Dependence on renal dialysis: Secondary | ICD-10-CM | POA: Diagnosis not present

## 2022-05-20 DIAGNOSIS — N186 End stage renal disease: Secondary | ICD-10-CM | POA: Diagnosis not present

## 2022-05-22 DIAGNOSIS — Z992 Dependence on renal dialysis: Secondary | ICD-10-CM | POA: Diagnosis not present

## 2022-05-22 DIAGNOSIS — N186 End stage renal disease: Secondary | ICD-10-CM | POA: Diagnosis not present

## 2022-05-22 DIAGNOSIS — N2581 Secondary hyperparathyroidism of renal origin: Secondary | ICD-10-CM | POA: Diagnosis not present

## 2022-05-24 DIAGNOSIS — N2581 Secondary hyperparathyroidism of renal origin: Secondary | ICD-10-CM | POA: Diagnosis not present

## 2022-05-24 DIAGNOSIS — N186 End stage renal disease: Secondary | ICD-10-CM | POA: Diagnosis not present

## 2022-05-24 DIAGNOSIS — Z992 Dependence on renal dialysis: Secondary | ICD-10-CM | POA: Diagnosis not present

## 2022-05-27 DIAGNOSIS — N186 End stage renal disease: Secondary | ICD-10-CM | POA: Diagnosis not present

## 2022-05-27 DIAGNOSIS — Z992 Dependence on renal dialysis: Secondary | ICD-10-CM | POA: Diagnosis not present

## 2022-05-27 DIAGNOSIS — N2581 Secondary hyperparathyroidism of renal origin: Secondary | ICD-10-CM | POA: Diagnosis not present

## 2022-05-29 DIAGNOSIS — N2581 Secondary hyperparathyroidism of renal origin: Secondary | ICD-10-CM | POA: Diagnosis not present

## 2022-05-29 DIAGNOSIS — N186 End stage renal disease: Secondary | ICD-10-CM | POA: Diagnosis not present

## 2022-05-29 DIAGNOSIS — Z992 Dependence on renal dialysis: Secondary | ICD-10-CM | POA: Diagnosis not present

## 2022-05-31 DIAGNOSIS — N2581 Secondary hyperparathyroidism of renal origin: Secondary | ICD-10-CM | POA: Diagnosis not present

## 2022-05-31 DIAGNOSIS — Z992 Dependence on renal dialysis: Secondary | ICD-10-CM | POA: Diagnosis not present

## 2022-05-31 DIAGNOSIS — N186 End stage renal disease: Secondary | ICD-10-CM | POA: Diagnosis not present

## 2022-06-03 DIAGNOSIS — N186 End stage renal disease: Secondary | ICD-10-CM | POA: Diagnosis not present

## 2022-06-03 DIAGNOSIS — Z992 Dependence on renal dialysis: Secondary | ICD-10-CM | POA: Diagnosis not present

## 2022-06-03 DIAGNOSIS — N2581 Secondary hyperparathyroidism of renal origin: Secondary | ICD-10-CM | POA: Diagnosis not present

## 2022-06-05 DIAGNOSIS — Z992 Dependence on renal dialysis: Secondary | ICD-10-CM | POA: Diagnosis not present

## 2022-06-05 DIAGNOSIS — N2581 Secondary hyperparathyroidism of renal origin: Secondary | ICD-10-CM | POA: Diagnosis not present

## 2022-06-05 DIAGNOSIS — N186 End stage renal disease: Secondary | ICD-10-CM | POA: Diagnosis not present

## 2022-06-07 DIAGNOSIS — Z992 Dependence on renal dialysis: Secondary | ICD-10-CM | POA: Diagnosis not present

## 2022-06-07 DIAGNOSIS — N186 End stage renal disease: Secondary | ICD-10-CM | POA: Diagnosis not present

## 2022-06-07 DIAGNOSIS — N2581 Secondary hyperparathyroidism of renal origin: Secondary | ICD-10-CM | POA: Diagnosis not present

## 2022-06-10 DIAGNOSIS — N2581 Secondary hyperparathyroidism of renal origin: Secondary | ICD-10-CM | POA: Diagnosis not present

## 2022-06-10 DIAGNOSIS — Z992 Dependence on renal dialysis: Secondary | ICD-10-CM | POA: Diagnosis not present

## 2022-06-10 DIAGNOSIS — N186 End stage renal disease: Secondary | ICD-10-CM | POA: Diagnosis not present

## 2022-06-12 DIAGNOSIS — Z992 Dependence on renal dialysis: Secondary | ICD-10-CM | POA: Diagnosis not present

## 2022-06-12 DIAGNOSIS — N2581 Secondary hyperparathyroidism of renal origin: Secondary | ICD-10-CM | POA: Diagnosis not present

## 2022-06-12 DIAGNOSIS — N186 End stage renal disease: Secondary | ICD-10-CM | POA: Diagnosis not present

## 2022-06-14 DIAGNOSIS — Z992 Dependence on renal dialysis: Secondary | ICD-10-CM | POA: Diagnosis not present

## 2022-06-14 DIAGNOSIS — N186 End stage renal disease: Secondary | ICD-10-CM | POA: Diagnosis not present

## 2022-06-14 DIAGNOSIS — N2581 Secondary hyperparathyroidism of renal origin: Secondary | ICD-10-CM | POA: Diagnosis not present

## 2022-06-17 DIAGNOSIS — I129 Hypertensive chronic kidney disease with stage 1 through stage 4 chronic kidney disease, or unspecified chronic kidney disease: Secondary | ICD-10-CM | POA: Diagnosis not present

## 2022-06-17 DIAGNOSIS — Z992 Dependence on renal dialysis: Secondary | ICD-10-CM | POA: Diagnosis not present

## 2022-06-17 DIAGNOSIS — N186 End stage renal disease: Secondary | ICD-10-CM | POA: Diagnosis not present

## 2022-06-17 DIAGNOSIS — N2581 Secondary hyperparathyroidism of renal origin: Secondary | ICD-10-CM | POA: Diagnosis not present

## 2022-06-19 DIAGNOSIS — Z992 Dependence on renal dialysis: Secondary | ICD-10-CM | POA: Diagnosis not present

## 2022-06-19 DIAGNOSIS — N186 End stage renal disease: Secondary | ICD-10-CM | POA: Diagnosis not present

## 2022-06-19 DIAGNOSIS — N2581 Secondary hyperparathyroidism of renal origin: Secondary | ICD-10-CM | POA: Diagnosis not present

## 2022-06-21 DIAGNOSIS — N2581 Secondary hyperparathyroidism of renal origin: Secondary | ICD-10-CM | POA: Diagnosis not present

## 2022-06-21 DIAGNOSIS — N186 End stage renal disease: Secondary | ICD-10-CM | POA: Diagnosis not present

## 2022-06-21 DIAGNOSIS — Z992 Dependence on renal dialysis: Secondary | ICD-10-CM | POA: Diagnosis not present

## 2022-06-24 DIAGNOSIS — N2581 Secondary hyperparathyroidism of renal origin: Secondary | ICD-10-CM | POA: Diagnosis not present

## 2022-06-24 DIAGNOSIS — Z992 Dependence on renal dialysis: Secondary | ICD-10-CM | POA: Diagnosis not present

## 2022-06-24 DIAGNOSIS — N186 End stage renal disease: Secondary | ICD-10-CM | POA: Diagnosis not present

## 2022-06-26 DIAGNOSIS — Z992 Dependence on renal dialysis: Secondary | ICD-10-CM | POA: Diagnosis not present

## 2022-06-26 DIAGNOSIS — N2581 Secondary hyperparathyroidism of renal origin: Secondary | ICD-10-CM | POA: Diagnosis not present

## 2022-06-26 DIAGNOSIS — N186 End stage renal disease: Secondary | ICD-10-CM | POA: Diagnosis not present

## 2022-06-28 DIAGNOSIS — N2581 Secondary hyperparathyroidism of renal origin: Secondary | ICD-10-CM | POA: Diagnosis not present

## 2022-06-28 DIAGNOSIS — N186 End stage renal disease: Secondary | ICD-10-CM | POA: Diagnosis not present

## 2022-06-28 DIAGNOSIS — Z992 Dependence on renal dialysis: Secondary | ICD-10-CM | POA: Diagnosis not present

## 2022-07-01 DIAGNOSIS — Z992 Dependence on renal dialysis: Secondary | ICD-10-CM | POA: Diagnosis not present

## 2022-07-01 DIAGNOSIS — N2581 Secondary hyperparathyroidism of renal origin: Secondary | ICD-10-CM | POA: Diagnosis not present

## 2022-07-01 DIAGNOSIS — N186 End stage renal disease: Secondary | ICD-10-CM | POA: Diagnosis not present

## 2022-07-03 DIAGNOSIS — N186 End stage renal disease: Secondary | ICD-10-CM | POA: Diagnosis not present

## 2022-07-03 DIAGNOSIS — N2581 Secondary hyperparathyroidism of renal origin: Secondary | ICD-10-CM | POA: Diagnosis not present

## 2022-07-03 DIAGNOSIS — Z992 Dependence on renal dialysis: Secondary | ICD-10-CM | POA: Diagnosis not present

## 2022-07-05 DIAGNOSIS — N2581 Secondary hyperparathyroidism of renal origin: Secondary | ICD-10-CM | POA: Diagnosis not present

## 2022-07-05 DIAGNOSIS — Z992 Dependence on renal dialysis: Secondary | ICD-10-CM | POA: Diagnosis not present

## 2022-07-05 DIAGNOSIS — N186 End stage renal disease: Secondary | ICD-10-CM | POA: Diagnosis not present

## 2022-07-07 DIAGNOSIS — N186 End stage renal disease: Secondary | ICD-10-CM | POA: Diagnosis not present

## 2022-07-07 DIAGNOSIS — Z992 Dependence on renal dialysis: Secondary | ICD-10-CM | POA: Diagnosis not present

## 2022-07-07 DIAGNOSIS — N2581 Secondary hyperparathyroidism of renal origin: Secondary | ICD-10-CM | POA: Diagnosis not present

## 2022-07-09 DIAGNOSIS — Z992 Dependence on renal dialysis: Secondary | ICD-10-CM | POA: Diagnosis not present

## 2022-07-09 DIAGNOSIS — N186 End stage renal disease: Secondary | ICD-10-CM | POA: Diagnosis not present

## 2022-07-09 DIAGNOSIS — N2581 Secondary hyperparathyroidism of renal origin: Secondary | ICD-10-CM | POA: Diagnosis not present

## 2022-07-12 DIAGNOSIS — N186 End stage renal disease: Secondary | ICD-10-CM | POA: Diagnosis not present

## 2022-07-12 DIAGNOSIS — N2581 Secondary hyperparathyroidism of renal origin: Secondary | ICD-10-CM | POA: Diagnosis not present

## 2022-07-12 DIAGNOSIS — Z992 Dependence on renal dialysis: Secondary | ICD-10-CM | POA: Diagnosis not present

## 2022-07-15 DIAGNOSIS — N2581 Secondary hyperparathyroidism of renal origin: Secondary | ICD-10-CM | POA: Diagnosis not present

## 2022-07-15 DIAGNOSIS — N186 End stage renal disease: Secondary | ICD-10-CM | POA: Diagnosis not present

## 2022-07-15 DIAGNOSIS — Z992 Dependence on renal dialysis: Secondary | ICD-10-CM | POA: Diagnosis not present

## 2022-07-17 DIAGNOSIS — Z992 Dependence on renal dialysis: Secondary | ICD-10-CM | POA: Diagnosis not present

## 2022-07-17 DIAGNOSIS — I129 Hypertensive chronic kidney disease with stage 1 through stage 4 chronic kidney disease, or unspecified chronic kidney disease: Secondary | ICD-10-CM | POA: Diagnosis not present

## 2022-07-17 DIAGNOSIS — N186 End stage renal disease: Secondary | ICD-10-CM | POA: Diagnosis not present

## 2022-07-17 DIAGNOSIS — N2581 Secondary hyperparathyroidism of renal origin: Secondary | ICD-10-CM | POA: Diagnosis not present

## 2022-07-18 DIAGNOSIS — N2581 Secondary hyperparathyroidism of renal origin: Secondary | ICD-10-CM | POA: Diagnosis not present

## 2022-07-18 DIAGNOSIS — Z992 Dependence on renal dialysis: Secondary | ICD-10-CM | POA: Diagnosis not present

## 2022-07-18 DIAGNOSIS — E8770 Fluid overload, unspecified: Secondary | ICD-10-CM | POA: Diagnosis not present

## 2022-07-18 DIAGNOSIS — N186 End stage renal disease: Secondary | ICD-10-CM | POA: Diagnosis not present

## 2022-07-19 DIAGNOSIS — Z992 Dependence on renal dialysis: Secondary | ICD-10-CM | POA: Diagnosis not present

## 2022-07-19 DIAGNOSIS — N186 End stage renal disease: Secondary | ICD-10-CM | POA: Diagnosis not present

## 2022-07-19 DIAGNOSIS — N2581 Secondary hyperparathyroidism of renal origin: Secondary | ICD-10-CM | POA: Diagnosis not present

## 2022-07-19 DIAGNOSIS — E8770 Fluid overload, unspecified: Secondary | ICD-10-CM | POA: Diagnosis not present

## 2022-07-22 DIAGNOSIS — E8770 Fluid overload, unspecified: Secondary | ICD-10-CM | POA: Diagnosis not present

## 2022-07-22 DIAGNOSIS — Z992 Dependence on renal dialysis: Secondary | ICD-10-CM | POA: Diagnosis not present

## 2022-07-22 DIAGNOSIS — N2581 Secondary hyperparathyroidism of renal origin: Secondary | ICD-10-CM | POA: Diagnosis not present

## 2022-07-22 DIAGNOSIS — N186 End stage renal disease: Secondary | ICD-10-CM | POA: Diagnosis not present

## 2022-07-24 DIAGNOSIS — E8770 Fluid overload, unspecified: Secondary | ICD-10-CM | POA: Diagnosis not present

## 2022-07-24 DIAGNOSIS — N2581 Secondary hyperparathyroidism of renal origin: Secondary | ICD-10-CM | POA: Diagnosis not present

## 2022-07-24 DIAGNOSIS — N186 End stage renal disease: Secondary | ICD-10-CM | POA: Diagnosis not present

## 2022-07-24 DIAGNOSIS — Z992 Dependence on renal dialysis: Secondary | ICD-10-CM | POA: Diagnosis not present

## 2022-07-26 DIAGNOSIS — E8770 Fluid overload, unspecified: Secondary | ICD-10-CM | POA: Diagnosis not present

## 2022-07-26 DIAGNOSIS — N186 End stage renal disease: Secondary | ICD-10-CM | POA: Diagnosis not present

## 2022-07-26 DIAGNOSIS — N2581 Secondary hyperparathyroidism of renal origin: Secondary | ICD-10-CM | POA: Diagnosis not present

## 2022-07-26 DIAGNOSIS — Z992 Dependence on renal dialysis: Secondary | ICD-10-CM | POA: Diagnosis not present

## 2022-07-29 DIAGNOSIS — E8770 Fluid overload, unspecified: Secondary | ICD-10-CM | POA: Diagnosis not present

## 2022-07-29 DIAGNOSIS — N2581 Secondary hyperparathyroidism of renal origin: Secondary | ICD-10-CM | POA: Diagnosis not present

## 2022-07-29 DIAGNOSIS — Z992 Dependence on renal dialysis: Secondary | ICD-10-CM | POA: Diagnosis not present

## 2022-07-29 DIAGNOSIS — N186 End stage renal disease: Secondary | ICD-10-CM | POA: Diagnosis not present

## 2022-07-31 DIAGNOSIS — N186 End stage renal disease: Secondary | ICD-10-CM | POA: Diagnosis not present

## 2022-07-31 DIAGNOSIS — Z992 Dependence on renal dialysis: Secondary | ICD-10-CM | POA: Diagnosis not present

## 2022-07-31 DIAGNOSIS — N2581 Secondary hyperparathyroidism of renal origin: Secondary | ICD-10-CM | POA: Diagnosis not present

## 2022-07-31 DIAGNOSIS — E8770 Fluid overload, unspecified: Secondary | ICD-10-CM | POA: Diagnosis not present

## 2022-08-02 DIAGNOSIS — N186 End stage renal disease: Secondary | ICD-10-CM | POA: Diagnosis not present

## 2022-08-02 DIAGNOSIS — E8770 Fluid overload, unspecified: Secondary | ICD-10-CM | POA: Diagnosis not present

## 2022-08-02 DIAGNOSIS — N2581 Secondary hyperparathyroidism of renal origin: Secondary | ICD-10-CM | POA: Diagnosis not present

## 2022-08-02 DIAGNOSIS — Z992 Dependence on renal dialysis: Secondary | ICD-10-CM | POA: Diagnosis not present

## 2022-08-05 DIAGNOSIS — N2581 Secondary hyperparathyroidism of renal origin: Secondary | ICD-10-CM | POA: Diagnosis not present

## 2022-08-05 DIAGNOSIS — E8770 Fluid overload, unspecified: Secondary | ICD-10-CM | POA: Diagnosis not present

## 2022-08-05 DIAGNOSIS — N186 End stage renal disease: Secondary | ICD-10-CM | POA: Diagnosis not present

## 2022-08-05 DIAGNOSIS — Z992 Dependence on renal dialysis: Secondary | ICD-10-CM | POA: Diagnosis not present

## 2022-08-07 DIAGNOSIS — Z992 Dependence on renal dialysis: Secondary | ICD-10-CM | POA: Diagnosis not present

## 2022-08-07 DIAGNOSIS — N2581 Secondary hyperparathyroidism of renal origin: Secondary | ICD-10-CM | POA: Diagnosis not present

## 2022-08-07 DIAGNOSIS — E8770 Fluid overload, unspecified: Secondary | ICD-10-CM | POA: Diagnosis not present

## 2022-08-07 DIAGNOSIS — N186 End stage renal disease: Secondary | ICD-10-CM | POA: Diagnosis not present

## 2022-08-08 DIAGNOSIS — N186 End stage renal disease: Secondary | ICD-10-CM | POA: Diagnosis not present

## 2022-08-08 DIAGNOSIS — Z992 Dependence on renal dialysis: Secondary | ICD-10-CM | POA: Diagnosis not present

## 2022-08-08 DIAGNOSIS — I129 Hypertensive chronic kidney disease with stage 1 through stage 4 chronic kidney disease, or unspecified chronic kidney disease: Secondary | ICD-10-CM | POA: Diagnosis not present

## 2022-08-08 DIAGNOSIS — Z0000 Encounter for general adult medical examination without abnormal findings: Secondary | ICD-10-CM | POA: Diagnosis not present

## 2022-08-09 DIAGNOSIS — N186 End stage renal disease: Secondary | ICD-10-CM | POA: Diagnosis not present

## 2022-08-09 DIAGNOSIS — Z992 Dependence on renal dialysis: Secondary | ICD-10-CM | POA: Diagnosis not present

## 2022-08-09 DIAGNOSIS — E8770 Fluid overload, unspecified: Secondary | ICD-10-CM | POA: Diagnosis not present

## 2022-08-09 DIAGNOSIS — N2581 Secondary hyperparathyroidism of renal origin: Secondary | ICD-10-CM | POA: Diagnosis not present

## 2022-08-12 DIAGNOSIS — N186 End stage renal disease: Secondary | ICD-10-CM | POA: Diagnosis not present

## 2022-08-12 DIAGNOSIS — E8770 Fluid overload, unspecified: Secondary | ICD-10-CM | POA: Diagnosis not present

## 2022-08-12 DIAGNOSIS — N2581 Secondary hyperparathyroidism of renal origin: Secondary | ICD-10-CM | POA: Diagnosis not present

## 2022-08-12 DIAGNOSIS — Z992 Dependence on renal dialysis: Secondary | ICD-10-CM | POA: Diagnosis not present

## 2022-08-14 DIAGNOSIS — Z992 Dependence on renal dialysis: Secondary | ICD-10-CM | POA: Diagnosis not present

## 2022-08-14 DIAGNOSIS — N186 End stage renal disease: Secondary | ICD-10-CM | POA: Diagnosis not present

## 2022-08-14 DIAGNOSIS — E8770 Fluid overload, unspecified: Secondary | ICD-10-CM | POA: Diagnosis not present

## 2022-08-14 DIAGNOSIS — N2581 Secondary hyperparathyroidism of renal origin: Secondary | ICD-10-CM | POA: Diagnosis not present

## 2022-08-16 DIAGNOSIS — N186 End stage renal disease: Secondary | ICD-10-CM | POA: Diagnosis not present

## 2022-08-16 DIAGNOSIS — N2581 Secondary hyperparathyroidism of renal origin: Secondary | ICD-10-CM | POA: Diagnosis not present

## 2022-08-16 DIAGNOSIS — Z992 Dependence on renal dialysis: Secondary | ICD-10-CM | POA: Diagnosis not present

## 2022-08-16 DIAGNOSIS — E8770 Fluid overload, unspecified: Secondary | ICD-10-CM | POA: Diagnosis not present

## 2022-08-17 DIAGNOSIS — Z992 Dependence on renal dialysis: Secondary | ICD-10-CM | POA: Diagnosis not present

## 2022-08-17 DIAGNOSIS — N186 End stage renal disease: Secondary | ICD-10-CM | POA: Diagnosis not present

## 2022-08-17 DIAGNOSIS — I129 Hypertensive chronic kidney disease with stage 1 through stage 4 chronic kidney disease, or unspecified chronic kidney disease: Secondary | ICD-10-CM | POA: Diagnosis not present

## 2022-08-19 DIAGNOSIS — Z992 Dependence on renal dialysis: Secondary | ICD-10-CM | POA: Diagnosis not present

## 2022-08-19 DIAGNOSIS — N186 End stage renal disease: Secondary | ICD-10-CM | POA: Diagnosis not present

## 2022-08-19 DIAGNOSIS — N2581 Secondary hyperparathyroidism of renal origin: Secondary | ICD-10-CM | POA: Diagnosis not present

## 2022-08-21 DIAGNOSIS — Z992 Dependence on renal dialysis: Secondary | ICD-10-CM | POA: Diagnosis not present

## 2022-08-21 DIAGNOSIS — N186 End stage renal disease: Secondary | ICD-10-CM | POA: Diagnosis not present

## 2022-08-21 DIAGNOSIS — N2581 Secondary hyperparathyroidism of renal origin: Secondary | ICD-10-CM | POA: Diagnosis not present

## 2022-08-23 DIAGNOSIS — Z992 Dependence on renal dialysis: Secondary | ICD-10-CM | POA: Diagnosis not present

## 2022-08-23 DIAGNOSIS — N186 End stage renal disease: Secondary | ICD-10-CM | POA: Diagnosis not present

## 2022-08-23 DIAGNOSIS — N2581 Secondary hyperparathyroidism of renal origin: Secondary | ICD-10-CM | POA: Diagnosis not present

## 2022-08-26 DIAGNOSIS — N2581 Secondary hyperparathyroidism of renal origin: Secondary | ICD-10-CM | POA: Diagnosis not present

## 2022-08-26 DIAGNOSIS — Z992 Dependence on renal dialysis: Secondary | ICD-10-CM | POA: Diagnosis not present

## 2022-08-26 DIAGNOSIS — N186 End stage renal disease: Secondary | ICD-10-CM | POA: Diagnosis not present

## 2022-08-28 DIAGNOSIS — Z992 Dependence on renal dialysis: Secondary | ICD-10-CM | POA: Diagnosis not present

## 2022-08-28 DIAGNOSIS — N2581 Secondary hyperparathyroidism of renal origin: Secondary | ICD-10-CM | POA: Diagnosis not present

## 2022-08-28 DIAGNOSIS — N186 End stage renal disease: Secondary | ICD-10-CM | POA: Diagnosis not present

## 2022-08-30 DIAGNOSIS — Z992 Dependence on renal dialysis: Secondary | ICD-10-CM | POA: Diagnosis not present

## 2022-08-30 DIAGNOSIS — N2581 Secondary hyperparathyroidism of renal origin: Secondary | ICD-10-CM | POA: Diagnosis not present

## 2022-08-30 DIAGNOSIS — N186 End stage renal disease: Secondary | ICD-10-CM | POA: Diagnosis not present

## 2022-09-02 DIAGNOSIS — Z992 Dependence on renal dialysis: Secondary | ICD-10-CM | POA: Diagnosis not present

## 2022-09-02 DIAGNOSIS — N2581 Secondary hyperparathyroidism of renal origin: Secondary | ICD-10-CM | POA: Diagnosis not present

## 2022-09-02 DIAGNOSIS — N186 End stage renal disease: Secondary | ICD-10-CM | POA: Diagnosis not present

## 2022-09-04 DIAGNOSIS — N186 End stage renal disease: Secondary | ICD-10-CM | POA: Diagnosis not present

## 2022-09-04 DIAGNOSIS — Z992 Dependence on renal dialysis: Secondary | ICD-10-CM | POA: Diagnosis not present

## 2022-09-04 DIAGNOSIS — N2581 Secondary hyperparathyroidism of renal origin: Secondary | ICD-10-CM | POA: Diagnosis not present

## 2022-09-06 DIAGNOSIS — N2581 Secondary hyperparathyroidism of renal origin: Secondary | ICD-10-CM | POA: Diagnosis not present

## 2022-09-06 DIAGNOSIS — Z992 Dependence on renal dialysis: Secondary | ICD-10-CM | POA: Diagnosis not present

## 2022-09-06 DIAGNOSIS — N186 End stage renal disease: Secondary | ICD-10-CM | POA: Diagnosis not present

## 2022-09-09 DIAGNOSIS — N2581 Secondary hyperparathyroidism of renal origin: Secondary | ICD-10-CM | POA: Diagnosis not present

## 2022-09-09 DIAGNOSIS — Z992 Dependence on renal dialysis: Secondary | ICD-10-CM | POA: Diagnosis not present

## 2022-09-09 DIAGNOSIS — N186 End stage renal disease: Secondary | ICD-10-CM | POA: Diagnosis not present

## 2022-09-11 DIAGNOSIS — Z992 Dependence on renal dialysis: Secondary | ICD-10-CM | POA: Diagnosis not present

## 2022-09-11 DIAGNOSIS — N186 End stage renal disease: Secondary | ICD-10-CM | POA: Diagnosis not present

## 2022-09-11 DIAGNOSIS — N2581 Secondary hyperparathyroidism of renal origin: Secondary | ICD-10-CM | POA: Diagnosis not present

## 2022-09-12 DIAGNOSIS — I9589 Other hypotension: Secondary | ICD-10-CM | POA: Diagnosis not present

## 2022-09-12 DIAGNOSIS — I120 Hypertensive chronic kidney disease with stage 5 chronic kidney disease or end stage renal disease: Secondary | ICD-10-CM | POA: Diagnosis not present

## 2022-09-13 DIAGNOSIS — N186 End stage renal disease: Secondary | ICD-10-CM | POA: Diagnosis not present

## 2022-09-13 DIAGNOSIS — N2581 Secondary hyperparathyroidism of renal origin: Secondary | ICD-10-CM | POA: Diagnosis not present

## 2022-09-13 DIAGNOSIS — Z992 Dependence on renal dialysis: Secondary | ICD-10-CM | POA: Diagnosis not present

## 2022-09-16 DIAGNOSIS — N2581 Secondary hyperparathyroidism of renal origin: Secondary | ICD-10-CM | POA: Diagnosis not present

## 2022-09-16 DIAGNOSIS — N186 End stage renal disease: Secondary | ICD-10-CM | POA: Diagnosis not present

## 2022-09-16 DIAGNOSIS — Z992 Dependence on renal dialysis: Secondary | ICD-10-CM | POA: Diagnosis not present

## 2022-09-17 DIAGNOSIS — I129 Hypertensive chronic kidney disease with stage 1 through stage 4 chronic kidney disease, or unspecified chronic kidney disease: Secondary | ICD-10-CM | POA: Diagnosis not present

## 2022-09-17 DIAGNOSIS — Z992 Dependence on renal dialysis: Secondary | ICD-10-CM | POA: Diagnosis not present

## 2022-09-17 DIAGNOSIS — N186 End stage renal disease: Secondary | ICD-10-CM | POA: Diagnosis not present

## 2022-09-18 DIAGNOSIS — N2581 Secondary hyperparathyroidism of renal origin: Secondary | ICD-10-CM | POA: Diagnosis not present

## 2022-09-18 DIAGNOSIS — Z992 Dependence on renal dialysis: Secondary | ICD-10-CM | POA: Diagnosis not present

## 2022-09-18 DIAGNOSIS — N186 End stage renal disease: Secondary | ICD-10-CM | POA: Diagnosis not present

## 2022-09-19 DIAGNOSIS — Z992 Dependence on renal dialysis: Secondary | ICD-10-CM | POA: Diagnosis not present

## 2022-09-19 DIAGNOSIS — I871 Compression of vein: Secondary | ICD-10-CM | POA: Diagnosis not present

## 2022-09-19 DIAGNOSIS — N186 End stage renal disease: Secondary | ICD-10-CM | POA: Diagnosis not present

## 2022-09-20 DIAGNOSIS — N186 End stage renal disease: Secondary | ICD-10-CM | POA: Diagnosis not present

## 2022-09-20 DIAGNOSIS — Z992 Dependence on renal dialysis: Secondary | ICD-10-CM | POA: Diagnosis not present

## 2022-09-20 DIAGNOSIS — N2581 Secondary hyperparathyroidism of renal origin: Secondary | ICD-10-CM | POA: Diagnosis not present

## 2022-09-23 DIAGNOSIS — Z992 Dependence on renal dialysis: Secondary | ICD-10-CM | POA: Diagnosis not present

## 2022-09-23 DIAGNOSIS — N2581 Secondary hyperparathyroidism of renal origin: Secondary | ICD-10-CM | POA: Diagnosis not present

## 2022-09-23 DIAGNOSIS — N186 End stage renal disease: Secondary | ICD-10-CM | POA: Diagnosis not present

## 2022-09-25 DIAGNOSIS — N2581 Secondary hyperparathyroidism of renal origin: Secondary | ICD-10-CM | POA: Diagnosis not present

## 2022-09-25 DIAGNOSIS — Z992 Dependence on renal dialysis: Secondary | ICD-10-CM | POA: Diagnosis not present

## 2022-09-25 DIAGNOSIS — N186 End stage renal disease: Secondary | ICD-10-CM | POA: Diagnosis not present

## 2022-09-27 DIAGNOSIS — N186 End stage renal disease: Secondary | ICD-10-CM | POA: Diagnosis not present

## 2022-09-27 DIAGNOSIS — N2581 Secondary hyperparathyroidism of renal origin: Secondary | ICD-10-CM | POA: Diagnosis not present

## 2022-09-27 DIAGNOSIS — Z992 Dependence on renal dialysis: Secondary | ICD-10-CM | POA: Diagnosis not present

## 2022-09-30 DIAGNOSIS — Z992 Dependence on renal dialysis: Secondary | ICD-10-CM | POA: Diagnosis not present

## 2022-09-30 DIAGNOSIS — N186 End stage renal disease: Secondary | ICD-10-CM | POA: Diagnosis not present

## 2022-09-30 DIAGNOSIS — N2581 Secondary hyperparathyroidism of renal origin: Secondary | ICD-10-CM | POA: Diagnosis not present

## 2022-10-02 DIAGNOSIS — Z992 Dependence on renal dialysis: Secondary | ICD-10-CM | POA: Diagnosis not present

## 2022-10-02 DIAGNOSIS — N2581 Secondary hyperparathyroidism of renal origin: Secondary | ICD-10-CM | POA: Diagnosis not present

## 2022-10-02 DIAGNOSIS — N186 End stage renal disease: Secondary | ICD-10-CM | POA: Diagnosis not present

## 2022-10-03 DIAGNOSIS — M13862 Other specified arthritis, left knee: Secondary | ICD-10-CM | POA: Diagnosis not present

## 2022-10-03 DIAGNOSIS — N185 Chronic kidney disease, stage 5: Secondary | ICD-10-CM | POA: Diagnosis not present

## 2022-10-03 DIAGNOSIS — I120 Hypertensive chronic kidney disease with stage 5 chronic kidney disease or end stage renal disease: Secondary | ICD-10-CM | POA: Diagnosis not present

## 2022-10-04 DIAGNOSIS — N2581 Secondary hyperparathyroidism of renal origin: Secondary | ICD-10-CM | POA: Diagnosis not present

## 2022-10-04 DIAGNOSIS — N186 End stage renal disease: Secondary | ICD-10-CM | POA: Diagnosis not present

## 2022-10-04 DIAGNOSIS — Z992 Dependence on renal dialysis: Secondary | ICD-10-CM | POA: Diagnosis not present

## 2022-10-05 ENCOUNTER — Ambulatory Visit: Payer: Self-pay

## 2022-10-07 DIAGNOSIS — Z992 Dependence on renal dialysis: Secondary | ICD-10-CM | POA: Diagnosis not present

## 2022-10-07 DIAGNOSIS — N186 End stage renal disease: Secondary | ICD-10-CM | POA: Diagnosis not present

## 2022-10-07 DIAGNOSIS — N2581 Secondary hyperparathyroidism of renal origin: Secondary | ICD-10-CM | POA: Diagnosis not present

## 2022-10-09 DIAGNOSIS — Z992 Dependence on renal dialysis: Secondary | ICD-10-CM | POA: Diagnosis not present

## 2022-10-09 DIAGNOSIS — N186 End stage renal disease: Secondary | ICD-10-CM | POA: Diagnosis not present

## 2022-10-09 DIAGNOSIS — N2581 Secondary hyperparathyroidism of renal origin: Secondary | ICD-10-CM | POA: Diagnosis not present

## 2022-10-11 DIAGNOSIS — Z992 Dependence on renal dialysis: Secondary | ICD-10-CM | POA: Diagnosis not present

## 2022-10-11 DIAGNOSIS — N2581 Secondary hyperparathyroidism of renal origin: Secondary | ICD-10-CM | POA: Diagnosis not present

## 2022-10-11 DIAGNOSIS — N186 End stage renal disease: Secondary | ICD-10-CM | POA: Diagnosis not present

## 2022-10-14 DIAGNOSIS — N2581 Secondary hyperparathyroidism of renal origin: Secondary | ICD-10-CM | POA: Diagnosis not present

## 2022-10-14 DIAGNOSIS — N186 End stage renal disease: Secondary | ICD-10-CM | POA: Diagnosis not present

## 2022-10-14 DIAGNOSIS — Z992 Dependence on renal dialysis: Secondary | ICD-10-CM | POA: Diagnosis not present

## 2022-10-16 DIAGNOSIS — N2581 Secondary hyperparathyroidism of renal origin: Secondary | ICD-10-CM | POA: Diagnosis not present

## 2022-10-16 DIAGNOSIS — I129 Hypertensive chronic kidney disease with stage 1 through stage 4 chronic kidney disease, or unspecified chronic kidney disease: Secondary | ICD-10-CM | POA: Diagnosis not present

## 2022-10-16 DIAGNOSIS — N186 End stage renal disease: Secondary | ICD-10-CM | POA: Diagnosis not present

## 2022-10-16 DIAGNOSIS — Z992 Dependence on renal dialysis: Secondary | ICD-10-CM | POA: Diagnosis not present

## 2022-10-18 DIAGNOSIS — N186 End stage renal disease: Secondary | ICD-10-CM | POA: Diagnosis not present

## 2022-10-18 DIAGNOSIS — Z992 Dependence on renal dialysis: Secondary | ICD-10-CM | POA: Diagnosis not present

## 2022-10-18 DIAGNOSIS — N2581 Secondary hyperparathyroidism of renal origin: Secondary | ICD-10-CM | POA: Diagnosis not present

## 2022-10-21 DIAGNOSIS — N2581 Secondary hyperparathyroidism of renal origin: Secondary | ICD-10-CM | POA: Diagnosis not present

## 2022-10-21 DIAGNOSIS — N186 End stage renal disease: Secondary | ICD-10-CM | POA: Diagnosis not present

## 2022-10-21 DIAGNOSIS — Z992 Dependence on renal dialysis: Secondary | ICD-10-CM | POA: Diagnosis not present

## 2022-10-23 DIAGNOSIS — N186 End stage renal disease: Secondary | ICD-10-CM | POA: Diagnosis not present

## 2022-10-23 DIAGNOSIS — N2581 Secondary hyperparathyroidism of renal origin: Secondary | ICD-10-CM | POA: Diagnosis not present

## 2022-10-23 DIAGNOSIS — Z992 Dependence on renal dialysis: Secondary | ICD-10-CM | POA: Diagnosis not present

## 2022-10-25 DIAGNOSIS — N2581 Secondary hyperparathyroidism of renal origin: Secondary | ICD-10-CM | POA: Diagnosis not present

## 2022-10-25 DIAGNOSIS — N186 End stage renal disease: Secondary | ICD-10-CM | POA: Diagnosis not present

## 2022-10-25 DIAGNOSIS — Z992 Dependence on renal dialysis: Secondary | ICD-10-CM | POA: Diagnosis not present

## 2022-10-28 DIAGNOSIS — N2581 Secondary hyperparathyroidism of renal origin: Secondary | ICD-10-CM | POA: Diagnosis not present

## 2022-10-28 DIAGNOSIS — N186 End stage renal disease: Secondary | ICD-10-CM | POA: Diagnosis not present

## 2022-10-28 DIAGNOSIS — Z992 Dependence on renal dialysis: Secondary | ICD-10-CM | POA: Diagnosis not present

## 2022-10-30 DIAGNOSIS — N2581 Secondary hyperparathyroidism of renal origin: Secondary | ICD-10-CM | POA: Diagnosis not present

## 2022-10-30 DIAGNOSIS — N186 End stage renal disease: Secondary | ICD-10-CM | POA: Diagnosis not present

## 2022-10-30 DIAGNOSIS — Z992 Dependence on renal dialysis: Secondary | ICD-10-CM | POA: Diagnosis not present

## 2022-11-01 DIAGNOSIS — N2581 Secondary hyperparathyroidism of renal origin: Secondary | ICD-10-CM | POA: Diagnosis not present

## 2022-11-01 DIAGNOSIS — Z992 Dependence on renal dialysis: Secondary | ICD-10-CM | POA: Diagnosis not present

## 2022-11-01 DIAGNOSIS — N186 End stage renal disease: Secondary | ICD-10-CM | POA: Diagnosis not present

## 2022-11-04 DIAGNOSIS — N186 End stage renal disease: Secondary | ICD-10-CM | POA: Diagnosis not present

## 2022-11-04 DIAGNOSIS — N2581 Secondary hyperparathyroidism of renal origin: Secondary | ICD-10-CM | POA: Diagnosis not present

## 2022-11-04 DIAGNOSIS — Z992 Dependence on renal dialysis: Secondary | ICD-10-CM | POA: Diagnosis not present

## 2022-11-06 DIAGNOSIS — N186 End stage renal disease: Secondary | ICD-10-CM | POA: Diagnosis not present

## 2022-11-06 DIAGNOSIS — Z992 Dependence on renal dialysis: Secondary | ICD-10-CM | POA: Diagnosis not present

## 2022-11-06 DIAGNOSIS — N2581 Secondary hyperparathyroidism of renal origin: Secondary | ICD-10-CM | POA: Diagnosis not present

## 2022-11-08 DIAGNOSIS — N186 End stage renal disease: Secondary | ICD-10-CM | POA: Diagnosis not present

## 2022-11-08 DIAGNOSIS — N2581 Secondary hyperparathyroidism of renal origin: Secondary | ICD-10-CM | POA: Diagnosis not present

## 2022-11-08 DIAGNOSIS — Z992 Dependence on renal dialysis: Secondary | ICD-10-CM | POA: Diagnosis not present

## 2022-11-11 DIAGNOSIS — N2581 Secondary hyperparathyroidism of renal origin: Secondary | ICD-10-CM | POA: Diagnosis not present

## 2022-11-11 DIAGNOSIS — N186 End stage renal disease: Secondary | ICD-10-CM | POA: Diagnosis not present

## 2022-11-11 DIAGNOSIS — Z992 Dependence on renal dialysis: Secondary | ICD-10-CM | POA: Diagnosis not present

## 2022-11-13 DIAGNOSIS — N186 End stage renal disease: Secondary | ICD-10-CM | POA: Diagnosis not present

## 2022-11-13 DIAGNOSIS — N2581 Secondary hyperparathyroidism of renal origin: Secondary | ICD-10-CM | POA: Diagnosis not present

## 2022-11-13 DIAGNOSIS — Z992 Dependence on renal dialysis: Secondary | ICD-10-CM | POA: Diagnosis not present

## 2022-11-15 DIAGNOSIS — N186 End stage renal disease: Secondary | ICD-10-CM | POA: Diagnosis not present

## 2022-11-15 DIAGNOSIS — N2581 Secondary hyperparathyroidism of renal origin: Secondary | ICD-10-CM | POA: Diagnosis not present

## 2022-11-15 DIAGNOSIS — Z992 Dependence on renal dialysis: Secondary | ICD-10-CM | POA: Diagnosis not present

## 2022-11-16 DIAGNOSIS — I129 Hypertensive chronic kidney disease with stage 1 through stage 4 chronic kidney disease, or unspecified chronic kidney disease: Secondary | ICD-10-CM | POA: Diagnosis not present

## 2022-11-16 DIAGNOSIS — N186 End stage renal disease: Secondary | ICD-10-CM | POA: Diagnosis not present

## 2022-11-16 DIAGNOSIS — Z992 Dependence on renal dialysis: Secondary | ICD-10-CM | POA: Diagnosis not present

## 2022-11-18 DIAGNOSIS — N2581 Secondary hyperparathyroidism of renal origin: Secondary | ICD-10-CM | POA: Diagnosis not present

## 2022-11-18 DIAGNOSIS — N186 End stage renal disease: Secondary | ICD-10-CM | POA: Diagnosis not present

## 2022-11-18 DIAGNOSIS — Z992 Dependence on renal dialysis: Secondary | ICD-10-CM | POA: Diagnosis not present

## 2022-11-20 DIAGNOSIS — N186 End stage renal disease: Secondary | ICD-10-CM | POA: Diagnosis not present

## 2022-11-20 DIAGNOSIS — N2581 Secondary hyperparathyroidism of renal origin: Secondary | ICD-10-CM | POA: Diagnosis not present

## 2022-11-20 DIAGNOSIS — Z992 Dependence on renal dialysis: Secondary | ICD-10-CM | POA: Diagnosis not present

## 2022-11-22 DIAGNOSIS — N2581 Secondary hyperparathyroidism of renal origin: Secondary | ICD-10-CM | POA: Diagnosis not present

## 2022-11-22 DIAGNOSIS — Z992 Dependence on renal dialysis: Secondary | ICD-10-CM | POA: Diagnosis not present

## 2022-11-22 DIAGNOSIS — N186 End stage renal disease: Secondary | ICD-10-CM | POA: Diagnosis not present

## 2022-11-25 DIAGNOSIS — N2581 Secondary hyperparathyroidism of renal origin: Secondary | ICD-10-CM | POA: Diagnosis not present

## 2022-11-25 DIAGNOSIS — N186 End stage renal disease: Secondary | ICD-10-CM | POA: Diagnosis not present

## 2022-11-25 DIAGNOSIS — Z992 Dependence on renal dialysis: Secondary | ICD-10-CM | POA: Diagnosis not present

## 2022-11-27 DIAGNOSIS — Z992 Dependence on renal dialysis: Secondary | ICD-10-CM | POA: Diagnosis not present

## 2022-11-27 DIAGNOSIS — N2581 Secondary hyperparathyroidism of renal origin: Secondary | ICD-10-CM | POA: Diagnosis not present

## 2022-11-27 DIAGNOSIS — N186 End stage renal disease: Secondary | ICD-10-CM | POA: Diagnosis not present

## 2022-11-29 DIAGNOSIS — N2581 Secondary hyperparathyroidism of renal origin: Secondary | ICD-10-CM | POA: Diagnosis not present

## 2022-11-29 DIAGNOSIS — Z992 Dependence on renal dialysis: Secondary | ICD-10-CM | POA: Diagnosis not present

## 2022-11-29 DIAGNOSIS — N186 End stage renal disease: Secondary | ICD-10-CM | POA: Diagnosis not present

## 2022-12-02 DIAGNOSIS — Z992 Dependence on renal dialysis: Secondary | ICD-10-CM | POA: Diagnosis not present

## 2022-12-02 DIAGNOSIS — N2581 Secondary hyperparathyroidism of renal origin: Secondary | ICD-10-CM | POA: Diagnosis not present

## 2022-12-02 DIAGNOSIS — N186 End stage renal disease: Secondary | ICD-10-CM | POA: Diagnosis not present

## 2022-12-04 DIAGNOSIS — N186 End stage renal disease: Secondary | ICD-10-CM | POA: Diagnosis not present

## 2022-12-04 DIAGNOSIS — Z992 Dependence on renal dialysis: Secondary | ICD-10-CM | POA: Diagnosis not present

## 2022-12-04 DIAGNOSIS — N2581 Secondary hyperparathyroidism of renal origin: Secondary | ICD-10-CM | POA: Diagnosis not present

## 2022-12-06 DIAGNOSIS — N186 End stage renal disease: Secondary | ICD-10-CM | POA: Diagnosis not present

## 2022-12-06 DIAGNOSIS — Z992 Dependence on renal dialysis: Secondary | ICD-10-CM | POA: Diagnosis not present

## 2022-12-06 DIAGNOSIS — N2581 Secondary hyperparathyroidism of renal origin: Secondary | ICD-10-CM | POA: Diagnosis not present

## 2022-12-09 DIAGNOSIS — N186 End stage renal disease: Secondary | ICD-10-CM | POA: Diagnosis not present

## 2022-12-09 DIAGNOSIS — N2581 Secondary hyperparathyroidism of renal origin: Secondary | ICD-10-CM | POA: Diagnosis not present

## 2022-12-09 DIAGNOSIS — Z992 Dependence on renal dialysis: Secondary | ICD-10-CM | POA: Diagnosis not present

## 2022-12-11 DIAGNOSIS — N186 End stage renal disease: Secondary | ICD-10-CM | POA: Diagnosis not present

## 2022-12-11 DIAGNOSIS — Z992 Dependence on renal dialysis: Secondary | ICD-10-CM | POA: Diagnosis not present

## 2022-12-11 DIAGNOSIS — N2581 Secondary hyperparathyroidism of renal origin: Secondary | ICD-10-CM | POA: Diagnosis not present

## 2022-12-13 DIAGNOSIS — N2581 Secondary hyperparathyroidism of renal origin: Secondary | ICD-10-CM | POA: Diagnosis not present

## 2022-12-13 DIAGNOSIS — N186 End stage renal disease: Secondary | ICD-10-CM | POA: Diagnosis not present

## 2022-12-13 DIAGNOSIS — Z992 Dependence on renal dialysis: Secondary | ICD-10-CM | POA: Diagnosis not present

## 2022-12-15 ENCOUNTER — Ambulatory Visit (INDEPENDENT_AMBULATORY_CARE_PROVIDER_SITE_OTHER): Payer: Medicare HMO

## 2022-12-15 ENCOUNTER — Ambulatory Visit
Admission: RE | Admit: 2022-12-15 | Discharge: 2022-12-15 | Disposition: A | Discharge status: Home / Self Care | Payer: Medicare HMO | Source: Ambulatory Visit | Attending: Internal Medicine | Admitting: Internal Medicine

## 2022-12-15 ENCOUNTER — Other Ambulatory Visit: Payer: Self-pay

## 2022-12-15 VITALS — BP 136/72 | HR 82 | Temp 98.0°F | Resp 18

## 2022-12-15 DIAGNOSIS — M25561 Pain in right knee: Secondary | ICD-10-CM

## 2022-12-15 DIAGNOSIS — M25461 Effusion, right knee: Secondary | ICD-10-CM | POA: Diagnosis not present

## 2022-12-15 NOTE — ED Provider Notes (Addendum)
[facility]    CSN: [id number] Arrival date & time: 12/15/22  1350      History   Chief Complaint Chief Complaint  Patient presents with   Knee Pain    Entered by patient    HPI [name] Downs is a 57 y.o. male.   Patient presents with right knee and lower leg pain that started yesterday after an injury.  Patient reports that he does have chronic right knee pain but it is worsened since injury.  Patient states that he was playing with his grandchildren when he jumped off the back of a truck causing subsequent pain.  Reports the pain is present in the anterior knee and wraps around to the calf.  Patient is able to bear weight but reports that bearing weight exacerbates pain.  He has not had any medications for pain.  Denies numbness or tingling. Patient was ambulatory prior to injury.    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) 06/19/2021   Pruritus, unspecified 04/29/2021   Alcohol abuse 04/10/2021   Clotted dialysis access (HCC) 04/10/2021   S/P diskectomy 04/10/2021   SAH (subarachnoid hemorrhage) (HCC) 04/10/2021   Coagulation defect, unspecified (HCC) 03/12/2021   Allergy, unspecified, initial encounter 03/05/2021   Anaphylactic shock, unspecified, initial encounter 03/05/2021   Anemia in chronic kidney disease 03/05/2021   A-V fistula (HCC) 03/05/2021   Encounter for general adult medical examination without abnormal findings 03/05/2021   Hematuria, unspecified 03/05/2021   Hyperkalemia 03/05/2021   Hyperlipidemia, unspecified 03/05/2021   Iron deficiency anemia, unspecified 03/05/2021   Male erectile  dysfunction, unspecified 03/05/2021   Nicotine dependence, unspecified, uncomplicated 03/05/2021   Pain, unspecified 03/05/2021   Proteinuria, unspecified 03/05/2021   Unspecified osteoarthritis, unspecified site 03/05/2021   BMI 32.0-32.9,adult 05/30/2020   OSA (obstructive sleep apnea) 05/30/2020   Brain aneurysm 05/30/2020   COVID-19 05/30/2020   Kidney disease 08/20/2018   Deafness in left ear 10/08/2017   Gout 10/08/2017   Secondary hyperparathyroidism (HCC) 10/08/2017   Tobacco abuse 01/17/2014   CKD (chronic kidney disease) stage 4, GFR 15-29 ml/min (HCC) 12/27/2013   Aortic dissection (HCC)    Hypertension    Medically noncompliant     Past Surgical History:  Procedure Laterality Date   AV FISTULA PLACEMENT Right 12/17/2015   Procedure: CREATION OF RADIOCEPHALIC  ARTERIOVENOUS (AV) FISTULA RIGHT LOWER  ARM;  Surgeon: Sherren Kerns, MD;  Location: [facility] OR;  Service: Vascular;  Laterality: Right;   AV FISTULA PLACEMENT Right 12/17/2015   Procedure: CREATION OF RIGHT UPPER ARM  BRACHIO CEPHALIC ARTERIOVENOUS (AV) FISTULA;  Surgeon: Sherren Kerns, MD;  Location: [facility] OR;  Service: Vascular;  Laterality: Right;   AV FISTULA PLACEMENT Left 09/08/2017   Procedure: ARTERIOVENOUS (AV) FISTULA CREATION LEFT BRACHIOCEPHALIC;  Surgeon: Sherren Kerns, MD;  Location: [facility] OR;  Service: Vascular;  Laterality: Left;   AV FISTULA PLACEMENT Left 11/23/2019   Procedure: LEFT ARM BASILIC VEIN (AV) TRANSPOSITION;  Surgeon: Larina Earthly, MD;  Location: [facility] OR;  Service: Vascular;  Laterality: Left;   BACK SURGERY         Home Medications    Prior to Admission medications   Medication Sig Start Date End Date Taking? Authorizing Provider  allopurinol (ZYLOPRIM) 100 MG tablet Take 100 mg by mouth in the morning, at noon, in the evening, and at bedtime.  09/08/17   Rhyne, Ames Coupe, PA-C  amLODipine (NORVASC) 10 MG tablet Take 10 mg by mouth at bedtime.     [provider]  calcitRIOL  (ROCALTROL) 0.25 MCG capsule Take 0.25 mcg by mouth daily.     [provider]  cinacalcet (SENSIPAR) 30 MG tablet SMARTSIG:1 Tablet(s) By Mouth Every Evening 06/08/21   [provider]  dextromethorphan-guaiFENesin (MUCINEX DM) 30-600 MG 12hr tablet Take 1 tablet by mouth 2 (two) times daily. 05/28/20   Wieters, Hallie C, PA-C  esomeprazole (NEXIUM) 40 MG capsule Take 1 capsule (40 mg total) by mouth daily. 10/04/20   Mardella Layman, MD  fluticasone (FLONASE) 50 MCG/ACT nasal spray Place 2 sprays into both nostrils daily as needed for allergies.  10/23/15   [provider]  furosemide (LASIX) 20 MG tablet Take 20 mg by mouth daily.     [provider]  hydrALAZINE (APRESOLINE) 50 MG tablet Take 50 mg by mouth 2 (two) times daily. 05/06/21   [provider]  lanthanum (FOSRENOL) 1000 MG chewable tablet Chew by mouth 2 (two) times daily. 07/12/21   [provider]  loratadine (CLARITIN) 10 MG tablet Take 10 mg by mouth daily.    [provider]  Miconazole Nitrate (ATHLETES FOOT POWDER SPRAY) 2 % AERP Spray between toes twice daily. 07/22/21   Freddie Breech, DPM  tadalafil (CIALIS) 20 MG tablet SMARTSIG:1 Tablet(s) By Mouth Every 3 Days PRN 07/01/21   [provider]    Family History Family History  Problem Relation Age of Onset   Alzheimer's disease Mother    Cirrhosis Father    Heart attack Brother     Social History Social History   Tobacco Use   Smoking status: Former    Packs/day: 0.25    Years: 20.00    Additional pack years: 0.00    Total pack years: 5.00    Types: Cigarettes   Smokeless tobacco: Never  Vaping Use   Vaping Use: Never used  Substance Use Topics   Alcohol use: No    Alcohol/week: 0.0 standard drinks of alcohol   Drug use: No     Allergies   Patient has no known allergies.   Review of Systems Review of Systems Per HPI  Physical Exam Triage Vital Signs ED Triage Vitals  Enc  Vitals Group     BP 12/15/22 1434 136/72     Pulse Rate 12/15/22 1434 82     Resp 12/15/22 1434 18     Temp 12/15/22 1434 98 F (36.7 C)     Temp Source 12/15/22 1434 Oral     SpO2 12/15/22 1434 93 %     Weight --      Height --      Head Circumference --      Peak Flow --      Pain Score 12/15/22 1440 8     Pain Loc --      Pain Edu? --      Excl. in GC? --    No data found.  Updated Vital Signs BP 136/72 (BP Location: Right Arm)  Pulse 82   Temp 98 F (36.7 C) (Oral)   Resp 18   SpO2 95%   Visual Acuity Right Eye Distance:   Left Eye Distance:   Bilateral Distance:    Right Eye Near:   Left Eye Near:    Bilateral Near:     Physical Exam Constitutional:      General: He is not in acute distress.    Appearance: Normal appearance. He is not toxic-appearing or diaphoretic.  HENT:     Head: Normocephalic and atraumatic.  Eyes:     Extraocular Movements: Extraocular movements intact.     Conjunctiva/sclera: Conjunctivae normal.  Pulmonary:     Effort: Pulmonary effort is normal.  Musculoskeletal:     Right knee: No swelling or crepitus. Tenderness present. No LCL laxity, MCL laxity, ACL laxity or PCL laxity. Normal alignment, normal meniscus and normal patellar mobility. Normal pulse.     Comments: Patient has tenderness to palpation to lower anterior knee as well as upper tib-fib area.  No obvious swelling.  Patient also has tenderness to palpation to medial portion of anterior knee.  Tenderness to palpation is also present to the upper calf of the right lower leg.  No obvious swelling noted.  Full range of motion of knee present.  No crepitus noted.  Neurovascularly intact.  Neurological:     General: No focal deficit present.     Mental Status: He is alert and oriented to person, place, and time. Mental status is at baseline.  Psychiatric:        Mood and Affect: Mood normal.        Behavior: Behavior normal.        Thought Content: Thought content normal.         Judgment: Judgment normal.      UC Treatments / Results  Labs (all labs ordered are listed, but only abnormal results are displayed) Labs Reviewed - No data to display  EKG   Radiology DG Knee Complete 4 Views Right  Result Date: 12/15/2022 CLINICAL DATA:  Jumped off the back of his truck and felt shooting pain through his leg which has progressively worsened. EXAM: RIGHT KNEE - COMPLETE 4+ VIEW; RIGHT TIBIA AND FIBULA - 2 VIEW COMPARISON:  None Available. FINDINGS: No acute fracture or dislocation. Small knee joint effusion. No evidence of arthropathy or other focal bone abnormality. Soft tissues are unremarkable. IMPRESSION: No acute fracture or dislocation. Small knee joint effusion. Electronically Signed   By: Minerva Fester M.D.   On: 12/15/2022 15:20   DG Tibia/Fibula Right  Result Date: 12/15/2022 CLINICAL DATA:  Jumped off the back of his truck and felt shooting pain through his leg which has progressively worsened. EXAM: RIGHT KNEE - COMPLETE 4+ VIEW; RIGHT TIBIA AND FIBULA - 2 VIEW COMPARISON:  None Available. FINDINGS: No acute fracture or dislocation. Small knee joint effusion. No evidence of arthropathy or other focal bone abnormality. Soft tissues are unremarkable. IMPRESSION: No acute fracture or dislocation. Small knee joint effusion. Electronically Signed   By: Minerva Fester M.D.   On: 12/15/2022 15:20    Procedures Procedures (including critical care time)  Medications Ordered in UC Medications - No data to display  Initial Impression / Assessment and Plan / UC Course  I have reviewed the triage vital signs and the nursing notes.  Pertinent labs & imaging results that were available during my care of the patient were reviewed by me and considered in my medical decision making (see  chart for details).     X-ray of the knee and tibia/fibula were negative for any acute bony abnormality.  Small joint effusion noted in the right knee which could be due to acute  trauma or degenerative changes.  I am mildly concerned about ligamentous injury despite no laxity present.  Therefore, will apply knee brace for support and stability and crutches were supplied.  Advised nonweightbearing until otherwise by orthopedist.  Advised patient he will need to see orthopedist for further evaluation and management and was provided with contact information.  Advised supportive care, elevation of extremity, ice application.  Advised to not sleep in knee brace.  Advised strict return precautions.  Patient verbalized understanding and was agreeable with plan. Final Clinical Impressions(s) / UC Diagnoses   Final diagnoses:  Acute pain of right knee     Discharge Instructions      There is a small amount of fluid within your knee which could be due to current injury. Do not sleep in this.  Nonweightbearing until otherwise advised by orthopedist.  Follow-up with orthopedist for further evaluation and management.     ED Prescriptions   None    PDMP not reviewed this encounter.   Gustavus Bryant, [place] 12/15/22 1611    Gustavus Bryant, [place] 01/16/23 [phone]

## 2022-12-15 NOTE — ED Notes (Signed)
Pt was given knee brace and crutches for right knee injury, instructed patient he is not to bare any weight on injured knee or rest on that side until evaluated by Ortho specialist. Pt has verbalized understanding.

## 2022-12-15 NOTE — Discharge Instructions (Addendum)
There is a small amount of fluid within your knee which could be due to current injury. Do not sleep in this.  Nonweightbearing until otherwise advised by orthopedist.  Follow-up with orthopedist for further evaluation and management.

## 2022-12-15 NOTE — ED Triage Notes (Signed)
Pt c/o right knee pain, pt jumped down off the back of his truck playing with his grandchildren and felt pain shoot through his leg and has gotten progressively worse throughout the night

## 2022-12-16 DIAGNOSIS — N186 End stage renal disease: Secondary | ICD-10-CM | POA: Diagnosis not present

## 2022-12-16 DIAGNOSIS — I129 Hypertensive chronic kidney disease with stage 1 through stage 4 chronic kidney disease, or unspecified chronic kidney disease: Secondary | ICD-10-CM | POA: Diagnosis not present

## 2022-12-16 DIAGNOSIS — N2581 Secondary hyperparathyroidism of renal origin: Secondary | ICD-10-CM | POA: Diagnosis not present

## 2022-12-16 DIAGNOSIS — Z992 Dependence on renal dialysis: Secondary | ICD-10-CM | POA: Diagnosis not present

## 2022-12-18 DIAGNOSIS — N186 End stage renal disease: Secondary | ICD-10-CM | POA: Diagnosis not present

## 2022-12-18 DIAGNOSIS — Z992 Dependence on renal dialysis: Secondary | ICD-10-CM | POA: Diagnosis not present

## 2022-12-18 DIAGNOSIS — N2581 Secondary hyperparathyroidism of renal origin: Secondary | ICD-10-CM | POA: Diagnosis not present

## 2022-12-20 DIAGNOSIS — N2581 Secondary hyperparathyroidism of renal origin: Secondary | ICD-10-CM | POA: Diagnosis not present

## 2022-12-20 DIAGNOSIS — Z992 Dependence on renal dialysis: Secondary | ICD-10-CM | POA: Diagnosis not present

## 2022-12-20 DIAGNOSIS — N186 End stage renal disease: Secondary | ICD-10-CM | POA: Diagnosis not present

## 2022-12-23 ENCOUNTER — Other Ambulatory Visit: Payer: Self-pay | Admitting: *Deleted

## 2022-12-23 DIAGNOSIS — N2581 Secondary hyperparathyroidism of renal origin: Secondary | ICD-10-CM | POA: Diagnosis not present

## 2022-12-23 DIAGNOSIS — Z992 Dependence on renal dialysis: Secondary | ICD-10-CM | POA: Diagnosis not present

## 2022-12-23 DIAGNOSIS — M79661 Pain in right lower leg: Secondary | ICD-10-CM | POA: Diagnosis not present

## 2022-12-23 DIAGNOSIS — M79604 Pain in right leg: Secondary | ICD-10-CM | POA: Diagnosis not present

## 2022-12-23 DIAGNOSIS — M79603 Pain in arm, unspecified: Secondary | ICD-10-CM

## 2022-12-23 DIAGNOSIS — N186 End stage renal disease: Secondary | ICD-10-CM | POA: Diagnosis not present

## 2022-12-23 DIAGNOSIS — M25561 Pain in right knee: Secondary | ICD-10-CM | POA: Diagnosis not present

## 2022-12-25 DIAGNOSIS — N186 End stage renal disease: Secondary | ICD-10-CM | POA: Diagnosis not present

## 2022-12-25 DIAGNOSIS — Z992 Dependence on renal dialysis: Secondary | ICD-10-CM | POA: Diagnosis not present

## 2022-12-25 DIAGNOSIS — N2581 Secondary hyperparathyroidism of renal origin: Secondary | ICD-10-CM | POA: Diagnosis not present

## 2022-12-27 DIAGNOSIS — Z992 Dependence on renal dialysis: Secondary | ICD-10-CM | POA: Diagnosis not present

## 2022-12-27 DIAGNOSIS — N186 End stage renal disease: Secondary | ICD-10-CM | POA: Diagnosis not present

## 2022-12-27 DIAGNOSIS — N2581 Secondary hyperparathyroidism of renal origin: Secondary | ICD-10-CM | POA: Diagnosis not present

## 2022-12-30 DIAGNOSIS — N2581 Secondary hyperparathyroidism of renal origin: Secondary | ICD-10-CM | POA: Diagnosis not present

## 2022-12-30 DIAGNOSIS — Z992 Dependence on renal dialysis: Secondary | ICD-10-CM | POA: Diagnosis not present

## 2022-12-30 DIAGNOSIS — N186 End stage renal disease: Secondary | ICD-10-CM | POA: Diagnosis not present

## 2022-12-31 NOTE — Progress Notes (Signed)
Office Note     CC:  Steal syndrome Requesting Provider:  Bufford Buttner, MD  HPI: [name] Downs is a 57 y.o. (Nov 13, 1965) male presenting at the request of .[name] Rakers, MD with concern for steal syndrome. Pt with history of left first stage basilic vein transposition 11/23/19 by Dr. Arbie Downs.  On exam today, [name] was doing well, accompanied by his wife. [name] Downs has noted waxing and waning to some tingling in the left and right hands during dialysis.  He states this occurs when his arms are extended to 45 to 90 degrees.  When his arms are by his side, he does not have any issues.  [name] Downs also appreciates some numbness in the left arm at night when sleeping.  He likes to sleep on the side.  And if he is not sleeping on the side, his daughter sleeps on his left arm.  If he sleeps with his arms by his side, and sleeps on his back, he does not have any problems, and has no left arm symptoms.  [name] Downs denies ischemic rest pain, tissue loss.  He is able to mow his grass and perform all other activities without numbness, tingling, pain.   Past Medical History:  Diagnosis Date   Aortic dissection (HCC)    type B   Brain aneurysm    Chronic kidney disease    Deafness in left ear    Gout    Hypertension    Medically noncompliant    Sleep apnea     does not wears CPAP   Wears glasses     Past Surgical History:  Procedure Laterality Date   AV FISTULA PLACEMENT Right 12/17/2015   Procedure: CREATION OF RADIOCEPHALIC  ARTERIOVENOUS (AV) FISTULA RIGHT LOWER  ARM;  Surgeon: [name] Kerns, MD;  Location: [facility] OR;  Service: Vascular;  Laterality: Right;   AV FISTULA PLACEMENT Right 12/17/2015   Procedure: CREATION OF RIGHT UPPER ARM  BRACHIO CEPHALIC ARTERIOVENOUS (AV) FISTULA;  Surgeon: [name] Kerns, MD;  Location: [facility] OR;  Service: Vascular;  Laterality: Right;   AV FISTULA PLACEMENT Left 09/08/2017   Procedure: ARTERIOVENOUS (AV) FISTULA CREATION LEFT BRACHIOCEPHALIC;  Surgeon: [name] Kerns,  MD;  Location: [facility] OR;  Service: Vascular;  Laterality: Left;   AV FISTULA PLACEMENT Left 11/23/2019   Procedure: LEFT ARM BASILIC VEIN (AV) TRANSPOSITION;  Surgeon: [name] Earthly, MD;  Location: [facility] OR;  Service: Vascular;  Laterality: Left;   BACK SURGERY      Social History   Socioeconomic History   Marital status: Married    Spouse name: Not on file   Number of children: Not on file   Years of education: Not on file   Highest education level: Not on file  Occupational History   Not on file  Tobacco Use   Smoking status: Former    Packs/day: 0.25    Years: 20.00    Additional pack years: 0.00    Total pack years: 5.00    Types: Cigarettes   Smokeless tobacco: Never  Vaping Use   Vaping Use: Never used  Substance and Sexual Activity   Alcohol use: No    Alcohol/week: 0.0 standard drinks of alcohol   Drug use: No   Sexual activity: Yes  Other Topics Concern   Not on file  Social History Narrative   Not on file   Social Determinants of Health   Financial Resource Strain: Not on file  Food Insecurity: Not on file  Transportation Needs: Not on  file  Physical Activity: Not on file  Stress: Not on file  Social Connections: Not on file  Intimate Partner Violence: Not on file   Family History  Problem Relation Age of Onset   Alzheimer's disease Mother    Cirrhosis Father    Heart attack Brother     Current Outpatient Medications  Medication Sig Dispense Refill   allopurinol (ZYLOPRIM) 100 MG tablet Take 100 mg by mouth in the morning, at noon, in the evening, and at bedtime.      amLODipine (NORVASC) 10 MG tablet Take 10 mg by mouth at bedtime.      calcitRIOL (ROCALTROL) 0.25 MCG capsule Take 0.25 mcg by mouth daily.      cinacalcet (SENSIPAR) 30 MG tablet SMARTSIG:1 Tablet(s) By Mouth Every Evening     dextromethorphan-guaiFENesin (MUCINEX DM) 30-600 MG 12hr tablet Take 1 tablet by mouth 2 (two) times daily. 30 tablet 0   esomeprazole (NEXIUM) 40 MG capsule Take 1  capsule (40 mg total) by mouth daily. 14 capsule 0   fluticasone (FLONASE) 50 MCG/ACT nasal spray Place 2 sprays into both nostrils daily as needed for allergies.   1   furosemide (LASIX) 20 MG tablet Take 20 mg by mouth daily.      hydrALAZINE (APRESOLINE) 50 MG tablet Take 50 mg by mouth 2 (two) times daily.     lanthanum (FOSRENOL) 1000 MG chewable tablet Chew by mouth 2 (two) times daily.     loratadine (CLARITIN) 10 MG tablet Take 10 mg by mouth daily.     Miconazole Nitrate (ATHLETES FOOT POWDER SPRAY) 2 % AERP Spray between toes twice daily. 130 g 3   tadalafil (CIALIS) 20 MG tablet SMARTSIG:1 Tablet(s) By Mouth Every 3 Days PRN     No current facility-administered medications for this visit.    No Known Allergies   REVIEW OF SYSTEMS:  [X]  denotes positive finding, [ ]  denotes negative finding Cardiac  Comments:  Chest pain or chest pressure:    Shortness of breath upon exertion:    Short of breath when lying flat:    Irregular heart rhythm:        Vascular    Pain in calf, thigh, or hip brought on by ambulation:    Pain in feet at night that wakes you up from your sleep:     Blood clot in your veins:    Leg swelling:         Pulmonary    Oxygen at home:    Productive cough:     Wheezing:         Neurologic    Sudden weakness in arms or legs:     Sudden numbness in arms or legs:     Sudden onset of difficulty speaking or slurred speech:    Temporary loss of vision in one eye:     Problems with dizziness:         Gastrointestinal    Blood in stool:     Vomited blood:         Genitourinary    Burning when urinating:     Blood in urine:        Psychiatric    Major depression:         Hematologic    Bleeding problems:    Problems with blood clotting too easily:        Skin    Rashes or ulcers:        Constitutional  Fever or chills:      PHYSICAL EXAMINATION:  There were no vitals filed for this visit.  General:  WDWN in NAD; vital signs  documented above Gait: Not observed HENT: WNL, normocephalic Pulmonary: normal non-labored breathing , without wheezing Cardiac: regular HR Abdomen: soft, NT, no masses Skin: without rashes Vascular Exam/Pulses:  Right Left  Radial 2+ (normal) 2+ (normal)                       Extremities: without ischemic changes, without Gangrene , without cellulitis; without open wounds;  Radial pulse Musculoskeletal: no muscle wasting or atrophy  Neurologic: A&O X 3;  No focal weakness or paresthesias are detected Psychiatric:  The pt has Normal affect.   Non-Invasive Vascular Imaging:   Normal change in radial pressure with compression of the fistula.    ASSESSMENT/PLAN: Danzell Wesling is a 57 y.o. male presenting with this and tingling in bilateral hands during dialysis.  This is positional, and when his arms are lowered below 45 degrees, he does not have these problems.  It also appreciates numbness in the left arm which is positional at night.  He notes it mainly when his daughter sleeping on his left arm, or when he is sleeping on his left arm.  On physical exam, he had an excellent thrill within his fistula, with an excellent pulse at the wrist.  As above or not consistent with steal syndrome.  While individuals can have numbness and tingling in the ipsilateral limb while on dialysis, this should not affect the contralateral hand.  Furthermore, the numbness and tingling he describes in his left hand appears to be positional at night, when sleeping on his arm, or if his daughter is sleeping on his arm.  Regard does not appreciate any numbness when lying flat, with his arms by his side.  He denies ischemic rest pain, tissue loss.  A long conversation with him regarding the above he has had multiple failed fistulas and has very few options left.  At the age of 40, he would benefit from continued dialysis without any intervention to the left arm brachiobasilic fistula unless absolutely  necessary.  I asked that he continue to use a stress ball while at dialysis, and find a comfortable position.  Should his numbness and tingling progressed to rest pain, that is present at all times, tissue loss at the fingertips, we would discuss invention.  Damarquis can follow-up with me as needed.   Victorino Sparrow, MD [facility] [phone]

## 2023-01-01 DIAGNOSIS — Z992 Dependence on renal dialysis: Secondary | ICD-10-CM | POA: Diagnosis not present

## 2023-01-01 DIAGNOSIS — N186 End stage renal disease: Secondary | ICD-10-CM | POA: Diagnosis not present

## 2023-01-01 DIAGNOSIS — N2581 Secondary hyperparathyroidism of renal origin: Secondary | ICD-10-CM | POA: Diagnosis not present

## 2023-01-02 ENCOUNTER — Ambulatory Visit (INDEPENDENT_AMBULATORY_CARE_PROVIDER_SITE_OTHER): Payer: Medicare HMO | Admitting: Vascular Surgery

## 2023-01-02 ENCOUNTER — Ambulatory Visit (HOSPITAL_COMMUNITY)
Admission: RE | Admit: 2023-01-02 | Discharge: 2023-01-02 | Disposition: A | Discharge status: Home / Self Care | Payer: Medicare HMO | Source: Ambulatory Visit | Attending: Vascular Surgery | Admitting: Vascular Surgery

## 2023-01-02 ENCOUNTER — Encounter: Payer: Self-pay | Admitting: Vascular Surgery

## 2023-01-02 VITALS — BP 104/71 | HR 70 | Temp 97.8°F | Resp 16 | Ht 72.0 in | Wt 245.0 lb

## 2023-01-02 DIAGNOSIS — R202 Paresthesia of skin: Secondary | ICD-10-CM

## 2023-01-02 DIAGNOSIS — M79602 Pain in left arm: Secondary | ICD-10-CM | POA: Diagnosis not present

## 2023-01-02 DIAGNOSIS — M79603 Pain in arm, unspecified: Secondary | ICD-10-CM | POA: Insufficient documentation

## 2023-01-02 DIAGNOSIS — R200 Anesthesia of skin: Secondary | ICD-10-CM | POA: Diagnosis not present

## 2023-01-03 DIAGNOSIS — Z992 Dependence on renal dialysis: Secondary | ICD-10-CM | POA: Diagnosis not present

## 2023-01-03 DIAGNOSIS — N2581 Secondary hyperparathyroidism of renal origin: Secondary | ICD-10-CM | POA: Diagnosis not present

## 2023-01-03 DIAGNOSIS — N186 End stage renal disease: Secondary | ICD-10-CM | POA: Diagnosis not present

## 2023-01-06 DIAGNOSIS — N186 End stage renal disease: Secondary | ICD-10-CM | POA: Diagnosis not present

## 2023-01-06 DIAGNOSIS — N2581 Secondary hyperparathyroidism of renal origin: Secondary | ICD-10-CM | POA: Diagnosis not present

## 2023-01-06 DIAGNOSIS — Z992 Dependence on renal dialysis: Secondary | ICD-10-CM | POA: Diagnosis not present

## 2023-01-08 DIAGNOSIS — N186 End stage renal disease: Secondary | ICD-10-CM | POA: Diagnosis not present

## 2023-01-08 DIAGNOSIS — Z992 Dependence on renal dialysis: Secondary | ICD-10-CM | POA: Diagnosis not present

## 2023-01-08 DIAGNOSIS — N2581 Secondary hyperparathyroidism of renal origin: Secondary | ICD-10-CM | POA: Diagnosis not present

## 2023-01-09 DIAGNOSIS — I10 Essential (primary) hypertension: Secondary | ICD-10-CM | POA: Diagnosis not present

## 2023-01-09 DIAGNOSIS — N185 Chronic kidney disease, stage 5: Secondary | ICD-10-CM | POA: Diagnosis not present

## 2023-01-09 DIAGNOSIS — I9581 Postprocedural hypotension: Secondary | ICD-10-CM | POA: Diagnosis not present

## 2023-01-09 DIAGNOSIS — E782 Mixed hyperlipidemia: Secondary | ICD-10-CM | POA: Diagnosis not present

## 2023-01-09 DIAGNOSIS — D631 Anemia in chronic kidney disease: Secondary | ICD-10-CM | POA: Diagnosis not present

## 2023-01-10 DIAGNOSIS — Z992 Dependence on renal dialysis: Secondary | ICD-10-CM | POA: Diagnosis not present

## 2023-01-10 DIAGNOSIS — N2581 Secondary hyperparathyroidism of renal origin: Secondary | ICD-10-CM | POA: Diagnosis not present

## 2023-01-10 DIAGNOSIS — N186 End stage renal disease: Secondary | ICD-10-CM | POA: Diagnosis not present

## 2023-01-13 DIAGNOSIS — Z992 Dependence on renal dialysis: Secondary | ICD-10-CM | POA: Diagnosis not present

## 2023-01-13 DIAGNOSIS — N2581 Secondary hyperparathyroidism of renal origin: Secondary | ICD-10-CM | POA: Diagnosis not present

## 2023-01-13 DIAGNOSIS — N186 End stage renal disease: Secondary | ICD-10-CM | POA: Diagnosis not present

## 2023-01-14 IMAGING — CR DG KNEE 1-2V*L*
2 series · 2 of 2 positions shown · non-contrast
Comparison: X-ray 03/14/2009.

CLINICAL DATA: Left knee pain. Patient reports weakness, numbness
and throbbing at night and first steps in the morning.

EXAM:
LEFT KNEE - 1-2 VIEW

[w knee ap left]
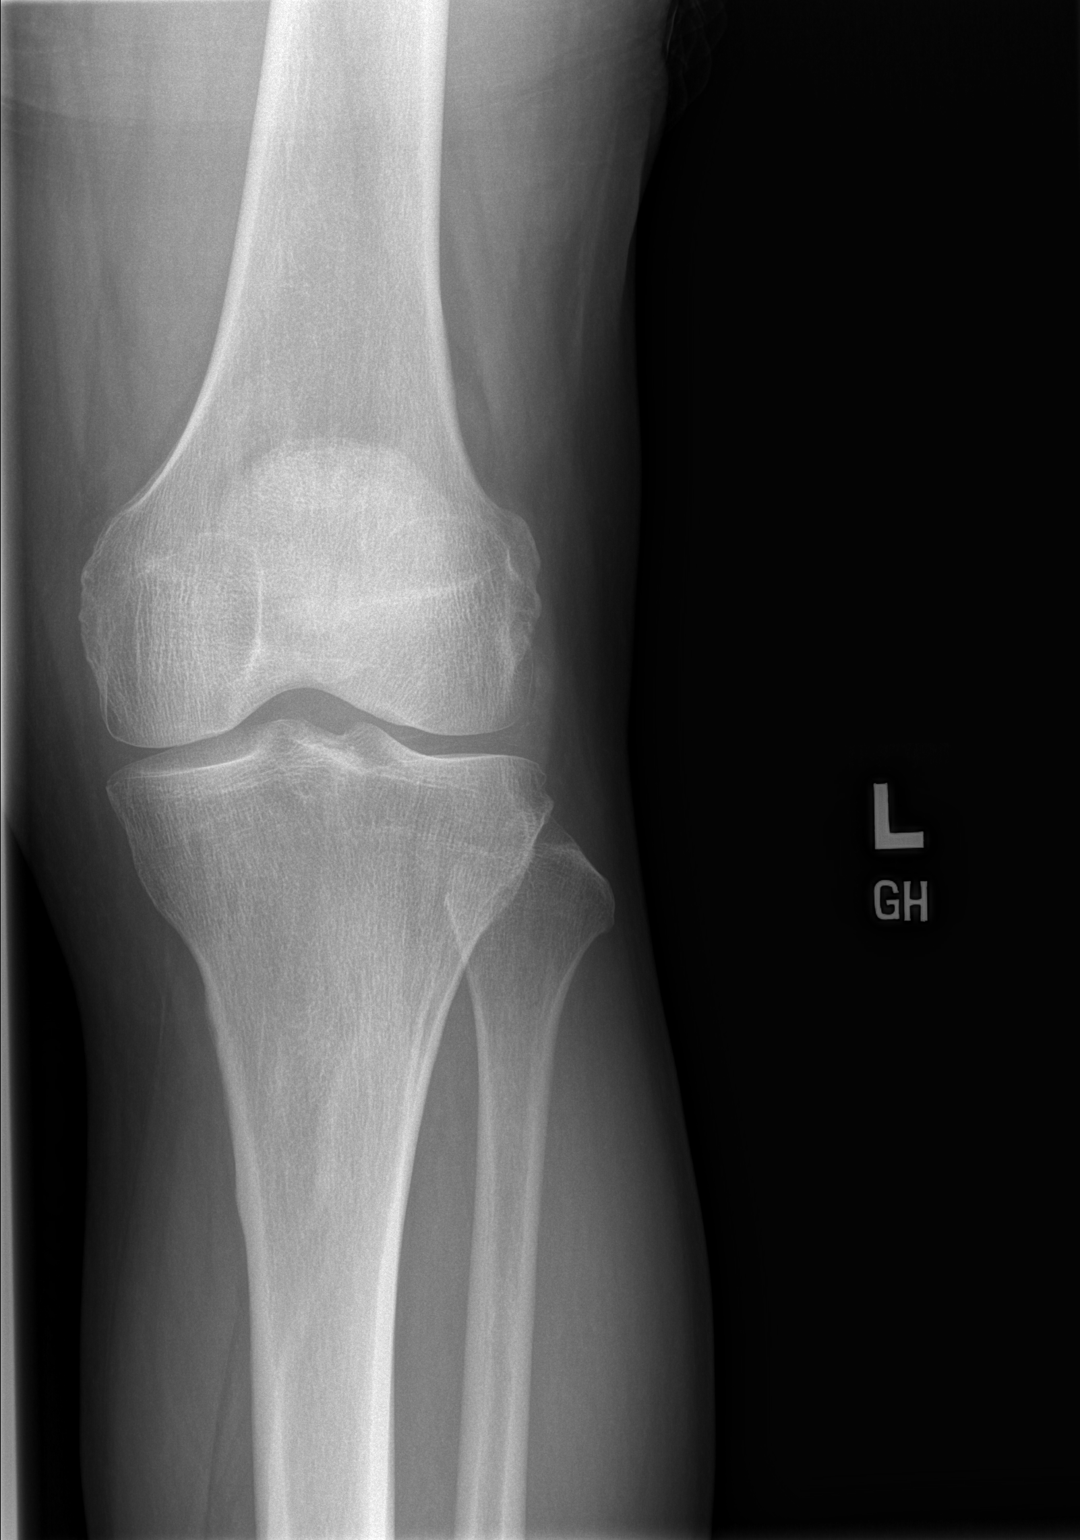

[w knee lat. left]
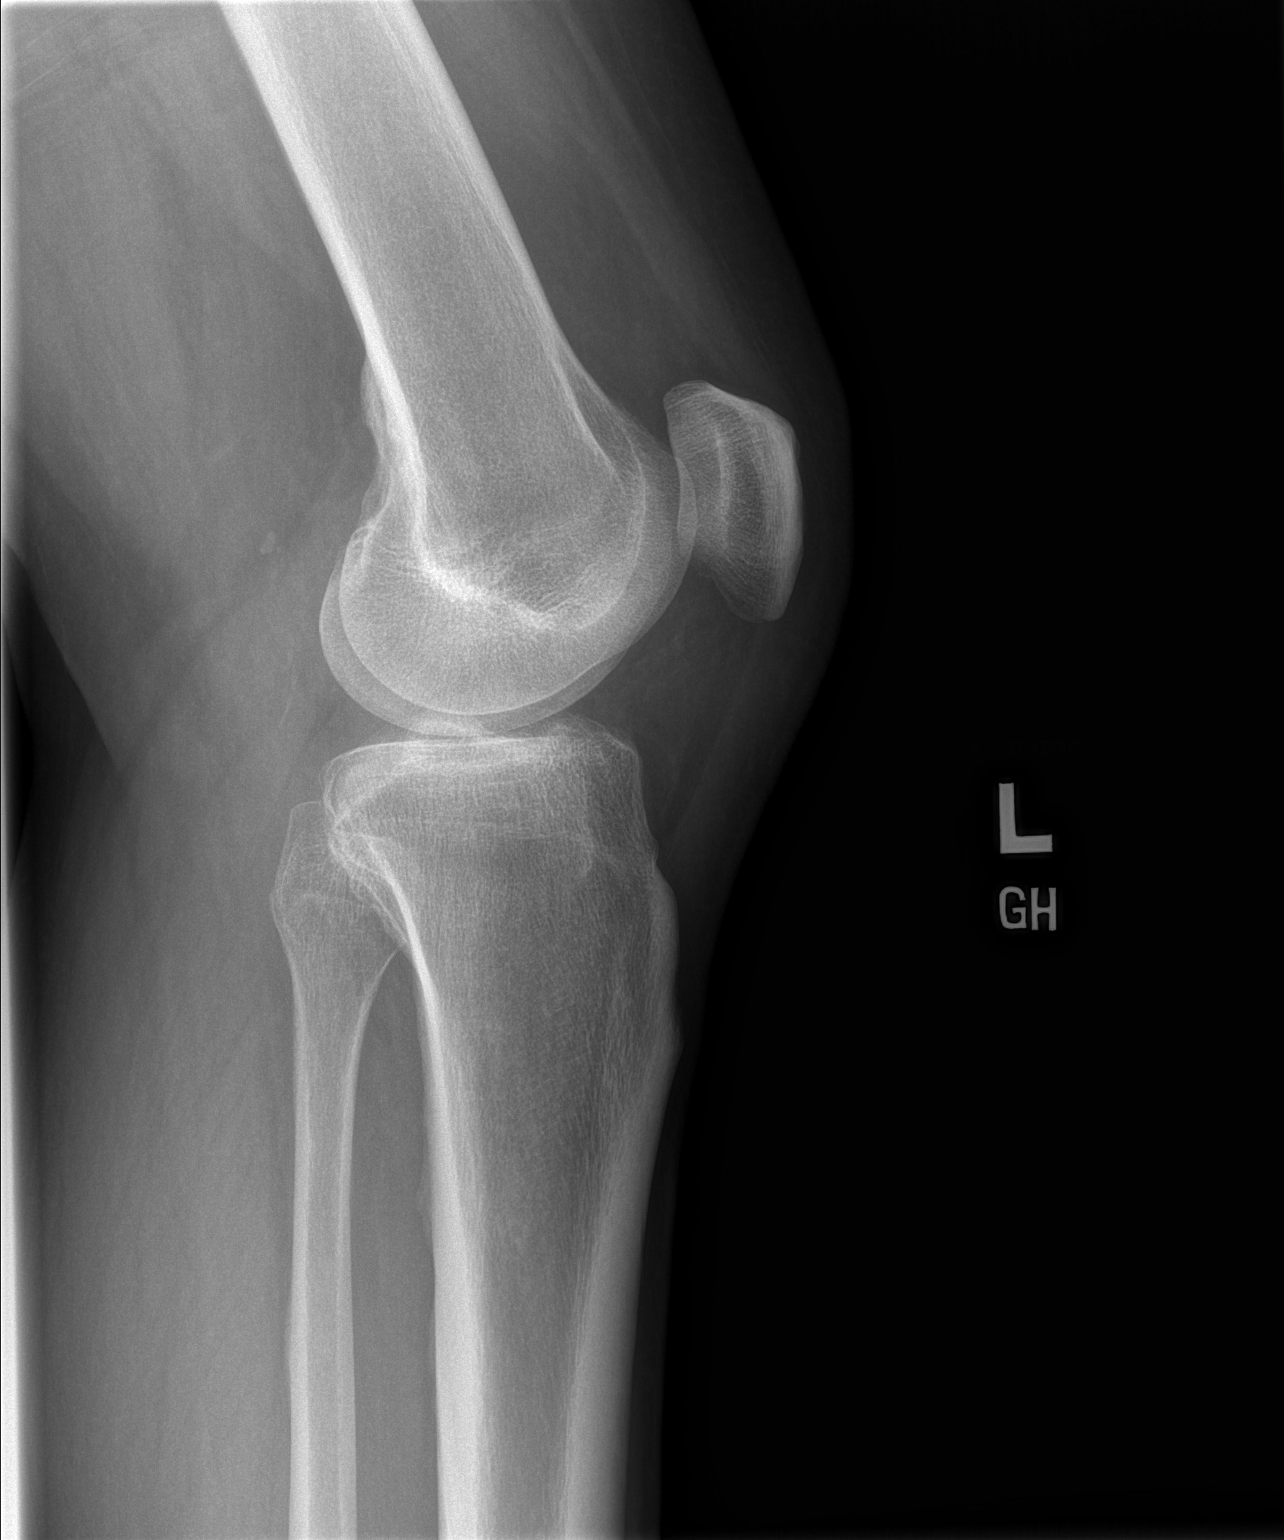

[2 of 2 positions shown; findings below may reference images not displayed]

FINDINGS: No evidence of fracture, dislocation, or joint effusion. No evidence
of arthropathy or other focal bone abnormality. Soft tissues are
unremarkable.
IMPRESSION: Negative.

## 2023-01-15 DIAGNOSIS — N186 End stage renal disease: Secondary | ICD-10-CM | POA: Diagnosis not present

## 2023-01-15 DIAGNOSIS — N2581 Secondary hyperparathyroidism of renal origin: Secondary | ICD-10-CM | POA: Diagnosis not present

## 2023-01-15 DIAGNOSIS — Z992 Dependence on renal dialysis: Secondary | ICD-10-CM | POA: Diagnosis not present

## 2023-01-16 DIAGNOSIS — Z992 Dependence on renal dialysis: Secondary | ICD-10-CM | POA: Diagnosis not present

## 2023-01-16 DIAGNOSIS — I129 Hypertensive chronic kidney disease with stage 1 through stage 4 chronic kidney disease, or unspecified chronic kidney disease: Secondary | ICD-10-CM | POA: Diagnosis not present

## 2023-01-16 DIAGNOSIS — N186 End stage renal disease: Secondary | ICD-10-CM | POA: Diagnosis not present

## 2023-01-17 DIAGNOSIS — N2581 Secondary hyperparathyroidism of renal origin: Secondary | ICD-10-CM | POA: Diagnosis not present

## 2023-01-17 DIAGNOSIS — N186 End stage renal disease: Secondary | ICD-10-CM | POA: Diagnosis not present

## 2023-01-17 DIAGNOSIS — Z992 Dependence on renal dialysis: Secondary | ICD-10-CM | POA: Diagnosis not present

## 2023-01-20 DIAGNOSIS — N186 End stage renal disease: Secondary | ICD-10-CM | POA: Diagnosis not present

## 2023-01-20 DIAGNOSIS — Z992 Dependence on renal dialysis: Secondary | ICD-10-CM | POA: Diagnosis not present

## 2023-01-20 DIAGNOSIS — N2581 Secondary hyperparathyroidism of renal origin: Secondary | ICD-10-CM | POA: Diagnosis not present

## 2023-01-22 DIAGNOSIS — Z992 Dependence on renal dialysis: Secondary | ICD-10-CM | POA: Diagnosis not present

## 2023-01-22 DIAGNOSIS — N2581 Secondary hyperparathyroidism of renal origin: Secondary | ICD-10-CM | POA: Diagnosis not present

## 2023-01-22 DIAGNOSIS — N186 End stage renal disease: Secondary | ICD-10-CM | POA: Diagnosis not present

## 2023-01-24 DIAGNOSIS — Z992 Dependence on renal dialysis: Secondary | ICD-10-CM | POA: Diagnosis not present

## 2023-01-24 DIAGNOSIS — N2581 Secondary hyperparathyroidism of renal origin: Secondary | ICD-10-CM | POA: Diagnosis not present

## 2023-01-24 DIAGNOSIS — N186 End stage renal disease: Secondary | ICD-10-CM | POA: Diagnosis not present

## 2023-01-27 DIAGNOSIS — N2581 Secondary hyperparathyroidism of renal origin: Secondary | ICD-10-CM | POA: Diagnosis not present

## 2023-01-27 DIAGNOSIS — Z992 Dependence on renal dialysis: Secondary | ICD-10-CM | POA: Diagnosis not present

## 2023-01-27 DIAGNOSIS — N186 End stage renal disease: Secondary | ICD-10-CM | POA: Diagnosis not present

## 2023-01-29 DIAGNOSIS — N186 End stage renal disease: Secondary | ICD-10-CM | POA: Diagnosis not present

## 2023-01-29 DIAGNOSIS — N2581 Secondary hyperparathyroidism of renal origin: Secondary | ICD-10-CM | POA: Diagnosis not present

## 2023-01-29 DIAGNOSIS — Z992 Dependence on renal dialysis: Secondary | ICD-10-CM | POA: Diagnosis not present

## 2023-01-31 DIAGNOSIS — N2581 Secondary hyperparathyroidism of renal origin: Secondary | ICD-10-CM | POA: Diagnosis not present

## 2023-01-31 DIAGNOSIS — Z992 Dependence on renal dialysis: Secondary | ICD-10-CM | POA: Diagnosis not present

## 2023-01-31 DIAGNOSIS — N186 End stage renal disease: Secondary | ICD-10-CM | POA: Diagnosis not present

## 2023-02-03 DIAGNOSIS — N2581 Secondary hyperparathyroidism of renal origin: Secondary | ICD-10-CM | POA: Diagnosis not present

## 2023-02-03 DIAGNOSIS — N186 End stage renal disease: Secondary | ICD-10-CM | POA: Diagnosis not present

## 2023-02-03 DIAGNOSIS — Z992 Dependence on renal dialysis: Secondary | ICD-10-CM | POA: Diagnosis not present

## 2023-02-05 DIAGNOSIS — N186 End stage renal disease: Secondary | ICD-10-CM | POA: Diagnosis not present

## 2023-02-05 DIAGNOSIS — N2581 Secondary hyperparathyroidism of renal origin: Secondary | ICD-10-CM | POA: Diagnosis not present

## 2023-02-05 DIAGNOSIS — Z992 Dependence on renal dialysis: Secondary | ICD-10-CM | POA: Diagnosis not present

## 2023-02-07 DIAGNOSIS — N186 End stage renal disease: Secondary | ICD-10-CM | POA: Diagnosis not present

## 2023-02-07 DIAGNOSIS — Z992 Dependence on renal dialysis: Secondary | ICD-10-CM | POA: Diagnosis not present

## 2023-02-07 DIAGNOSIS — N2581 Secondary hyperparathyroidism of renal origin: Secondary | ICD-10-CM | POA: Diagnosis not present

## 2023-02-10 DIAGNOSIS — N2581 Secondary hyperparathyroidism of renal origin: Secondary | ICD-10-CM | POA: Diagnosis not present

## 2023-02-10 DIAGNOSIS — N186 End stage renal disease: Secondary | ICD-10-CM | POA: Diagnosis not present

## 2023-02-10 DIAGNOSIS — Z992 Dependence on renal dialysis: Secondary | ICD-10-CM | POA: Diagnosis not present

## 2023-02-12 DIAGNOSIS — Z992 Dependence on renal dialysis: Secondary | ICD-10-CM | POA: Diagnosis not present

## 2023-02-12 DIAGNOSIS — N2581 Secondary hyperparathyroidism of renal origin: Secondary | ICD-10-CM | POA: Diagnosis not present

## 2023-02-12 DIAGNOSIS — N186 End stage renal disease: Secondary | ICD-10-CM | POA: Diagnosis not present

## 2023-02-14 DIAGNOSIS — Z992 Dependence on renal dialysis: Secondary | ICD-10-CM | POA: Diagnosis not present

## 2023-02-14 DIAGNOSIS — N186 End stage renal disease: Secondary | ICD-10-CM | POA: Diagnosis not present

## 2023-02-14 DIAGNOSIS — N2581 Secondary hyperparathyroidism of renal origin: Secondary | ICD-10-CM | POA: Diagnosis not present

## 2023-02-15 DIAGNOSIS — N186 End stage renal disease: Secondary | ICD-10-CM | POA: Diagnosis not present

## 2023-02-15 DIAGNOSIS — Z992 Dependence on renal dialysis: Secondary | ICD-10-CM | POA: Diagnosis not present

## 2023-02-15 DIAGNOSIS — I129 Hypertensive chronic kidney disease with stage 1 through stage 4 chronic kidney disease, or unspecified chronic kidney disease: Secondary | ICD-10-CM | POA: Diagnosis not present

## 2023-02-17 DIAGNOSIS — Z992 Dependence on renal dialysis: Secondary | ICD-10-CM | POA: Diagnosis not present

## 2023-02-17 DIAGNOSIS — N186 End stage renal disease: Secondary | ICD-10-CM | POA: Diagnosis not present

## 2023-02-17 DIAGNOSIS — N2581 Secondary hyperparathyroidism of renal origin: Secondary | ICD-10-CM | POA: Diagnosis not present

## 2023-02-19 DIAGNOSIS — Z992 Dependence on renal dialysis: Secondary | ICD-10-CM | POA: Diagnosis not present

## 2023-02-19 DIAGNOSIS — N2581 Secondary hyperparathyroidism of renal origin: Secondary | ICD-10-CM | POA: Diagnosis not present

## 2023-02-19 DIAGNOSIS — N186 End stage renal disease: Secondary | ICD-10-CM | POA: Diagnosis not present

## 2023-02-21 DIAGNOSIS — N2581 Secondary hyperparathyroidism of renal origin: Secondary | ICD-10-CM | POA: Diagnosis not present

## 2023-02-21 DIAGNOSIS — Z992 Dependence on renal dialysis: Secondary | ICD-10-CM | POA: Diagnosis not present

## 2023-02-21 DIAGNOSIS — N186 End stage renal disease: Secondary | ICD-10-CM | POA: Diagnosis not present

## 2023-02-24 DIAGNOSIS — N2581 Secondary hyperparathyroidism of renal origin: Secondary | ICD-10-CM | POA: Diagnosis not present

## 2023-02-24 DIAGNOSIS — Z992 Dependence on renal dialysis: Secondary | ICD-10-CM | POA: Diagnosis not present

## 2023-02-24 DIAGNOSIS — N186 End stage renal disease: Secondary | ICD-10-CM | POA: Diagnosis not present

## 2023-02-26 DIAGNOSIS — N2581 Secondary hyperparathyroidism of renal origin: Secondary | ICD-10-CM | POA: Diagnosis not present

## 2023-02-26 DIAGNOSIS — Z992 Dependence on renal dialysis: Secondary | ICD-10-CM | POA: Diagnosis not present

## 2023-02-26 DIAGNOSIS — N186 End stage renal disease: Secondary | ICD-10-CM | POA: Diagnosis not present

## 2023-02-28 DIAGNOSIS — Z992 Dependence on renal dialysis: Secondary | ICD-10-CM | POA: Diagnosis not present

## 2023-02-28 DIAGNOSIS — N2581 Secondary hyperparathyroidism of renal origin: Secondary | ICD-10-CM | POA: Diagnosis not present

## 2023-02-28 DIAGNOSIS — N186 End stage renal disease: Secondary | ICD-10-CM | POA: Diagnosis not present

## 2023-03-03 DIAGNOSIS — N186 End stage renal disease: Secondary | ICD-10-CM | POA: Diagnosis not present

## 2023-03-03 DIAGNOSIS — Z992 Dependence on renal dialysis: Secondary | ICD-10-CM | POA: Diagnosis not present

## 2023-03-03 DIAGNOSIS — N2581 Secondary hyperparathyroidism of renal origin: Secondary | ICD-10-CM | POA: Diagnosis not present

## 2023-03-05 DIAGNOSIS — Z992 Dependence on renal dialysis: Secondary | ICD-10-CM | POA: Diagnosis not present

## 2023-03-05 DIAGNOSIS — N186 End stage renal disease: Secondary | ICD-10-CM | POA: Diagnosis not present

## 2023-03-05 DIAGNOSIS — N2581 Secondary hyperparathyroidism of renal origin: Secondary | ICD-10-CM | POA: Diagnosis not present

## 2023-03-07 DIAGNOSIS — N186 End stage renal disease: Secondary | ICD-10-CM | POA: Diagnosis not present

## 2023-03-07 DIAGNOSIS — Z992 Dependence on renal dialysis: Secondary | ICD-10-CM | POA: Diagnosis not present

## 2023-03-07 DIAGNOSIS — N2581 Secondary hyperparathyroidism of renal origin: Secondary | ICD-10-CM | POA: Diagnosis not present

## 2023-03-10 DIAGNOSIS — N186 End stage renal disease: Secondary | ICD-10-CM | POA: Diagnosis not present

## 2023-03-10 DIAGNOSIS — Z992 Dependence on renal dialysis: Secondary | ICD-10-CM | POA: Diagnosis not present

## 2023-03-10 DIAGNOSIS — N2581 Secondary hyperparathyroidism of renal origin: Secondary | ICD-10-CM | POA: Diagnosis not present

## 2023-03-12 DIAGNOSIS — N2581 Secondary hyperparathyroidism of renal origin: Secondary | ICD-10-CM | POA: Diagnosis not present

## 2023-03-12 DIAGNOSIS — Z992 Dependence on renal dialysis: Secondary | ICD-10-CM | POA: Diagnosis not present

## 2023-03-12 DIAGNOSIS — N186 End stage renal disease: Secondary | ICD-10-CM | POA: Diagnosis not present

## 2023-03-14 DIAGNOSIS — N186 End stage renal disease: Secondary | ICD-10-CM | POA: Diagnosis not present

## 2023-03-14 DIAGNOSIS — Z992 Dependence on renal dialysis: Secondary | ICD-10-CM | POA: Diagnosis not present

## 2023-03-14 DIAGNOSIS — N2581 Secondary hyperparathyroidism of renal origin: Secondary | ICD-10-CM | POA: Diagnosis not present

## 2023-03-17 DIAGNOSIS — N186 End stage renal disease: Secondary | ICD-10-CM | POA: Diagnosis not present

## 2023-03-17 DIAGNOSIS — N2581 Secondary hyperparathyroidism of renal origin: Secondary | ICD-10-CM | POA: Diagnosis not present

## 2023-03-17 DIAGNOSIS — Z992 Dependence on renal dialysis: Secondary | ICD-10-CM | POA: Diagnosis not present

## 2023-03-18 DIAGNOSIS — N186 End stage renal disease: Secondary | ICD-10-CM | POA: Diagnosis not present

## 2023-03-18 DIAGNOSIS — Z992 Dependence on renal dialysis: Secondary | ICD-10-CM | POA: Diagnosis not present

## 2023-03-18 DIAGNOSIS — I129 Hypertensive chronic kidney disease with stage 1 through stage 4 chronic kidney disease, or unspecified chronic kidney disease: Secondary | ICD-10-CM | POA: Diagnosis not present

## 2023-03-19 DIAGNOSIS — N186 End stage renal disease: Secondary | ICD-10-CM | POA: Diagnosis not present

## 2023-03-19 DIAGNOSIS — Z992 Dependence on renal dialysis: Secondary | ICD-10-CM | POA: Diagnosis not present

## 2023-03-19 DIAGNOSIS — N2581 Secondary hyperparathyroidism of renal origin: Secondary | ICD-10-CM | POA: Diagnosis not present

## 2023-03-21 DIAGNOSIS — N2581 Secondary hyperparathyroidism of renal origin: Secondary | ICD-10-CM | POA: Diagnosis not present

## 2023-03-21 DIAGNOSIS — Z992 Dependence on renal dialysis: Secondary | ICD-10-CM | POA: Diagnosis not present

## 2023-03-21 DIAGNOSIS — N186 End stage renal disease: Secondary | ICD-10-CM | POA: Diagnosis not present

## 2023-03-24 DIAGNOSIS — Z992 Dependence on renal dialysis: Secondary | ICD-10-CM | POA: Diagnosis not present

## 2023-03-24 DIAGNOSIS — N186 End stage renal disease: Secondary | ICD-10-CM | POA: Diagnosis not present

## 2023-03-24 DIAGNOSIS — N2581 Secondary hyperparathyroidism of renal origin: Secondary | ICD-10-CM | POA: Diagnosis not present

## 2023-03-26 DIAGNOSIS — Z992 Dependence on renal dialysis: Secondary | ICD-10-CM | POA: Diagnosis not present

## 2023-03-26 DIAGNOSIS — N186 End stage renal disease: Secondary | ICD-10-CM | POA: Diagnosis not present

## 2023-03-26 DIAGNOSIS — N2581 Secondary hyperparathyroidism of renal origin: Secondary | ICD-10-CM | POA: Diagnosis not present

## 2023-03-28 DIAGNOSIS — N186 End stage renal disease: Secondary | ICD-10-CM | POA: Diagnosis not present

## 2023-03-28 DIAGNOSIS — Z992 Dependence on renal dialysis: Secondary | ICD-10-CM | POA: Diagnosis not present

## 2023-03-28 DIAGNOSIS — N2581 Secondary hyperparathyroidism of renal origin: Secondary | ICD-10-CM | POA: Diagnosis not present

## 2023-03-31 DIAGNOSIS — Z992 Dependence on renal dialysis: Secondary | ICD-10-CM | POA: Diagnosis not present

## 2023-03-31 DIAGNOSIS — N186 End stage renal disease: Secondary | ICD-10-CM | POA: Diagnosis not present

## 2023-03-31 DIAGNOSIS — N2581 Secondary hyperparathyroidism of renal origin: Secondary | ICD-10-CM | POA: Diagnosis not present

## 2023-04-02 DIAGNOSIS — N2581 Secondary hyperparathyroidism of renal origin: Secondary | ICD-10-CM | POA: Diagnosis not present

## 2023-04-02 DIAGNOSIS — Z992 Dependence on renal dialysis: Secondary | ICD-10-CM | POA: Diagnosis not present

## 2023-04-02 DIAGNOSIS — N186 End stage renal disease: Secondary | ICD-10-CM | POA: Diagnosis not present

## 2023-04-04 DIAGNOSIS — N186 End stage renal disease: Secondary | ICD-10-CM | POA: Diagnosis not present

## 2023-04-04 DIAGNOSIS — Z992 Dependence on renal dialysis: Secondary | ICD-10-CM | POA: Diagnosis not present

## 2023-04-04 DIAGNOSIS — N2581 Secondary hyperparathyroidism of renal origin: Secondary | ICD-10-CM | POA: Diagnosis not present

## 2023-04-07 DIAGNOSIS — N2581 Secondary hyperparathyroidism of renal origin: Secondary | ICD-10-CM | POA: Diagnosis not present

## 2023-04-07 DIAGNOSIS — N186 End stage renal disease: Secondary | ICD-10-CM | POA: Diagnosis not present

## 2023-04-07 DIAGNOSIS — Z992 Dependence on renal dialysis: Secondary | ICD-10-CM | POA: Diagnosis not present

## 2023-04-09 DIAGNOSIS — Z992 Dependence on renal dialysis: Secondary | ICD-10-CM | POA: Diagnosis not present

## 2023-04-09 DIAGNOSIS — N186 End stage renal disease: Secondary | ICD-10-CM | POA: Diagnosis not present

## 2023-04-09 DIAGNOSIS — N2581 Secondary hyperparathyroidism of renal origin: Secondary | ICD-10-CM | POA: Diagnosis not present

## 2023-04-10 DIAGNOSIS — I9581 Postprocedural hypotension: Secondary | ICD-10-CM | POA: Diagnosis not present

## 2023-04-10 DIAGNOSIS — I120 Hypertensive chronic kidney disease with stage 5 chronic kidney disease or end stage renal disease: Secondary | ICD-10-CM | POA: Diagnosis not present

## 2023-04-10 DIAGNOSIS — N186 End stage renal disease: Secondary | ICD-10-CM | POA: Diagnosis not present

## 2023-04-11 DIAGNOSIS — N2581 Secondary hyperparathyroidism of renal origin: Secondary | ICD-10-CM | POA: Diagnosis not present

## 2023-04-11 DIAGNOSIS — Z992 Dependence on renal dialysis: Secondary | ICD-10-CM | POA: Diagnosis not present

## 2023-04-11 DIAGNOSIS — N186 End stage renal disease: Secondary | ICD-10-CM | POA: Diagnosis not present

## 2023-04-13 DIAGNOSIS — N186 End stage renal disease: Secondary | ICD-10-CM | POA: Diagnosis not present

## 2023-04-13 DIAGNOSIS — N2581 Secondary hyperparathyroidism of renal origin: Secondary | ICD-10-CM | POA: Diagnosis not present

## 2023-04-13 DIAGNOSIS — Z992 Dependence on renal dialysis: Secondary | ICD-10-CM | POA: Diagnosis not present

## 2023-04-14 DIAGNOSIS — N2581 Secondary hyperparathyroidism of renal origin: Secondary | ICD-10-CM | POA: Diagnosis not present

## 2023-04-14 DIAGNOSIS — N186 End stage renal disease: Secondary | ICD-10-CM | POA: Diagnosis not present

## 2023-04-14 DIAGNOSIS — Z992 Dependence on renal dialysis: Secondary | ICD-10-CM | POA: Diagnosis not present

## 2023-04-15 ENCOUNTER — Ambulatory Visit: Payer: Medicare HMO

## 2023-04-16 DIAGNOSIS — N2581 Secondary hyperparathyroidism of renal origin: Secondary | ICD-10-CM | POA: Diagnosis not present

## 2023-04-16 DIAGNOSIS — N186 End stage renal disease: Secondary | ICD-10-CM | POA: Diagnosis not present

## 2023-04-16 DIAGNOSIS — Z992 Dependence on renal dialysis: Secondary | ICD-10-CM | POA: Diagnosis not present

## 2023-04-18 DIAGNOSIS — Z992 Dependence on renal dialysis: Secondary | ICD-10-CM | POA: Diagnosis not present

## 2023-04-18 DIAGNOSIS — N186 End stage renal disease: Secondary | ICD-10-CM | POA: Diagnosis not present

## 2023-04-18 DIAGNOSIS — I129 Hypertensive chronic kidney disease with stage 1 through stage 4 chronic kidney disease, or unspecified chronic kidney disease: Secondary | ICD-10-CM | POA: Diagnosis not present

## 2023-04-21 DIAGNOSIS — Z992 Dependence on renal dialysis: Secondary | ICD-10-CM | POA: Diagnosis not present

## 2023-04-21 DIAGNOSIS — N2581 Secondary hyperparathyroidism of renal origin: Secondary | ICD-10-CM | POA: Diagnosis not present

## 2023-04-21 DIAGNOSIS — N186 End stage renal disease: Secondary | ICD-10-CM | POA: Diagnosis not present

## 2023-04-23 DIAGNOSIS — N186 End stage renal disease: Secondary | ICD-10-CM | POA: Diagnosis not present

## 2023-04-23 DIAGNOSIS — Z992 Dependence on renal dialysis: Secondary | ICD-10-CM | POA: Diagnosis not present

## 2023-04-23 DIAGNOSIS — N2581 Secondary hyperparathyroidism of renal origin: Secondary | ICD-10-CM | POA: Diagnosis not present

## 2023-04-25 DIAGNOSIS — N2581 Secondary hyperparathyroidism of renal origin: Secondary | ICD-10-CM | POA: Diagnosis not present

## 2023-04-25 DIAGNOSIS — Z992 Dependence on renal dialysis: Secondary | ICD-10-CM | POA: Diagnosis not present

## 2023-04-25 DIAGNOSIS — N186 End stage renal disease: Secondary | ICD-10-CM | POA: Diagnosis not present

## 2023-04-28 DIAGNOSIS — Z992 Dependence on renal dialysis: Secondary | ICD-10-CM | POA: Diagnosis not present

## 2023-04-28 DIAGNOSIS — N186 End stage renal disease: Secondary | ICD-10-CM | POA: Diagnosis not present

## 2023-04-28 DIAGNOSIS — N2581 Secondary hyperparathyroidism of renal origin: Secondary | ICD-10-CM | POA: Diagnosis not present

## 2023-04-30 DIAGNOSIS — N186 End stage renal disease: Secondary | ICD-10-CM | POA: Diagnosis not present

## 2023-04-30 DIAGNOSIS — Z992 Dependence on renal dialysis: Secondary | ICD-10-CM | POA: Diagnosis not present

## 2023-04-30 DIAGNOSIS — N2581 Secondary hyperparathyroidism of renal origin: Secondary | ICD-10-CM | POA: Diagnosis not present

## 2023-05-02 DIAGNOSIS — N2581 Secondary hyperparathyroidism of renal origin: Secondary | ICD-10-CM | POA: Diagnosis not present

## 2023-05-02 DIAGNOSIS — Z992 Dependence on renal dialysis: Secondary | ICD-10-CM | POA: Diagnosis not present

## 2023-05-02 DIAGNOSIS — N186 End stage renal disease: Secondary | ICD-10-CM | POA: Diagnosis not present

## 2023-05-05 DIAGNOSIS — Z992 Dependence on renal dialysis: Secondary | ICD-10-CM | POA: Diagnosis not present

## 2023-05-05 DIAGNOSIS — N2581 Secondary hyperparathyroidism of renal origin: Secondary | ICD-10-CM | POA: Diagnosis not present

## 2023-05-05 DIAGNOSIS — N186 End stage renal disease: Secondary | ICD-10-CM | POA: Diagnosis not present

## 2023-05-07 DIAGNOSIS — N186 End stage renal disease: Secondary | ICD-10-CM | POA: Diagnosis not present

## 2023-05-07 DIAGNOSIS — Z992 Dependence on renal dialysis: Secondary | ICD-10-CM | POA: Diagnosis not present

## 2023-05-07 DIAGNOSIS — N2581 Secondary hyperparathyroidism of renal origin: Secondary | ICD-10-CM | POA: Diagnosis not present

## 2023-05-09 DIAGNOSIS — Z992 Dependence on renal dialysis: Secondary | ICD-10-CM | POA: Diagnosis not present

## 2023-05-09 DIAGNOSIS — N2581 Secondary hyperparathyroidism of renal origin: Secondary | ICD-10-CM | POA: Diagnosis not present

## 2023-05-09 DIAGNOSIS — N186 End stage renal disease: Secondary | ICD-10-CM | POA: Diagnosis not present

## 2023-05-12 DIAGNOSIS — Z992 Dependence on renal dialysis: Secondary | ICD-10-CM | POA: Diagnosis not present

## 2023-05-12 DIAGNOSIS — N186 End stage renal disease: Secondary | ICD-10-CM | POA: Diagnosis not present

## 2023-05-12 DIAGNOSIS — N2581 Secondary hyperparathyroidism of renal origin: Secondary | ICD-10-CM | POA: Diagnosis not present

## 2023-05-14 DIAGNOSIS — Z992 Dependence on renal dialysis: Secondary | ICD-10-CM | POA: Diagnosis not present

## 2023-05-14 DIAGNOSIS — N2581 Secondary hyperparathyroidism of renal origin: Secondary | ICD-10-CM | POA: Diagnosis not present

## 2023-05-14 DIAGNOSIS — N186 End stage renal disease: Secondary | ICD-10-CM | POA: Diagnosis not present

## 2023-05-16 DIAGNOSIS — N186 End stage renal disease: Secondary | ICD-10-CM | POA: Diagnosis not present

## 2023-05-16 DIAGNOSIS — N2581 Secondary hyperparathyroidism of renal origin: Secondary | ICD-10-CM | POA: Diagnosis not present

## 2023-05-16 DIAGNOSIS — Z992 Dependence on renal dialysis: Secondary | ICD-10-CM | POA: Diagnosis not present

## 2023-05-18 DIAGNOSIS — N186 End stage renal disease: Secondary | ICD-10-CM | POA: Diagnosis not present

## 2023-05-18 DIAGNOSIS — I129 Hypertensive chronic kidney disease with stage 1 through stage 4 chronic kidney disease, or unspecified chronic kidney disease: Secondary | ICD-10-CM | POA: Diagnosis not present

## 2023-05-18 DIAGNOSIS — Z992 Dependence on renal dialysis: Secondary | ICD-10-CM | POA: Diagnosis not present

## 2023-05-19 DIAGNOSIS — Z992 Dependence on renal dialysis: Secondary | ICD-10-CM | POA: Diagnosis not present

## 2023-05-19 DIAGNOSIS — N186 End stage renal disease: Secondary | ICD-10-CM | POA: Diagnosis not present

## 2023-05-19 DIAGNOSIS — N2581 Secondary hyperparathyroidism of renal origin: Secondary | ICD-10-CM | POA: Diagnosis not present

## 2023-05-21 DIAGNOSIS — Z992 Dependence on renal dialysis: Secondary | ICD-10-CM | POA: Diagnosis not present

## 2023-05-21 DIAGNOSIS — N186 End stage renal disease: Secondary | ICD-10-CM | POA: Diagnosis not present

## 2023-05-21 DIAGNOSIS — N2581 Secondary hyperparathyroidism of renal origin: Secondary | ICD-10-CM | POA: Diagnosis not present

## 2023-05-22 DIAGNOSIS — Z716 Tobacco abuse counseling: Secondary | ICD-10-CM | POA: Diagnosis not present

## 2023-05-22 DIAGNOSIS — Z992 Dependence on renal dialysis: Secondary | ICD-10-CM | POA: Diagnosis not present

## 2023-05-22 DIAGNOSIS — N186 End stage renal disease: Secondary | ICD-10-CM | POA: Diagnosis not present

## 2023-05-22 DIAGNOSIS — G4733 Obstructive sleep apnea (adult) (pediatric): Secondary | ICD-10-CM | POA: Diagnosis not present

## 2023-05-22 DIAGNOSIS — I7389 Other specified peripheral vascular diseases: Secondary | ICD-10-CM | POA: Diagnosis not present

## 2023-05-22 DIAGNOSIS — Z2821 Immunization not carried out because of patient refusal: Secondary | ICD-10-CM | POA: Diagnosis not present

## 2023-05-22 DIAGNOSIS — I120 Hypertensive chronic kidney disease with stage 5 chronic kidney disease or end stage renal disease: Secondary | ICD-10-CM | POA: Diagnosis not present

## 2023-05-22 DIAGNOSIS — E782 Mixed hyperlipidemia: Secondary | ICD-10-CM | POA: Diagnosis not present

## 2023-05-23 DIAGNOSIS — Z992 Dependence on renal dialysis: Secondary | ICD-10-CM | POA: Diagnosis not present

## 2023-05-23 DIAGNOSIS — N2581 Secondary hyperparathyroidism of renal origin: Secondary | ICD-10-CM | POA: Diagnosis not present

## 2023-05-23 DIAGNOSIS — N186 End stage renal disease: Secondary | ICD-10-CM | POA: Diagnosis not present

## 2023-05-26 DIAGNOSIS — N2581 Secondary hyperparathyroidism of renal origin: Secondary | ICD-10-CM | POA: Diagnosis not present

## 2023-05-26 DIAGNOSIS — Z992 Dependence on renal dialysis: Secondary | ICD-10-CM | POA: Diagnosis not present

## 2023-05-26 DIAGNOSIS — N186 End stage renal disease: Secondary | ICD-10-CM | POA: Diagnosis not present

## 2023-05-28 DIAGNOSIS — N2581 Secondary hyperparathyroidism of renal origin: Secondary | ICD-10-CM | POA: Diagnosis not present

## 2023-05-28 DIAGNOSIS — N186 End stage renal disease: Secondary | ICD-10-CM | POA: Diagnosis not present

## 2023-05-28 DIAGNOSIS — Z992 Dependence on renal dialysis: Secondary | ICD-10-CM | POA: Diagnosis not present

## 2023-05-30 DIAGNOSIS — Z992 Dependence on renal dialysis: Secondary | ICD-10-CM | POA: Diagnosis not present

## 2023-05-30 DIAGNOSIS — N186 End stage renal disease: Secondary | ICD-10-CM | POA: Diagnosis not present

## 2023-05-30 DIAGNOSIS — N2581 Secondary hyperparathyroidism of renal origin: Secondary | ICD-10-CM | POA: Diagnosis not present

## 2023-06-02 DIAGNOSIS — Z992 Dependence on renal dialysis: Secondary | ICD-10-CM | POA: Diagnosis not present

## 2023-06-02 DIAGNOSIS — N186 End stage renal disease: Secondary | ICD-10-CM | POA: Diagnosis not present

## 2023-06-02 DIAGNOSIS — N2581 Secondary hyperparathyroidism of renal origin: Secondary | ICD-10-CM | POA: Diagnosis not present

## 2023-06-04 DIAGNOSIS — N186 End stage renal disease: Secondary | ICD-10-CM | POA: Diagnosis not present

## 2023-06-04 DIAGNOSIS — Z992 Dependence on renal dialysis: Secondary | ICD-10-CM | POA: Diagnosis not present

## 2023-06-04 DIAGNOSIS — N2581 Secondary hyperparathyroidism of renal origin: Secondary | ICD-10-CM | POA: Diagnosis not present

## 2023-06-06 DIAGNOSIS — N2581 Secondary hyperparathyroidism of renal origin: Secondary | ICD-10-CM | POA: Diagnosis not present

## 2023-06-06 DIAGNOSIS — Z992 Dependence on renal dialysis: Secondary | ICD-10-CM | POA: Diagnosis not present

## 2023-06-06 DIAGNOSIS — N186 End stage renal disease: Secondary | ICD-10-CM | POA: Diagnosis not present

## 2023-06-09 DIAGNOSIS — N186 End stage renal disease: Secondary | ICD-10-CM | POA: Diagnosis not present

## 2023-06-09 DIAGNOSIS — N2581 Secondary hyperparathyroidism of renal origin: Secondary | ICD-10-CM | POA: Diagnosis not present

## 2023-06-09 DIAGNOSIS — Z992 Dependence on renal dialysis: Secondary | ICD-10-CM | POA: Diagnosis not present

## 2023-06-11 DIAGNOSIS — N186 End stage renal disease: Secondary | ICD-10-CM | POA: Diagnosis not present

## 2023-06-11 DIAGNOSIS — N2581 Secondary hyperparathyroidism of renal origin: Secondary | ICD-10-CM | POA: Diagnosis not present

## 2023-06-11 DIAGNOSIS — Z992 Dependence on renal dialysis: Secondary | ICD-10-CM | POA: Diagnosis not present

## 2023-06-13 DIAGNOSIS — N2581 Secondary hyperparathyroidism of renal origin: Secondary | ICD-10-CM | POA: Diagnosis not present

## 2023-06-13 DIAGNOSIS — Z992 Dependence on renal dialysis: Secondary | ICD-10-CM | POA: Diagnosis not present

## 2023-06-13 DIAGNOSIS — N186 End stage renal disease: Secondary | ICD-10-CM | POA: Diagnosis not present

## 2023-06-16 DIAGNOSIS — N186 End stage renal disease: Secondary | ICD-10-CM | POA: Diagnosis not present

## 2023-06-16 DIAGNOSIS — Z992 Dependence on renal dialysis: Secondary | ICD-10-CM | POA: Diagnosis not present

## 2023-06-16 DIAGNOSIS — N2581 Secondary hyperparathyroidism of renal origin: Secondary | ICD-10-CM | POA: Diagnosis not present

## 2023-06-18 DIAGNOSIS — N186 End stage renal disease: Secondary | ICD-10-CM | POA: Diagnosis not present

## 2023-06-18 DIAGNOSIS — N2581 Secondary hyperparathyroidism of renal origin: Secondary | ICD-10-CM | POA: Diagnosis not present

## 2023-06-18 DIAGNOSIS — Z992 Dependence on renal dialysis: Secondary | ICD-10-CM | POA: Diagnosis not present

## 2023-06-18 DIAGNOSIS — I129 Hypertensive chronic kidney disease with stage 1 through stage 4 chronic kidney disease, or unspecified chronic kidney disease: Secondary | ICD-10-CM | POA: Diagnosis not present

## 2023-06-20 DIAGNOSIS — N186 End stage renal disease: Secondary | ICD-10-CM | POA: Diagnosis not present

## 2023-06-20 DIAGNOSIS — N2581 Secondary hyperparathyroidism of renal origin: Secondary | ICD-10-CM | POA: Diagnosis not present

## 2023-06-20 DIAGNOSIS — Z992 Dependence on renal dialysis: Secondary | ICD-10-CM | POA: Diagnosis not present

## 2023-06-21 ENCOUNTER — Encounter (HOSPITAL_COMMUNITY): Payer: Self-pay | Admitting: Emergency Medicine

## 2023-06-21 ENCOUNTER — Other Ambulatory Visit: Payer: Self-pay

## 2023-06-21 ENCOUNTER — Ambulatory Visit (HOSPITAL_COMMUNITY)
Admission: EM | Admit: 2023-06-21 | Discharge: 2023-06-21 | Disposition: A | Discharge status: Home / Self Care | Payer: Medicare HMO | Attending: Nurse Practitioner | Admitting: Nurse Practitioner

## 2023-06-21 DIAGNOSIS — H5789 Other specified disorders of eye and adnexa: Secondary | ICD-10-CM

## 2023-06-21 NOTE — ED Provider Notes (Signed)
[facility]    CSN: [id number] Arrival date & time: 06/21/23  1025      History   Chief Complaint Chief Complaint  Patient presents with   Eye Problem    HPI [name] Downs is a 57 y.o. male.   Patient presents today with 2-day history of left eye swelling.  Reports approximately 1 week ago, he was cutting grass and weed eating and he got stung on his left eye, finger, and leg.  He denies any swelling immediately after the bee stings, no shortness of breath, or throat or tongue swelling.  Reports yesterday morning, he woke up with his left eye swollen normal shot.  He also endorses thin, clear drainage coming from the left eye.  No photophobia, headache, vision changes, blurred vision, double vision.  No fevers or nausea/vomiting.  No shortness of breath or throat or tongue swelling.  Reports he has been taking Benadryl for the past couple of days without much improvement.    Past Medical History:  Diagnosis Date   Aortic dissection (HCC)    type B   Brain aneurysm    Chronic kidney disease    Deafness in left ear    Gout    Hypertension    Medically noncompliant    Sleep apnea     does not wears CPAP   Wears glasses     Patient Active Problem List   Diagnosis Date Noted   History of alcohol abuse 08/21/2021   Dependence on renal dialysis (HCC) 06/19/2021   Pruritus, unspecified 04/29/2021   Alcohol abuse 04/10/2021   Clotted dialysis access (HCC) 04/10/2021   S/P diskectomy 04/10/2021   SAH (subarachnoid hemorrhage) (HCC) 04/10/2021   Coagulation defect, unspecified (HCC) 03/12/2021   Allergy, unspecified, initial encounter 03/05/2021   Anaphylactic shock, unspecified, initial encounter 03/05/2021   Anemia in chronic kidney disease 03/05/2021   A-V fistula (HCC) 03/05/2021   Encounter for general adult medical examination without abnormal findings 03/05/2021   Hematuria, unspecified 03/05/2021   Hyperkalemia 03/05/2021   Hyperlipidemia, unspecified  03/05/2021   Iron deficiency anemia, unspecified 03/05/2021   Male erectile dysfunction, unspecified 03/05/2021   Nicotine dependence, unspecified, uncomplicated 03/05/2021   Pain, unspecified 03/05/2021   Proteinuria, unspecified 03/05/2021   Unspecified osteoarthritis, unspecified site 03/05/2021   BMI 32.0-32.9,adult 05/30/2020   OSA (obstructive sleep apnea) 05/30/2020   Brain aneurysm 05/30/2020   COVID-19 05/30/2020   Kidney disease 08/20/2018   Deafness in left ear 10/08/2017   Gout 10/08/2017   Secondary hyperparathyroidism (HCC) 10/08/2017   Tobacco abuse 01/17/2014   CKD (chronic kidney disease) stage 4, GFR 15-29 ml/min (HCC) 12/27/2013   Aortic dissection (HCC)    Hypertension    Medically noncompliant     Past Surgical History:  Procedure Laterality Date   AV FISTULA PLACEMENT Right 12/17/2015   Procedure: CREATION OF RADIOCEPHALIC  ARTERIOVENOUS (AV) FISTULA RIGHT LOWER  ARM;  Surgeon: Sherren Kerns, MD;  Location: [facility] OR;  Service: Vascular;  Laterality: Right;   AV FISTULA PLACEMENT Right 12/17/2015   Procedure: CREATION OF RIGHT UPPER ARM  BRACHIO CEPHALIC ARTERIOVENOUS (AV) FISTULA;  Surgeon: Sherren Kerns, MD;  Location: [facility] OR;  Service: Vascular;  Laterality: Right;   AV FISTULA PLACEMENT Left 09/08/2017   Procedure: ARTERIOVENOUS (AV) FISTULA CREATION LEFT BRACHIOCEPHALIC;  Surgeon: Sherren Kerns, MD;  Location: [facility] OR;  Service: Vascular;  Laterality: Left;   AV FISTULA PLACEMENT Left 11/23/2019   Procedure: LEFT ARM BASILIC VEIN (AV) TRANSPOSITION;  Surgeon: Larina Earthly, MD;  Location: [facility] OR;  Service: Vascular;  Laterality: Left;   BACK SURGERY         Home Medications    Prior to Admission medications   Medication Sig Start Date End Date Taking? Authorizing Provider  allopurinol (ZYLOPRIM) 100 MG tablet Take 100 mg by mouth in the morning, at noon, in the evening, and at bedtime.  09/08/17   Rhyne, Ames Coupe, PA-C  amLODipine (NORVASC) 10 MG  tablet Take 10 mg by mouth at bedtime.     [provider]  calcitRIOL (ROCALTROL) 0.25 MCG capsule Take 0.25 mcg by mouth daily.     [provider]  cinacalcet (SENSIPAR) 30 MG tablet SMARTSIG:1 Tablet(s) By Mouth Every Evening 06/08/21   [provider]  dextromethorphan-guaiFENesin (MUCINEX DM) 30-600 MG 12hr tablet Take 1 tablet by mouth 2 (two) times daily. 05/28/20   Wieters, Hallie C, PA-C  esomeprazole (NEXIUM) 40 MG capsule Take 1 capsule (40 mg total) by mouth daily. 10/04/20   Mardella Layman, MD  fluticasone (FLONASE) 50 MCG/ACT nasal spray Place 2 sprays into both nostrils daily as needed for allergies.  10/23/15   [provider]  furosemide (LASIX) 20 MG tablet Take 20 mg by mouth daily.     [provider]  hydrALAZINE (APRESOLINE) 50 MG tablet Take 50 mg by mouth 2 (two) times daily. 05/06/21   [provider]  lanthanum (FOSRENOL) 1000 MG chewable tablet Chew by mouth 2 (two) times daily. 07/12/21   [provider]  loratadine (CLARITIN) 10 MG tablet Take 10 mg by mouth daily.    [provider]  Miconazole Nitrate (ATHLETES FOOT POWDER SPRAY) 2 % AERP Spray between toes twice daily. 07/22/21   Freddie Breech, DPM  tadalafil (CIALIS) 20 MG tablet SMARTSIG:1 Tablet(s) By Mouth Every 3 Days PRN 07/01/21   [provider]    Family History Family History  Problem Relation Age of Onset   Alzheimer's disease Mother    Cirrhosis Father    Heart attack Brother     Social History Social History   Tobacco Use   Smoking status: Former    Current packs/day: 0.25    Average packs/day: 0.3 packs/day for 20.0 years (5.0 ttl pk-yrs)    Types: Cigarettes   Smokeless tobacco: Never  Vaping Use   Vaping status: Never Used  Substance Use Topics   Alcohol use: No    Alcohol/week: 0.0 standard drinks of alcohol   Drug use: No     Allergies   Patient has no known allergies.   Review of  Systems Review of Systems Per HPI  Physical Exam Triage Vital Signs ED Triage Vitals  Encounter Vitals Group     BP 06/21/23 1110 133/76     Systolic BP Percentile --      Diastolic BP Percentile --      Pulse Rate 06/21/23 1110 61     Resp 06/21/23 1110 20     Temp 06/21/23 1110 98.5 F (36.9 C)     Temp Source 06/21/23 1110 Oral     SpO2 06/21/23 1110 96 %     Weight --      Height --      Head Circumference --      Peak Flow --      Pain Score 06/21/23 1108 1     Pain Loc --      Pain Education --      Exclude  from Growth Chart --    No data found.  Updated Vital Signs BP 133/76 (BP Location: Right Arm) Comment (BP Location): large cuff  Pulse 61   Temp 98.5 F (36.9 C) (Oral)   Resp 20   SpO2 96%   Visual Acuity Right Eye Distance:   Left Eye Distance:   Bilateral Distance:    Right Eye Near:   Left Eye Near:    Bilateral Near:     Physical Exam Vitals and nursing note reviewed.  Constitutional:      General: He is not in acute distress.    Appearance: Normal appearance. He is not toxic-appearing.  HENT:     Head: Normocephalic and atraumatic.     Right Ear: External ear normal.     Left Ear: External ear normal.     Nose: Nose normal. No congestion or rhinorrhea.     Mouth/Throat:     Mouth: Mucous membranes are moist.     Pharynx: Oropharynx is clear. No oropharyngeal exudate or posterior oropharyngeal erythema.  Eyes:     General: Lids are normal. No scleral icterus.       Right eye: No discharge.        Left eye: No discharge.     Extraocular Movements: Extraocular movements intact.     Right eye: Normal extraocular motion.     Left eye: Normal extraocular motion.     Pupils: Pupils are equal, round, and reactive to light.     Comments: No periorbital edema or erythema.  No pain with extraocular movements or pain with palpation around the orbit.  No significant eye drainage or discharge.  Pupils are equal and reactive to light.  Pulmonary:      Effort: Pulmonary effort is normal. No respiratory distress.  Musculoskeletal:     Cervical back: Normal range of motion.  Lymphadenopathy:     Cervical: No cervical adenopathy.  Skin:    General: Skin is warm and dry.     Capillary Refill: Capillary refill takes less than 2 seconds.     Coloration: Skin is not jaundiced or pale.     Findings: No erythema.  Neurological:     Mental Status: He is alert and oriented to person, place, and time.  Psychiatric:        Behavior: Behavior is cooperative.      UC Treatments / Results  Labs (all labs ordered are listed, but only abnormal results are displayed) Labs Reviewed - No data to display  EKG   Radiology No results found.  Procedures Procedures (including critical care time)  Medications Ordered in UC Medications - No data to display  Initial Impression / Assessment and Plan / UC Course  I have reviewed the triage vital signs and the nursing notes.  Pertinent labs & imaging results that were available during my care of the patient were reviewed by me and considered in my medical decision making (see chart for details).   Patient is well-appearing, normotensive, afebrile, not tachycardic, not tachypneic, oxygenating well on room air.    1. Eye swelling, left Unclear etiology No red flags in history or on exam Treat with ongoing oral antihistamine, recommended 5 mg 3 times per week dose based on kidney function since he is on hemodialysis Recommended close follow-up with eye doctor as planned tomorrow Strict ER precautions discussed if symptoms worsen in the meantime  The patient was given the opportunity to ask questions.  All questions answered to their satisfaction.  The patient is in agreement to this plan.    Final Clinical Impressions(s) / UC Diagnoses   Final diagnoses:  Eye swelling, left     Discharge Instructions      For the eye swelling, I recommend taking cetirizine (Zyrtec) 5 mg three times per  week.  Follow up with your Eye Doctor as planned tomorrow.     ED Prescriptions   None    PDMP not reviewed this encounter.   Valentino Nose, NP 06/21/23 1152

## 2023-06-21 NOTE — ED Triage Notes (Signed)
Reports he was stung by bees on left eye, finger, leg.  This occurred one week ago.  Yesterday morning, patient woke with left eye swelling, has been itching and stinging.  Has taken benadryl.  Patient had dialysis yesterday.  Left eye is swollen to where eyelids are almost shut.  Patient has also used "clear eye" eye drops.  Happens to have an eye appointment with eye doctor tomorrow.

## 2023-06-21 NOTE — Discharge Instructions (Addendum)
For the eye swelling, I recommend taking cetirizine (Zyrtec) 5 mg three times per week.  Follow up with your Eye Doctor as planned tomorrow.

## 2023-06-22 DIAGNOSIS — H02844 Edema of left upper eyelid: Secondary | ICD-10-CM | POA: Diagnosis not present

## 2023-06-22 DIAGNOSIS — Z961 Presence of intraocular lens: Secondary | ICD-10-CM | POA: Diagnosis not present

## 2023-06-23 DIAGNOSIS — Z992 Dependence on renal dialysis: Secondary | ICD-10-CM | POA: Diagnosis not present

## 2023-06-23 DIAGNOSIS — N186 End stage renal disease: Secondary | ICD-10-CM | POA: Diagnosis not present

## 2023-06-23 DIAGNOSIS — N2581 Secondary hyperparathyroidism of renal origin: Secondary | ICD-10-CM | POA: Diagnosis not present

## 2023-06-25 DIAGNOSIS — Z992 Dependence on renal dialysis: Secondary | ICD-10-CM | POA: Diagnosis not present

## 2023-06-25 DIAGNOSIS — N186 End stage renal disease: Secondary | ICD-10-CM | POA: Diagnosis not present

## 2023-06-25 DIAGNOSIS — N2581 Secondary hyperparathyroidism of renal origin: Secondary | ICD-10-CM | POA: Diagnosis not present

## 2023-06-27 DIAGNOSIS — N2581 Secondary hyperparathyroidism of renal origin: Secondary | ICD-10-CM | POA: Diagnosis not present

## 2023-06-27 DIAGNOSIS — Z992 Dependence on renal dialysis: Secondary | ICD-10-CM | POA: Diagnosis not present

## 2023-06-27 DIAGNOSIS — N186 End stage renal disease: Secondary | ICD-10-CM | POA: Diagnosis not present

## 2023-06-29 DIAGNOSIS — M25522 Pain in left elbow: Secondary | ICD-10-CM | POA: Diagnosis not present

## 2023-06-30 DIAGNOSIS — N186 End stage renal disease: Secondary | ICD-10-CM | POA: Diagnosis not present

## 2023-06-30 DIAGNOSIS — N2581 Secondary hyperparathyroidism of renal origin: Secondary | ICD-10-CM | POA: Diagnosis not present

## 2023-06-30 DIAGNOSIS — Z992 Dependence on renal dialysis: Secondary | ICD-10-CM | POA: Diagnosis not present

## 2023-07-02 DIAGNOSIS — N186 End stage renal disease: Secondary | ICD-10-CM | POA: Diagnosis not present

## 2023-07-02 DIAGNOSIS — Z992 Dependence on renal dialysis: Secondary | ICD-10-CM | POA: Diagnosis not present

## 2023-07-02 DIAGNOSIS — N2581 Secondary hyperparathyroidism of renal origin: Secondary | ICD-10-CM | POA: Diagnosis not present

## 2023-07-04 DIAGNOSIS — N2581 Secondary hyperparathyroidism of renal origin: Secondary | ICD-10-CM | POA: Diagnosis not present

## 2023-07-04 DIAGNOSIS — Z992 Dependence on renal dialysis: Secondary | ICD-10-CM | POA: Diagnosis not present

## 2023-07-04 DIAGNOSIS — N186 End stage renal disease: Secondary | ICD-10-CM | POA: Diagnosis not present

## 2023-07-07 DIAGNOSIS — N186 End stage renal disease: Secondary | ICD-10-CM | POA: Diagnosis not present

## 2023-07-07 DIAGNOSIS — Z992 Dependence on renal dialysis: Secondary | ICD-10-CM | POA: Diagnosis not present

## 2023-07-07 DIAGNOSIS — N2581 Secondary hyperparathyroidism of renal origin: Secondary | ICD-10-CM | POA: Diagnosis not present

## 2023-07-09 DIAGNOSIS — Z992 Dependence on renal dialysis: Secondary | ICD-10-CM | POA: Diagnosis not present

## 2023-07-09 DIAGNOSIS — N2581 Secondary hyperparathyroidism of renal origin: Secondary | ICD-10-CM | POA: Diagnosis not present

## 2023-07-09 DIAGNOSIS — N186 End stage renal disease: Secondary | ICD-10-CM | POA: Diagnosis not present

## 2023-07-11 DIAGNOSIS — N2581 Secondary hyperparathyroidism of renal origin: Secondary | ICD-10-CM | POA: Diagnosis not present

## 2023-07-11 DIAGNOSIS — N186 End stage renal disease: Secondary | ICD-10-CM | POA: Diagnosis not present

## 2023-07-11 DIAGNOSIS — Z992 Dependence on renal dialysis: Secondary | ICD-10-CM | POA: Diagnosis not present

## 2023-07-13 DIAGNOSIS — Z992 Dependence on renal dialysis: Secondary | ICD-10-CM | POA: Diagnosis not present

## 2023-07-13 DIAGNOSIS — N2581 Secondary hyperparathyroidism of renal origin: Secondary | ICD-10-CM | POA: Diagnosis not present

## 2023-07-13 DIAGNOSIS — N186 End stage renal disease: Secondary | ICD-10-CM | POA: Diagnosis not present

## 2023-07-15 DIAGNOSIS — N186 End stage renal disease: Secondary | ICD-10-CM | POA: Diagnosis not present

## 2023-07-15 DIAGNOSIS — N2581 Secondary hyperparathyroidism of renal origin: Secondary | ICD-10-CM | POA: Diagnosis not present

## 2023-07-15 DIAGNOSIS — Z992 Dependence on renal dialysis: Secondary | ICD-10-CM | POA: Diagnosis not present

## 2023-07-18 DIAGNOSIS — Z992 Dependence on renal dialysis: Secondary | ICD-10-CM | POA: Diagnosis not present

## 2023-07-18 DIAGNOSIS — I129 Hypertensive chronic kidney disease with stage 1 through stage 4 chronic kidney disease, or unspecified chronic kidney disease: Secondary | ICD-10-CM | POA: Diagnosis not present

## 2023-07-18 DIAGNOSIS — N2581 Secondary hyperparathyroidism of renal origin: Secondary | ICD-10-CM | POA: Diagnosis not present

## 2023-07-18 DIAGNOSIS — N186 End stage renal disease: Secondary | ICD-10-CM | POA: Diagnosis not present

## 2023-07-21 DIAGNOSIS — N2581 Secondary hyperparathyroidism of renal origin: Secondary | ICD-10-CM | POA: Diagnosis not present

## 2023-07-21 DIAGNOSIS — Z992 Dependence on renal dialysis: Secondary | ICD-10-CM | POA: Diagnosis not present

## 2023-07-21 DIAGNOSIS — N186 End stage renal disease: Secondary | ICD-10-CM | POA: Diagnosis not present

## 2023-07-23 DIAGNOSIS — N2581 Secondary hyperparathyroidism of renal origin: Secondary | ICD-10-CM | POA: Diagnosis not present

## 2023-07-23 DIAGNOSIS — N186 End stage renal disease: Secondary | ICD-10-CM | POA: Diagnosis not present

## 2023-07-23 DIAGNOSIS — Z992 Dependence on renal dialysis: Secondary | ICD-10-CM | POA: Diagnosis not present

## 2023-07-25 DIAGNOSIS — N186 End stage renal disease: Secondary | ICD-10-CM | POA: Diagnosis not present

## 2023-07-25 DIAGNOSIS — N2581 Secondary hyperparathyroidism of renal origin: Secondary | ICD-10-CM | POA: Diagnosis not present

## 2023-07-25 DIAGNOSIS — Z992 Dependence on renal dialysis: Secondary | ICD-10-CM | POA: Diagnosis not present

## 2023-07-28 DIAGNOSIS — N186 End stage renal disease: Secondary | ICD-10-CM | POA: Diagnosis not present

## 2023-07-28 DIAGNOSIS — N2581 Secondary hyperparathyroidism of renal origin: Secondary | ICD-10-CM | POA: Diagnosis not present

## 2023-07-28 DIAGNOSIS — Z992 Dependence on renal dialysis: Secondary | ICD-10-CM | POA: Diagnosis not present

## 2023-07-29 DIAGNOSIS — H02844 Edema of left upper eyelid: Secondary | ICD-10-CM | POA: Diagnosis not present

## 2023-07-29 DIAGNOSIS — H40023 Open angle with borderline findings, high risk, bilateral: Secondary | ICD-10-CM | POA: Diagnosis not present

## 2023-07-29 DIAGNOSIS — Z961 Presence of intraocular lens: Secondary | ICD-10-CM | POA: Diagnosis not present

## 2023-07-30 DIAGNOSIS — N2581 Secondary hyperparathyroidism of renal origin: Secondary | ICD-10-CM | POA: Diagnosis not present

## 2023-07-30 DIAGNOSIS — N186 End stage renal disease: Secondary | ICD-10-CM | POA: Diagnosis not present

## 2023-07-30 DIAGNOSIS — Z992 Dependence on renal dialysis: Secondary | ICD-10-CM | POA: Diagnosis not present

## 2023-08-01 DIAGNOSIS — N2581 Secondary hyperparathyroidism of renal origin: Secondary | ICD-10-CM | POA: Diagnosis not present

## 2023-08-01 DIAGNOSIS — Z992 Dependence on renal dialysis: Secondary | ICD-10-CM | POA: Diagnosis not present

## 2023-08-01 DIAGNOSIS — N186 End stage renal disease: Secondary | ICD-10-CM | POA: Diagnosis not present

## 2023-08-04 DIAGNOSIS — Z992 Dependence on renal dialysis: Secondary | ICD-10-CM | POA: Diagnosis not present

## 2023-08-04 DIAGNOSIS — N186 End stage renal disease: Secondary | ICD-10-CM | POA: Diagnosis not present

## 2023-08-04 DIAGNOSIS — N2581 Secondary hyperparathyroidism of renal origin: Secondary | ICD-10-CM | POA: Diagnosis not present

## 2023-08-06 DIAGNOSIS — Z992 Dependence on renal dialysis: Secondary | ICD-10-CM | POA: Diagnosis not present

## 2023-08-06 DIAGNOSIS — N2581 Secondary hyperparathyroidism of renal origin: Secondary | ICD-10-CM | POA: Diagnosis not present

## 2023-08-06 DIAGNOSIS — N186 End stage renal disease: Secondary | ICD-10-CM | POA: Diagnosis not present

## 2023-08-08 DIAGNOSIS — N186 End stage renal disease: Secondary | ICD-10-CM | POA: Diagnosis not present

## 2023-08-08 DIAGNOSIS — N2581 Secondary hyperparathyroidism of renal origin: Secondary | ICD-10-CM | POA: Diagnosis not present

## 2023-08-08 DIAGNOSIS — Z992 Dependence on renal dialysis: Secondary | ICD-10-CM | POA: Diagnosis not present

## 2023-08-10 DIAGNOSIS — N2581 Secondary hyperparathyroidism of renal origin: Secondary | ICD-10-CM | POA: Diagnosis not present

## 2023-08-10 DIAGNOSIS — N186 End stage renal disease: Secondary | ICD-10-CM | POA: Diagnosis not present

## 2023-08-10 DIAGNOSIS — Z992 Dependence on renal dialysis: Secondary | ICD-10-CM | POA: Diagnosis not present

## 2023-08-13 DIAGNOSIS — Z992 Dependence on renal dialysis: Secondary | ICD-10-CM | POA: Diagnosis not present

## 2023-08-13 DIAGNOSIS — N186 End stage renal disease: Secondary | ICD-10-CM | POA: Diagnosis not present

## 2023-08-13 DIAGNOSIS — N2581 Secondary hyperparathyroidism of renal origin: Secondary | ICD-10-CM | POA: Diagnosis not present

## 2023-08-15 DIAGNOSIS — Z992 Dependence on renal dialysis: Secondary | ICD-10-CM | POA: Diagnosis not present

## 2023-08-15 DIAGNOSIS — N2581 Secondary hyperparathyroidism of renal origin: Secondary | ICD-10-CM | POA: Diagnosis not present

## 2023-08-15 DIAGNOSIS — N186 End stage renal disease: Secondary | ICD-10-CM | POA: Diagnosis not present

## 2023-08-17 DIAGNOSIS — N186 End stage renal disease: Secondary | ICD-10-CM | POA: Diagnosis not present

## 2023-08-17 DIAGNOSIS — N2581 Secondary hyperparathyroidism of renal origin: Secondary | ICD-10-CM | POA: Diagnosis not present

## 2023-08-17 DIAGNOSIS — Z992 Dependence on renal dialysis: Secondary | ICD-10-CM | POA: Diagnosis not present

## 2023-08-18 DIAGNOSIS — Z992 Dependence on renal dialysis: Secondary | ICD-10-CM | POA: Diagnosis not present

## 2023-08-18 DIAGNOSIS — N186 End stage renal disease: Secondary | ICD-10-CM | POA: Diagnosis not present

## 2023-08-18 DIAGNOSIS — I129 Hypertensive chronic kidney disease with stage 1 through stage 4 chronic kidney disease, or unspecified chronic kidney disease: Secondary | ICD-10-CM | POA: Diagnosis not present

## 2023-08-20 DIAGNOSIS — N2581 Secondary hyperparathyroidism of renal origin: Secondary | ICD-10-CM | POA: Diagnosis not present

## 2023-08-20 DIAGNOSIS — Z992 Dependence on renal dialysis: Secondary | ICD-10-CM | POA: Diagnosis not present

## 2023-08-20 DIAGNOSIS — N186 End stage renal disease: Secondary | ICD-10-CM | POA: Diagnosis not present

## 2023-08-21 DIAGNOSIS — N186 End stage renal disease: Secondary | ICD-10-CM | POA: Diagnosis not present

## 2023-08-21 DIAGNOSIS — Z01818 Encounter for other preprocedural examination: Secondary | ICD-10-CM | POA: Diagnosis not present

## 2023-08-21 DIAGNOSIS — Z940 Kidney transplant status: Secondary | ICD-10-CM | POA: Diagnosis not present

## 2023-08-22 DIAGNOSIS — Z992 Dependence on renal dialysis: Secondary | ICD-10-CM | POA: Diagnosis not present

## 2023-08-22 DIAGNOSIS — N2581 Secondary hyperparathyroidism of renal origin: Secondary | ICD-10-CM | POA: Diagnosis not present

## 2023-08-22 DIAGNOSIS — N186 End stage renal disease: Secondary | ICD-10-CM | POA: Diagnosis not present

## 2023-08-25 DIAGNOSIS — Z992 Dependence on renal dialysis: Secondary | ICD-10-CM | POA: Diagnosis not present

## 2023-08-25 DIAGNOSIS — N186 End stage renal disease: Secondary | ICD-10-CM | POA: Diagnosis not present

## 2023-08-25 DIAGNOSIS — N2581 Secondary hyperparathyroidism of renal origin: Secondary | ICD-10-CM | POA: Diagnosis not present

## 2023-08-27 DIAGNOSIS — Z992 Dependence on renal dialysis: Secondary | ICD-10-CM | POA: Diagnosis not present

## 2023-08-27 DIAGNOSIS — N186 End stage renal disease: Secondary | ICD-10-CM | POA: Diagnosis not present

## 2023-08-27 DIAGNOSIS — N2581 Secondary hyperparathyroidism of renal origin: Secondary | ICD-10-CM | POA: Diagnosis not present

## 2023-08-29 DIAGNOSIS — N186 End stage renal disease: Secondary | ICD-10-CM | POA: Diagnosis not present

## 2023-08-29 DIAGNOSIS — N2581 Secondary hyperparathyroidism of renal origin: Secondary | ICD-10-CM | POA: Diagnosis not present

## 2023-08-29 DIAGNOSIS — Z992 Dependence on renal dialysis: Secondary | ICD-10-CM | POA: Diagnosis not present

## 2023-09-01 DIAGNOSIS — N186 End stage renal disease: Secondary | ICD-10-CM | POA: Diagnosis not present

## 2023-09-01 DIAGNOSIS — Z992 Dependence on renal dialysis: Secondary | ICD-10-CM | POA: Diagnosis not present

## 2023-09-01 DIAGNOSIS — N2581 Secondary hyperparathyroidism of renal origin: Secondary | ICD-10-CM | POA: Diagnosis not present

## 2023-09-02 DIAGNOSIS — I129 Hypertensive chronic kidney disease with stage 1 through stage 4 chronic kidney disease, or unspecified chronic kidney disease: Secondary | ICD-10-CM | POA: Diagnosis not present

## 2023-09-02 DIAGNOSIS — J329 Chronic sinusitis, unspecified: Secondary | ICD-10-CM | POA: Diagnosis not present

## 2023-09-02 DIAGNOSIS — N185 Chronic kidney disease, stage 5: Secondary | ICD-10-CM | POA: Diagnosis not present

## 2023-09-03 DIAGNOSIS — N2581 Secondary hyperparathyroidism of renal origin: Secondary | ICD-10-CM | POA: Diagnosis not present

## 2023-09-03 DIAGNOSIS — N186 End stage renal disease: Secondary | ICD-10-CM | POA: Diagnosis not present

## 2023-09-03 DIAGNOSIS — Z992 Dependence on renal dialysis: Secondary | ICD-10-CM | POA: Diagnosis not present

## 2023-09-05 DIAGNOSIS — N2581 Secondary hyperparathyroidism of renal origin: Secondary | ICD-10-CM | POA: Diagnosis not present

## 2023-09-05 DIAGNOSIS — N186 End stage renal disease: Secondary | ICD-10-CM | POA: Diagnosis not present

## 2023-09-05 DIAGNOSIS — Z992 Dependence on renal dialysis: Secondary | ICD-10-CM | POA: Diagnosis not present

## 2023-09-08 DIAGNOSIS — N186 End stage renal disease: Secondary | ICD-10-CM | POA: Diagnosis not present

## 2023-09-08 DIAGNOSIS — Z992 Dependence on renal dialysis: Secondary | ICD-10-CM | POA: Diagnosis not present

## 2023-09-08 DIAGNOSIS — N2581 Secondary hyperparathyroidism of renal origin: Secondary | ICD-10-CM | POA: Diagnosis not present

## 2023-09-10 DIAGNOSIS — N2581 Secondary hyperparathyroidism of renal origin: Secondary | ICD-10-CM | POA: Diagnosis not present

## 2023-09-10 DIAGNOSIS — Z992 Dependence on renal dialysis: Secondary | ICD-10-CM | POA: Diagnosis not present

## 2023-09-10 DIAGNOSIS — N186 End stage renal disease: Secondary | ICD-10-CM | POA: Diagnosis not present

## 2023-09-12 DIAGNOSIS — Z992 Dependence on renal dialysis: Secondary | ICD-10-CM | POA: Diagnosis not present

## 2023-09-12 DIAGNOSIS — N186 End stage renal disease: Secondary | ICD-10-CM | POA: Diagnosis not present

## 2023-09-12 DIAGNOSIS — N2581 Secondary hyperparathyroidism of renal origin: Secondary | ICD-10-CM | POA: Diagnosis not present

## 2023-09-14 ENCOUNTER — Ambulatory Visit: Payer: Self-pay

## 2023-09-15 DIAGNOSIS — Z992 Dependence on renal dialysis: Secondary | ICD-10-CM | POA: Diagnosis not present

## 2023-09-15 DIAGNOSIS — N186 End stage renal disease: Secondary | ICD-10-CM | POA: Diagnosis not present

## 2023-09-15 DIAGNOSIS — N2581 Secondary hyperparathyroidism of renal origin: Secondary | ICD-10-CM | POA: Diagnosis not present

## 2023-09-17 DIAGNOSIS — Z992 Dependence on renal dialysis: Secondary | ICD-10-CM | POA: Diagnosis not present

## 2023-09-17 DIAGNOSIS — N2581 Secondary hyperparathyroidism of renal origin: Secondary | ICD-10-CM | POA: Diagnosis not present

## 2023-09-17 DIAGNOSIS — N186 End stage renal disease: Secondary | ICD-10-CM | POA: Diagnosis not present

## 2023-09-18 DIAGNOSIS — Z992 Dependence on renal dialysis: Secondary | ICD-10-CM | POA: Diagnosis not present

## 2023-09-18 DIAGNOSIS — N186 End stage renal disease: Secondary | ICD-10-CM | POA: Diagnosis not present

## 2023-09-18 DIAGNOSIS — I129 Hypertensive chronic kidney disease with stage 1 through stage 4 chronic kidney disease, or unspecified chronic kidney disease: Secondary | ICD-10-CM | POA: Diagnosis not present

## 2023-09-19 DIAGNOSIS — N186 End stage renal disease: Secondary | ICD-10-CM | POA: Diagnosis not present

## 2023-09-19 DIAGNOSIS — Z992 Dependence on renal dialysis: Secondary | ICD-10-CM | POA: Diagnosis not present

## 2023-09-19 DIAGNOSIS — N2581 Secondary hyperparathyroidism of renal origin: Secondary | ICD-10-CM | POA: Diagnosis not present

## 2023-09-22 DIAGNOSIS — Z992 Dependence on renal dialysis: Secondary | ICD-10-CM | POA: Diagnosis not present

## 2023-09-22 DIAGNOSIS — N186 End stage renal disease: Secondary | ICD-10-CM | POA: Diagnosis not present

## 2023-09-22 DIAGNOSIS — N2581 Secondary hyperparathyroidism of renal origin: Secondary | ICD-10-CM | POA: Diagnosis not present

## 2023-09-23 DIAGNOSIS — I120 Hypertensive chronic kidney disease with stage 5 chronic kidney disease or end stage renal disease: Secondary | ICD-10-CM | POA: Diagnosis not present

## 2023-09-23 DIAGNOSIS — J329 Chronic sinusitis, unspecified: Secondary | ICD-10-CM | POA: Diagnosis not present

## 2023-09-23 DIAGNOSIS — N185 Chronic kidney disease, stage 5: Secondary | ICD-10-CM | POA: Diagnosis not present

## 2023-09-23 DIAGNOSIS — Z0100 Encounter for examination of eyes and vision without abnormal findings: Secondary | ICD-10-CM | POA: Diagnosis not present

## 2023-09-23 DIAGNOSIS — H524 Presbyopia: Secondary | ICD-10-CM | POA: Diagnosis not present

## 2023-09-24 DIAGNOSIS — N186 End stage renal disease: Secondary | ICD-10-CM | POA: Diagnosis not present

## 2023-09-24 DIAGNOSIS — Z992 Dependence on renal dialysis: Secondary | ICD-10-CM | POA: Diagnosis not present

## 2023-09-24 DIAGNOSIS — N2581 Secondary hyperparathyroidism of renal origin: Secondary | ICD-10-CM | POA: Diagnosis not present

## 2023-09-25 ENCOUNTER — Ambulatory Visit (HOSPITAL_COMMUNITY)
Admission: RE | Admit: 2023-09-25 | Discharge: 2023-09-25 | Disposition: A | Discharge status: Home / Self Care | Payer: Medicare HMO | Attending: Nephrology | Admitting: Nephrology

## 2023-09-25 ENCOUNTER — Encounter (HOSPITAL_COMMUNITY)
Admission: RE | Disposition: A | Discharge status: Home / Self Care | Payer: Self-pay | Source: Home / Self Care | Attending: Nephrology

## 2023-09-25 ENCOUNTER — Other Ambulatory Visit: Payer: Self-pay

## 2023-09-25 ENCOUNTER — Encounter (HOSPITAL_COMMUNITY): Payer: Self-pay | Admitting: Nephrology

## 2023-09-25 DIAGNOSIS — Y832 Surgical operation with anastomosis, bypass or graft as the cause of abnormal reaction of the patient, or of later complication, without mention of misadventure at the time of the procedure: Secondary | ICD-10-CM | POA: Diagnosis not present

## 2023-09-25 DIAGNOSIS — N186 End stage renal disease: Secondary | ICD-10-CM | POA: Diagnosis not present

## 2023-09-25 DIAGNOSIS — M109 Gout, unspecified: Secondary | ICD-10-CM | POA: Diagnosis not present

## 2023-09-25 DIAGNOSIS — Z992 Dependence on renal dialysis: Secondary | ICD-10-CM | POA: Insufficient documentation

## 2023-09-25 DIAGNOSIS — D631 Anemia in chronic kidney disease: Secondary | ICD-10-CM | POA: Insufficient documentation

## 2023-09-25 DIAGNOSIS — G4733 Obstructive sleep apnea (adult) (pediatric): Secondary | ICD-10-CM | POA: Insufficient documentation

## 2023-09-25 DIAGNOSIS — Z87891 Personal history of nicotine dependence: Secondary | ICD-10-CM | POA: Diagnosis not present

## 2023-09-25 DIAGNOSIS — N250 Renal osteodystrophy: Secondary | ICD-10-CM | POA: Insufficient documentation

## 2023-09-25 DIAGNOSIS — I120 Hypertensive chronic kidney disease with stage 5 chronic kidney disease or end stage renal disease: Secondary | ICD-10-CM | POA: Diagnosis not present

## 2023-09-25 DIAGNOSIS — T82858A Stenosis of vascular prosthetic devices, implants and grafts, initial encounter: Secondary | ICD-10-CM | POA: Insufficient documentation

## 2023-09-25 DIAGNOSIS — I2510 Atherosclerotic heart disease of native coronary artery without angina pectoris: Secondary | ICD-10-CM | POA: Insufficient documentation

## 2023-09-25 DIAGNOSIS — I871 Compression of vein: Secondary | ICD-10-CM | POA: Diagnosis not present

## 2023-09-25 HISTORY — PX: A/V FISTULAGRAM: CATH118298

## 2023-09-25 HISTORY — PX: PERIPHERAL VASCULAR BALLOON ANGIOPLASTY: CATH118281

## 2023-09-25 SURGERY — A/V FISTULAGRAM
Anesthesia: LOCAL

## 2023-09-25 MED ORDER — FENTANYL CITRATE (PF) 100 MCG/2ML IJ SOLN
INTRAMUSCULAR | Status: AC
  Filled 2023-09-25: qty 2

## 2023-09-25 MED ORDER — IODIXANOL 320 MG/ML IV SOLN
INTRAVENOUS | Status: DC | PRN
  Administered 2023-09-25: 8 mL via INTRAVENOUS

## 2023-09-25 MED ORDER — LIDOCAINE HCL (PF) 1 % IJ SOLN
INTRAMUSCULAR | Status: DC | PRN
  Administered 2023-09-25: 2 mL via SUBCUTANEOUS

## 2023-09-25 MED ORDER — HEPARIN (PORCINE) IN NACL 1000-0.9 UT/500ML-% IV SOLN
INTRAVENOUS | Status: DC | PRN
  Administered 2023-09-25: 500 mL

## 2023-09-25 MED ORDER — MIDAZOLAM HCL 2 MG/2ML IJ SOLN
INTRAMUSCULAR | Status: DC | PRN
  Administered 2023-09-25: 1 mg via INTRAVENOUS

## 2023-09-25 MED ORDER — MIDAZOLAM HCL 2 MG/2ML IJ SOLN
INTRAMUSCULAR | Status: AC
  Filled 2023-09-25: qty 2

## 2023-09-25 MED ORDER — LIDOCAINE HCL (PF) 1 % IJ SOLN
INTRAMUSCULAR | Status: AC
  Filled 2023-09-25: qty 30

## 2023-09-25 MED ORDER — FENTANYL CITRATE (PF) 100 MCG/2ML IJ SOLN
INTRAMUSCULAR | Status: DC | PRN
  Administered 2023-09-25: 25 ug via INTRAVENOUS

## 2023-09-25 SURGICAL SUPPLY — 9 items
BAG SNAP BAND KOVER 36X36 (MISCELLANEOUS) ×2 IMPLANT
BALLN MUSTANG 8.0X40 75 (BALLOONS) ×2
BALLOON MUSTANG 8.0X40 75 (BALLOONS) IMPLANT
CATH ANGIO 5F BER2 65CM (CATHETERS) IMPLANT
COVER DOME SNAP 22 D (MISCELLANEOUS) ×2 IMPLANT
GUIDEWIRE ANGLED .035X150CM (WIRE) IMPLANT
SHEATH PINNACLE R/O II 6F 4CM (SHEATH) IMPLANT
SYR MEDALLION 10ML (SYRINGE) IMPLANT
TRAY PV CATH (CUSTOM PROCEDURE TRAY) ×2 IMPLANT

## 2023-09-25 NOTE — H&P (Addendum)
 Chief Complaint: Decreased flows  Interval H&P  The patient has presented today for an angiogram/ angioplasty.  Various methods of treatment have been discussed with the patient.  After consideration of risk, benefits and other options for treatment, the patient has consented to a angiogram/ angioplasty with  possible stent placement.   Risks of angiogram with potential angioplasty and stenting if needed.contrast reaction, extravasation/ bleeding, dissection, hypotension and death were explained to the patient.  The patient's history has been reviewed and the patient has been examined, no changes in status.  Stable for angiogram/angioplasty  I have reviewed the patient's chart and labs.  Questions were answered to the patient's satisfaction.  Assessment/Plan: ESRD dialyzing TTS regimen Decreased access flows a left transposed brachiobasilic fistula placed by Dr. Oris- planning on angiogram with possibly angioplasty. Renal osteodystrophy - continue binders per home regimen. Anemia - managed with ESA's and IV iron at dialysis center. HTN - resume home regimen.   HPI: [name] Downs is an 58 y.o. male history of a subarachnoid hemorrhage, dissection of the thoracic aorta, hypertension, gout, discectomy, brain aneurysm, obstructive sleep apnea, coronary atherosclerotic heart disease, ESRD.  She has numbness in both hands and has already been evaluated for steal by Dr. Cheryle Rim previously can occlusion that this is unlikely to be steal with bilateral presentation.  ROS Per HPI.  Chemistry and CBC: Creatinine, Ser  Date/Time Value Ref Range Status  11/23/2019 08:08 AM 10.50 (H) 0.61 - 1.24 mg/dL Final  98/77/7980 91:88 AM 5.78 (H) 0.61 - 1.24 mg/dL Final  94/97/7982 88:75 AM 4.35 (H) 0.61 - 1.24 mg/dL Final  91/71/7984 90:98 AM 3.9 (H) 0.4 - 1.5 mg/dL Final  94/87/7984 94:70 PM 4.0 (H) 0.4 - 1.5 mg/dL Final  87/87/7987 89:54 PM 1.90 (H) 0.50 - 1.35 mg/dL Final  89/97/7988 94:45 PM 1.86  (H) 0.4 - 1.5 mg/dL Final  94/84/7990 93:52 AM 1.66 (H)  Final  03/05/2007 07:48 AM 1.49  Final   No results for input(s): NA, K, CL, CO2, GLUCOSE, BUN, CREATININE, CALCIUM, PHOS in the last 168 hours.  Invalid input(s): ALB No results for input(s): WBC, NEUTROABS, HGB, HCT, MCV, PLT in the last 168 hours. Liver Function Tests: No results for input(s): AST, ALT, ALKPHOS, BILITOT, PROT, ALBUMIN in the last 168 hours. No results for input(s): LIPASE, AMYLASE in the last 168 hours. No results for input(s): AMMONIA in the last 168 hours. Cardiac Enzymes: No results for input(s): CKTOTAL, CKMB, CKMBINDEX, TROPONINI in the last 168 hours. Iron Studies: No results for input(s): IRON, TIBC, TRANSFERRIN, FERRITIN in the last 72 hours. PT/INR: @LABRCNTIP (inr:5)  Xrays/Other Studies: )No results found for this or any previous visit (from the past 48 hours). No results found.  PMH:   Past Medical History:  Diagnosis Date   Aortic dissection (HCC)    type B   Brain aneurysm    Chronic kidney disease    Deafness in left ear    Gout    Hypertension    Medically noncompliant    Sleep apnea     does not wears CPAP   Wears glasses     PSH:   Past Surgical History:  Procedure Laterality Date   AV FISTULA PLACEMENT Right 12/17/2015   Procedure: CREATION OF RADIOCEPHALIC  ARTERIOVENOUS (AV) FISTULA RIGHT LOWER  ARM;  Surgeon: Carlin FORBES Haddock, MD;  Location: [facility] OR;  Service: Vascular;  Laterality: Right;   AV FISTULA PLACEMENT Right 12/17/2015   Procedure: CREATION OF RIGHT UPPER ARM  BRACHIO  CEPHALIC ARTERIOVENOUS (AV) FISTULA;  Surgeon: Carlin FORBES Haddock, MD;  Location: [facility] OR;  Service: Vascular;  Laterality: Right;   AV FISTULA PLACEMENT Left 09/08/2017   Procedure: ARTERIOVENOUS (AV) FISTULA CREATION LEFT BRACHIOCEPHALIC;  Surgeon: Haddock Carlin FORBES, MD;  Location: [facility] OR;  Service: Vascular;  Laterality: Left;   AV FISTULA  PLACEMENT Left 11/23/2019   Procedure: LEFT ARM BASILIC VEIN (AV) TRANSPOSITION;  Surgeon: Oris Krystal FALCON, MD;  Location: [facility] OR;  Service: Vascular;  Laterality: Left;   BACK SURGERY      Allergies: No Known Allergies  Medications:   Prior to Admission medications   Medication Sig Start Date End Date Taking? Authorizing Provider  allopurinol  (ZYLOPRIM ) 100 MG tablet Take 400 mg by mouth daily. 09/08/17  Yes Rhyne, Lucie PARAS, PA-C  aspirin EC 81 MG tablet Take 81 mg by mouth daily. Swallow whole.   Yes [provider]  atorvastatin (LIPITOR) 40 MG tablet Take 40 mg by mouth daily.   Yes [provider]  cetirizine (ZYRTEC) 10 MG tablet Take 10 mg by mouth daily.   Yes [provider]  fluticasone (FLONASE) 50 MCG/ACT nasal spray Place 2 sprays into both nostrils daily as needed for allergies.  10/23/15  Yes [provider]  multivitamin (RENA-VIT) TABS tablet Take 1 tablet by mouth daily.   Yes [provider]  pantoprazole (PROTONIX) 40 MG tablet Take 40 mg by mouth daily.   Yes [provider]  sevelamer (RENAGEL) 800 MG tablet Take 1,600 mg by mouth 3 (three) times daily with meals.   Yes [provider]    Discontinued Meds:   Medications Discontinued During This Encounter  Medication Reason   amLODipine (NORVASC) 10 MG tablet Patient Preference   cinacalcet  (SENSIPAR ) 30 MG tablet Patient Preference   calcitRIOL (ROCALTROL) 0.25 MCG capsule Patient Preference   dextromethorphan-guaiFENesin  (MUCINEX  DM) 30-600 MG 12hr tablet Patient Preference   esomeprazole  (NEXIUM ) 40 MG capsule Patient Preference   furosemide (LASIX) 20 MG tablet Patient Preference   hydrALAZINE (APRESOLINE) 50 MG tablet Patient Preference   loratadine (CLARITIN) 10 MG tablet Patient Preference   Miconazole Nitrate (ATHLETES FOOT POWDER SPRAY) 2 % AERP Patient Preference   tadalafil (CIALIS) 20 MG tablet Patient Preference   lanthanum (FOSRENOL) 1000 MG  chewable tablet Patient Preference    Social History:  reports that he has quit smoking. His smoking use included cigarettes. He has a 5 pack-year smoking history. He has never used smokeless tobacco. He reports that he does not drink alcohol and does not use drugs.  Family History:   Family History  Problem Relation Age of Onset   Alzheimer's disease Mother    Cirrhosis Father    Heart attack Brother     Blood pressure 100/67, pulse 82, resp. rate 16, height 6' 0.05 (1.83 m), weight 112.3 kg, SpO2 95%. GEN: NAD, A&Ox3, NCAT HEENT: No conjunctival pallor, EOMI NECK: Supple, no thyromegaly LUNGS: CTA B/L no rales, rhonchi or wheezing CV: RRR, No M/R/G ABD: SNDNT +BS  EXT: No lower extremity edema ACCESS: lt BBT pulsatile at the inflow        MELIA LYNWOOD ORN, MD 09/25/2023, 10:20 AM

## 2023-09-25 NOTE — Discharge Instructions (Signed)

## 2023-09-25 NOTE — Op Note (Signed)
 Patient presents for concerns of difficult cannulation in his left BBT which was transposed on the seventh 2021 by Dr. Oris.  February 25, 2023.    On the physical exam, the fistula is hyperpulsatile at the inflow. Patient still has a right internal jugular tunneled catheter present.  Summary:  1)      The patient had successful angioplasty (8x4 Mustang FE ~18 atm) of significant 60%  stenosis in the inflow basilic vein above the juxta anastomotic site; very large caliber aneurysmal fistula and multiple intervals.   2)      the body of the fistula again is aneurysmal, patent outflow swing, axillary, centrals and inflow anastomosis.  Distal vessels are very small.   3)      This left BBT remains amenable to future percutaneous intervention as long as it remains patent at least 3 months.  Description of procedure: The arm was prepped and draped in the usual sterile fashion. The left upper arm brachial basilic fistula was cannulated (63097) with an 18G Angiocath needle directed in a retrograde direction in venous limb of the fistula. A guidewire was inserted and exchanged for a 6 Fr sheath. Contrast [phone]) injection via the side port of the sheath was performed. The angiogram of the fistula (63097) showed an aneurysmal body of the basilic vein in multiple intervals, patent outflow basilic swing site, axillary vein, centrals.  The angled Glidewire was advanced and manipulated until the tip of the wire was in the proximal brachial artery.  Arteriogram revealed a patent brachial artery, arterial anastomosis but there was a 50-70% focal inflow basilic vein stenosis approximately 2 to 3 inches above the anastomosis.    A 8x4 Mustang angioplasty balloon was then inserted over the guidewire and positioned at the basilic vein inflow stenosis.   Venous angioplasty (63097) was carried out to 22 ATM with FULL effacement of the waist on the balloon at the basilic inflow site lesion. The repeat angiogram showed 10%  residual stenosis with no evidence of extravasation or dissection.  Hemostasis: A 3-0 ethilon purse string suture was placed at the cannulation site on removal of the sheath.  Sedation: 1 mg Versed , 25 mcg Fentanyl . Sedation time. 17 minutes  Contrast. 8 mL  Monitoring: Because of the patient's comorbid conditions and sedation during the procedure, continuous EKG monitoring and O2 saturation monitoring was performed throughout the procedure by the RN. There were no abnormal arrhythmias encountered.  Complications: None  Diagnoses: I87.1 Stricture of vein  N18.6 ESRD T82.858A Stricture of access  Procedure Coding:  [phone] Cannulation and angiogram of fistula, venous angioplasty (basilic vein outflow swing site)  V0032 Contrast  Recommendations:  1. Continue to cannulate the fistula with 15G needles.  2. Refer for problems with flows/swelling. 3. Remove the suture next treatment.   Discharge: The patient was discharged home in stable condition. The patient was given education regarding the care of the dialysis access AVF and specific instructions in case of any problems.

## 2023-09-26 DIAGNOSIS — Z992 Dependence on renal dialysis: Secondary | ICD-10-CM | POA: Diagnosis not present

## 2023-09-26 DIAGNOSIS — N2581 Secondary hyperparathyroidism of renal origin: Secondary | ICD-10-CM | POA: Diagnosis not present

## 2023-09-26 DIAGNOSIS — N186 End stage renal disease: Secondary | ICD-10-CM | POA: Diagnosis not present

## 2023-09-29 DIAGNOSIS — Z992 Dependence on renal dialysis: Secondary | ICD-10-CM | POA: Diagnosis not present

## 2023-09-29 DIAGNOSIS — N2581 Secondary hyperparathyroidism of renal origin: Secondary | ICD-10-CM | POA: Diagnosis not present

## 2023-09-29 DIAGNOSIS — N186 End stage renal disease: Secondary | ICD-10-CM | POA: Diagnosis not present

## 2023-10-01 DIAGNOSIS — Z992 Dependence on renal dialysis: Secondary | ICD-10-CM | POA: Diagnosis not present

## 2023-10-01 DIAGNOSIS — N186 End stage renal disease: Secondary | ICD-10-CM | POA: Diagnosis not present

## 2023-10-01 DIAGNOSIS — N2581 Secondary hyperparathyroidism of renal origin: Secondary | ICD-10-CM | POA: Diagnosis not present

## 2023-10-03 DIAGNOSIS — Z992 Dependence on renal dialysis: Secondary | ICD-10-CM | POA: Diagnosis not present

## 2023-10-03 DIAGNOSIS — N2581 Secondary hyperparathyroidism of renal origin: Secondary | ICD-10-CM | POA: Diagnosis not present

## 2023-10-03 DIAGNOSIS — N186 End stage renal disease: Secondary | ICD-10-CM | POA: Diagnosis not present

## 2023-10-05 DIAGNOSIS — Z0000 Encounter for general adult medical examination without abnormal findings: Secondary | ICD-10-CM | POA: Diagnosis not present

## 2023-10-06 DIAGNOSIS — N186 End stage renal disease: Secondary | ICD-10-CM | POA: Diagnosis not present

## 2023-10-06 DIAGNOSIS — Z992 Dependence on renal dialysis: Secondary | ICD-10-CM | POA: Diagnosis not present

## 2023-10-06 DIAGNOSIS — N2581 Secondary hyperparathyroidism of renal origin: Secondary | ICD-10-CM | POA: Diagnosis not present

## 2023-10-08 DIAGNOSIS — N2581 Secondary hyperparathyroidism of renal origin: Secondary | ICD-10-CM | POA: Diagnosis not present

## 2023-10-08 DIAGNOSIS — Z992 Dependence on renal dialysis: Secondary | ICD-10-CM | POA: Diagnosis not present

## 2023-10-08 DIAGNOSIS — N186 End stage renal disease: Secondary | ICD-10-CM | POA: Diagnosis not present

## 2023-10-10 DIAGNOSIS — N2581 Secondary hyperparathyroidism of renal origin: Secondary | ICD-10-CM | POA: Diagnosis not present

## 2023-10-10 DIAGNOSIS — Z992 Dependence on renal dialysis: Secondary | ICD-10-CM | POA: Diagnosis not present

## 2023-10-10 DIAGNOSIS — N186 End stage renal disease: Secondary | ICD-10-CM | POA: Diagnosis not present

## 2023-10-13 DIAGNOSIS — Z992 Dependence on renal dialysis: Secondary | ICD-10-CM | POA: Diagnosis not present

## 2023-10-13 DIAGNOSIS — N2581 Secondary hyperparathyroidism of renal origin: Secondary | ICD-10-CM | POA: Diagnosis not present

## 2023-10-13 DIAGNOSIS — N186 End stage renal disease: Secondary | ICD-10-CM | POA: Diagnosis not present

## 2023-10-15 DIAGNOSIS — N2581 Secondary hyperparathyroidism of renal origin: Secondary | ICD-10-CM | POA: Diagnosis not present

## 2023-10-15 DIAGNOSIS — Z992 Dependence on renal dialysis: Secondary | ICD-10-CM | POA: Diagnosis not present

## 2023-10-15 DIAGNOSIS — N186 End stage renal disease: Secondary | ICD-10-CM | POA: Diagnosis not present

## 2023-10-16 DIAGNOSIS — N186 End stage renal disease: Secondary | ICD-10-CM | POA: Diagnosis not present

## 2023-10-16 DIAGNOSIS — I129 Hypertensive chronic kidney disease with stage 1 through stage 4 chronic kidney disease, or unspecified chronic kidney disease: Secondary | ICD-10-CM | POA: Diagnosis not present

## 2023-10-16 DIAGNOSIS — Z992 Dependence on renal dialysis: Secondary | ICD-10-CM | POA: Diagnosis not present

## 2023-10-17 DIAGNOSIS — Z992 Dependence on renal dialysis: Secondary | ICD-10-CM | POA: Diagnosis not present

## 2023-10-17 DIAGNOSIS — N186 End stage renal disease: Secondary | ICD-10-CM | POA: Diagnosis not present

## 2023-10-17 DIAGNOSIS — N2581 Secondary hyperparathyroidism of renal origin: Secondary | ICD-10-CM | POA: Diagnosis not present

## 2023-10-20 DIAGNOSIS — N2581 Secondary hyperparathyroidism of renal origin: Secondary | ICD-10-CM | POA: Diagnosis not present

## 2023-10-20 DIAGNOSIS — Z992 Dependence on renal dialysis: Secondary | ICD-10-CM | POA: Diagnosis not present

## 2023-10-20 DIAGNOSIS — N186 End stage renal disease: Secondary | ICD-10-CM | POA: Diagnosis not present

## 2023-10-22 DIAGNOSIS — N2581 Secondary hyperparathyroidism of renal origin: Secondary | ICD-10-CM | POA: Diagnosis not present

## 2023-10-22 DIAGNOSIS — Z992 Dependence on renal dialysis: Secondary | ICD-10-CM | POA: Diagnosis not present

## 2023-10-22 DIAGNOSIS — N186 End stage renal disease: Secondary | ICD-10-CM | POA: Diagnosis not present

## 2023-10-23 DIAGNOSIS — J329 Chronic sinusitis, unspecified: Secondary | ICD-10-CM | POA: Diagnosis not present

## 2023-10-23 DIAGNOSIS — I120 Hypertensive chronic kidney disease with stage 5 chronic kidney disease or end stage renal disease: Secondary | ICD-10-CM | POA: Diagnosis not present

## 2023-10-23 DIAGNOSIS — N186 End stage renal disease: Secondary | ICD-10-CM | POA: Diagnosis not present

## 2023-10-23 DIAGNOSIS — E6609 Other obesity due to excess calories: Secondary | ICD-10-CM | POA: Diagnosis not present

## 2023-10-23 DIAGNOSIS — E782 Mixed hyperlipidemia: Secondary | ICD-10-CM | POA: Diagnosis not present

## 2023-10-24 DIAGNOSIS — N2581 Secondary hyperparathyroidism of renal origin: Secondary | ICD-10-CM | POA: Diagnosis not present

## 2023-10-24 DIAGNOSIS — N186 End stage renal disease: Secondary | ICD-10-CM | POA: Diagnosis not present

## 2023-10-24 DIAGNOSIS — Z992 Dependence on renal dialysis: Secondary | ICD-10-CM | POA: Diagnosis not present

## 2023-10-27 DIAGNOSIS — N2581 Secondary hyperparathyroidism of renal origin: Secondary | ICD-10-CM | POA: Diagnosis not present

## 2023-10-27 DIAGNOSIS — Z992 Dependence on renal dialysis: Secondary | ICD-10-CM | POA: Diagnosis not present

## 2023-10-27 DIAGNOSIS — N186 End stage renal disease: Secondary | ICD-10-CM | POA: Diagnosis not present

## 2023-10-29 DIAGNOSIS — N2581 Secondary hyperparathyroidism of renal origin: Secondary | ICD-10-CM | POA: Diagnosis not present

## 2023-10-29 DIAGNOSIS — Z992 Dependence on renal dialysis: Secondary | ICD-10-CM | POA: Diagnosis not present

## 2023-10-29 DIAGNOSIS — N186 End stage renal disease: Secondary | ICD-10-CM | POA: Diagnosis not present

## 2023-10-31 DIAGNOSIS — N186 End stage renal disease: Secondary | ICD-10-CM | POA: Diagnosis not present

## 2023-10-31 DIAGNOSIS — N2581 Secondary hyperparathyroidism of renal origin: Secondary | ICD-10-CM | POA: Diagnosis not present

## 2023-10-31 DIAGNOSIS — Z992 Dependence on renal dialysis: Secondary | ICD-10-CM | POA: Diagnosis not present

## 2023-11-03 DIAGNOSIS — Z992 Dependence on renal dialysis: Secondary | ICD-10-CM | POA: Diagnosis not present

## 2023-11-03 DIAGNOSIS — N186 End stage renal disease: Secondary | ICD-10-CM | POA: Diagnosis not present

## 2023-11-03 DIAGNOSIS — N2581 Secondary hyperparathyroidism of renal origin: Secondary | ICD-10-CM | POA: Diagnosis not present

## 2023-11-05 DIAGNOSIS — Z992 Dependence on renal dialysis: Secondary | ICD-10-CM | POA: Diagnosis not present

## 2023-11-05 DIAGNOSIS — N2581 Secondary hyperparathyroidism of renal origin: Secondary | ICD-10-CM | POA: Diagnosis not present

## 2023-11-05 DIAGNOSIS — N186 End stage renal disease: Secondary | ICD-10-CM | POA: Diagnosis not present

## 2023-11-07 DIAGNOSIS — N186 End stage renal disease: Secondary | ICD-10-CM | POA: Diagnosis not present

## 2023-11-07 DIAGNOSIS — N2581 Secondary hyperparathyroidism of renal origin: Secondary | ICD-10-CM | POA: Diagnosis not present

## 2023-11-07 DIAGNOSIS — Z992 Dependence on renal dialysis: Secondary | ICD-10-CM | POA: Diagnosis not present

## 2023-11-10 DIAGNOSIS — Z992 Dependence on renal dialysis: Secondary | ICD-10-CM | POA: Diagnosis not present

## 2023-11-10 DIAGNOSIS — N186 End stage renal disease: Secondary | ICD-10-CM | POA: Diagnosis not present

## 2023-11-10 DIAGNOSIS — N2581 Secondary hyperparathyroidism of renal origin: Secondary | ICD-10-CM | POA: Diagnosis not present

## 2023-11-12 DIAGNOSIS — N186 End stage renal disease: Secondary | ICD-10-CM | POA: Diagnosis not present

## 2023-11-12 DIAGNOSIS — N2581 Secondary hyperparathyroidism of renal origin: Secondary | ICD-10-CM | POA: Diagnosis not present

## 2023-11-12 DIAGNOSIS — Z992 Dependence on renal dialysis: Secondary | ICD-10-CM | POA: Diagnosis not present

## 2023-11-14 DIAGNOSIS — N186 End stage renal disease: Secondary | ICD-10-CM | POA: Diagnosis not present

## 2023-11-14 DIAGNOSIS — N2581 Secondary hyperparathyroidism of renal origin: Secondary | ICD-10-CM | POA: Diagnosis not present

## 2023-11-14 DIAGNOSIS — Z992 Dependence on renal dialysis: Secondary | ICD-10-CM | POA: Diagnosis not present

## 2023-11-16 DIAGNOSIS — N186 End stage renal disease: Secondary | ICD-10-CM | POA: Diagnosis not present

## 2023-11-16 DIAGNOSIS — I129 Hypertensive chronic kidney disease with stage 1 through stage 4 chronic kidney disease, or unspecified chronic kidney disease: Secondary | ICD-10-CM | POA: Diagnosis not present

## 2023-11-16 DIAGNOSIS — Z992 Dependence on renal dialysis: Secondary | ICD-10-CM | POA: Diagnosis not present

## 2023-11-17 DIAGNOSIS — N186 End stage renal disease: Secondary | ICD-10-CM | POA: Diagnosis not present

## 2023-11-17 DIAGNOSIS — N2581 Secondary hyperparathyroidism of renal origin: Secondary | ICD-10-CM | POA: Diagnosis not present

## 2023-11-17 DIAGNOSIS — Z992 Dependence on renal dialysis: Secondary | ICD-10-CM | POA: Diagnosis not present

## 2023-11-19 DIAGNOSIS — N186 End stage renal disease: Secondary | ICD-10-CM | POA: Diagnosis not present

## 2023-11-19 DIAGNOSIS — Z992 Dependence on renal dialysis: Secondary | ICD-10-CM | POA: Diagnosis not present

## 2023-11-19 DIAGNOSIS — N2581 Secondary hyperparathyroidism of renal origin: Secondary | ICD-10-CM | POA: Diagnosis not present

## 2023-11-21 DIAGNOSIS — N2581 Secondary hyperparathyroidism of renal origin: Secondary | ICD-10-CM | POA: Diagnosis not present

## 2023-11-21 DIAGNOSIS — N186 End stage renal disease: Secondary | ICD-10-CM | POA: Diagnosis not present

## 2023-11-21 DIAGNOSIS — Z992 Dependence on renal dialysis: Secondary | ICD-10-CM | POA: Diagnosis not present

## 2023-11-24 DIAGNOSIS — N2581 Secondary hyperparathyroidism of renal origin: Secondary | ICD-10-CM | POA: Diagnosis not present

## 2023-11-24 DIAGNOSIS — Z992 Dependence on renal dialysis: Secondary | ICD-10-CM | POA: Diagnosis not present

## 2023-11-24 DIAGNOSIS — N186 End stage renal disease: Secondary | ICD-10-CM | POA: Diagnosis not present

## 2023-11-26 DIAGNOSIS — Z992 Dependence on renal dialysis: Secondary | ICD-10-CM | POA: Diagnosis not present

## 2023-11-26 DIAGNOSIS — N2581 Secondary hyperparathyroidism of renal origin: Secondary | ICD-10-CM | POA: Diagnosis not present

## 2023-11-26 DIAGNOSIS — N186 End stage renal disease: Secondary | ICD-10-CM | POA: Diagnosis not present

## 2023-11-28 DIAGNOSIS — Z992 Dependence on renal dialysis: Secondary | ICD-10-CM | POA: Diagnosis not present

## 2023-11-28 DIAGNOSIS — N186 End stage renal disease: Secondary | ICD-10-CM | POA: Diagnosis not present

## 2023-11-28 DIAGNOSIS — N2581 Secondary hyperparathyroidism of renal origin: Secondary | ICD-10-CM | POA: Diagnosis not present

## 2023-12-01 DIAGNOSIS — Z992 Dependence on renal dialysis: Secondary | ICD-10-CM | POA: Diagnosis not present

## 2023-12-01 DIAGNOSIS — N186 End stage renal disease: Secondary | ICD-10-CM | POA: Diagnosis not present

## 2023-12-01 DIAGNOSIS — N2581 Secondary hyperparathyroidism of renal origin: Secondary | ICD-10-CM | POA: Diagnosis not present

## 2023-12-03 DIAGNOSIS — Z992 Dependence on renal dialysis: Secondary | ICD-10-CM | POA: Diagnosis not present

## 2023-12-03 DIAGNOSIS — N2581 Secondary hyperparathyroidism of renal origin: Secondary | ICD-10-CM | POA: Diagnosis not present

## 2023-12-03 DIAGNOSIS — N186 End stage renal disease: Secondary | ICD-10-CM | POA: Diagnosis not present

## 2023-12-05 DIAGNOSIS — N2581 Secondary hyperparathyroidism of renal origin: Secondary | ICD-10-CM | POA: Diagnosis not present

## 2023-12-05 DIAGNOSIS — Z992 Dependence on renal dialysis: Secondary | ICD-10-CM | POA: Diagnosis not present

## 2023-12-05 DIAGNOSIS — N186 End stage renal disease: Secondary | ICD-10-CM | POA: Diagnosis not present

## 2023-12-07 DIAGNOSIS — I120 Hypertensive chronic kidney disease with stage 5 chronic kidney disease or end stage renal disease: Secondary | ICD-10-CM | POA: Diagnosis not present

## 2023-12-07 DIAGNOSIS — Z992 Dependence on renal dialysis: Secondary | ICD-10-CM | POA: Diagnosis not present

## 2023-12-07 DIAGNOSIS — I770 Arteriovenous fistula, acquired: Secondary | ICD-10-CM | POA: Diagnosis not present

## 2023-12-07 DIAGNOSIS — N186 End stage renal disease: Secondary | ICD-10-CM | POA: Diagnosis not present

## 2023-12-07 DIAGNOSIS — J301 Allergic rhinitis due to pollen: Secondary | ICD-10-CM | POA: Diagnosis not present

## 2023-12-08 DIAGNOSIS — Z992 Dependence on renal dialysis: Secondary | ICD-10-CM | POA: Diagnosis not present

## 2023-12-08 DIAGNOSIS — N186 End stage renal disease: Secondary | ICD-10-CM | POA: Diagnosis not present

## 2023-12-08 DIAGNOSIS — N2581 Secondary hyperparathyroidism of renal origin: Secondary | ICD-10-CM | POA: Diagnosis not present

## 2023-12-10 DIAGNOSIS — N186 End stage renal disease: Secondary | ICD-10-CM | POA: Diagnosis not present

## 2023-12-10 DIAGNOSIS — Z992 Dependence on renal dialysis: Secondary | ICD-10-CM | POA: Diagnosis not present

## 2023-12-10 DIAGNOSIS — N2581 Secondary hyperparathyroidism of renal origin: Secondary | ICD-10-CM | POA: Diagnosis not present

## 2023-12-12 DIAGNOSIS — N186 End stage renal disease: Secondary | ICD-10-CM | POA: Diagnosis not present

## 2023-12-12 DIAGNOSIS — N2581 Secondary hyperparathyroidism of renal origin: Secondary | ICD-10-CM | POA: Diagnosis not present

## 2023-12-12 DIAGNOSIS — Z992 Dependence on renal dialysis: Secondary | ICD-10-CM | POA: Diagnosis not present

## 2023-12-15 DIAGNOSIS — Z992 Dependence on renal dialysis: Secondary | ICD-10-CM | POA: Diagnosis not present

## 2023-12-15 DIAGNOSIS — N2581 Secondary hyperparathyroidism of renal origin: Secondary | ICD-10-CM | POA: Diagnosis not present

## 2023-12-15 DIAGNOSIS — N186 End stage renal disease: Secondary | ICD-10-CM | POA: Diagnosis not present

## 2023-12-16 DIAGNOSIS — Z992 Dependence on renal dialysis: Secondary | ICD-10-CM | POA: Diagnosis not present

## 2023-12-16 DIAGNOSIS — I129 Hypertensive chronic kidney disease with stage 1 through stage 4 chronic kidney disease, or unspecified chronic kidney disease: Secondary | ICD-10-CM | POA: Diagnosis not present

## 2023-12-16 DIAGNOSIS — N186 End stage renal disease: Secondary | ICD-10-CM | POA: Diagnosis not present

## 2023-12-17 DIAGNOSIS — N186 End stage renal disease: Secondary | ICD-10-CM | POA: Diagnosis not present

## 2023-12-17 DIAGNOSIS — Z992 Dependence on renal dialysis: Secondary | ICD-10-CM | POA: Diagnosis not present

## 2023-12-17 DIAGNOSIS — N2581 Secondary hyperparathyroidism of renal origin: Secondary | ICD-10-CM | POA: Diagnosis not present

## 2023-12-19 DIAGNOSIS — N2581 Secondary hyperparathyroidism of renal origin: Secondary | ICD-10-CM | POA: Diagnosis not present

## 2023-12-19 DIAGNOSIS — N186 End stage renal disease: Secondary | ICD-10-CM | POA: Diagnosis not present

## 2023-12-19 DIAGNOSIS — Z992 Dependence on renal dialysis: Secondary | ICD-10-CM | POA: Diagnosis not present

## 2023-12-22 DIAGNOSIS — Z992 Dependence on renal dialysis: Secondary | ICD-10-CM | POA: Diagnosis not present

## 2023-12-22 DIAGNOSIS — N186 End stage renal disease: Secondary | ICD-10-CM | POA: Diagnosis not present

## 2023-12-22 DIAGNOSIS — N2581 Secondary hyperparathyroidism of renal origin: Secondary | ICD-10-CM | POA: Diagnosis not present

## 2023-12-24 DIAGNOSIS — Z992 Dependence on renal dialysis: Secondary | ICD-10-CM | POA: Diagnosis not present

## 2023-12-24 DIAGNOSIS — N186 End stage renal disease: Secondary | ICD-10-CM | POA: Diagnosis not present

## 2023-12-24 DIAGNOSIS — N2581 Secondary hyperparathyroidism of renal origin: Secondary | ICD-10-CM | POA: Diagnosis not present

## 2023-12-26 DIAGNOSIS — Z992 Dependence on renal dialysis: Secondary | ICD-10-CM | POA: Diagnosis not present

## 2023-12-26 DIAGNOSIS — N186 End stage renal disease: Secondary | ICD-10-CM | POA: Diagnosis not present

## 2023-12-26 DIAGNOSIS — N2581 Secondary hyperparathyroidism of renal origin: Secondary | ICD-10-CM | POA: Diagnosis not present

## 2023-12-29 DIAGNOSIS — N2581 Secondary hyperparathyroidism of renal origin: Secondary | ICD-10-CM | POA: Diagnosis not present

## 2023-12-29 DIAGNOSIS — N186 End stage renal disease: Secondary | ICD-10-CM | POA: Diagnosis not present

## 2023-12-29 DIAGNOSIS — Z992 Dependence on renal dialysis: Secondary | ICD-10-CM | POA: Diagnosis not present

## 2023-12-31 DIAGNOSIS — N186 End stage renal disease: Secondary | ICD-10-CM | POA: Diagnosis not present

## 2023-12-31 DIAGNOSIS — N2581 Secondary hyperparathyroidism of renal origin: Secondary | ICD-10-CM | POA: Diagnosis not present

## 2023-12-31 DIAGNOSIS — Z992 Dependence on renal dialysis: Secondary | ICD-10-CM | POA: Diagnosis not present

## 2024-01-02 DIAGNOSIS — Z992 Dependence on renal dialysis: Secondary | ICD-10-CM | POA: Diagnosis not present

## 2024-01-02 DIAGNOSIS — N186 End stage renal disease: Secondary | ICD-10-CM | POA: Diagnosis not present

## 2024-01-02 DIAGNOSIS — N2581 Secondary hyperparathyroidism of renal origin: Secondary | ICD-10-CM | POA: Diagnosis not present

## 2024-01-05 DIAGNOSIS — Z992 Dependence on renal dialysis: Secondary | ICD-10-CM | POA: Diagnosis not present

## 2024-01-05 DIAGNOSIS — N2581 Secondary hyperparathyroidism of renal origin: Secondary | ICD-10-CM | POA: Diagnosis not present

## 2024-01-05 DIAGNOSIS — N186 End stage renal disease: Secondary | ICD-10-CM | POA: Diagnosis not present

## 2024-01-07 DIAGNOSIS — N2581 Secondary hyperparathyroidism of renal origin: Secondary | ICD-10-CM | POA: Diagnosis not present

## 2024-01-07 DIAGNOSIS — N186 End stage renal disease: Secondary | ICD-10-CM | POA: Diagnosis not present

## 2024-01-07 DIAGNOSIS — Z992 Dependence on renal dialysis: Secondary | ICD-10-CM | POA: Diagnosis not present

## 2024-01-09 DIAGNOSIS — Z992 Dependence on renal dialysis: Secondary | ICD-10-CM | POA: Diagnosis not present

## 2024-01-09 DIAGNOSIS — N186 End stage renal disease: Secondary | ICD-10-CM | POA: Diagnosis not present

## 2024-01-09 DIAGNOSIS — N2581 Secondary hyperparathyroidism of renal origin: Secondary | ICD-10-CM | POA: Diagnosis not present

## 2024-01-11 DIAGNOSIS — N2581 Secondary hyperparathyroidism of renal origin: Secondary | ICD-10-CM | POA: Diagnosis not present

## 2024-01-11 DIAGNOSIS — Z992 Dependence on renal dialysis: Secondary | ICD-10-CM | POA: Diagnosis not present

## 2024-01-11 DIAGNOSIS — N186 End stage renal disease: Secondary | ICD-10-CM | POA: Diagnosis not present

## 2024-01-12 DIAGNOSIS — N2581 Secondary hyperparathyroidism of renal origin: Secondary | ICD-10-CM | POA: Diagnosis not present

## 2024-01-12 DIAGNOSIS — Z992 Dependence on renal dialysis: Secondary | ICD-10-CM | POA: Diagnosis not present

## 2024-01-12 DIAGNOSIS — N186 End stage renal disease: Secondary | ICD-10-CM | POA: Diagnosis not present

## 2024-01-14 DIAGNOSIS — N186 End stage renal disease: Secondary | ICD-10-CM | POA: Diagnosis not present

## 2024-01-14 DIAGNOSIS — N2581 Secondary hyperparathyroidism of renal origin: Secondary | ICD-10-CM | POA: Diagnosis not present

## 2024-01-14 DIAGNOSIS — Z992 Dependence on renal dialysis: Secondary | ICD-10-CM | POA: Diagnosis not present

## 2024-01-15 ENCOUNTER — Encounter (HOSPITAL_COMMUNITY)
Admission: RE | Disposition: A | Discharge status: Home / Self Care | Payer: Self-pay | Source: Home / Self Care | Attending: Surgery

## 2024-01-15 ENCOUNTER — Ambulatory Visit (HOSPITAL_COMMUNITY)
Admission: RE | Admit: 2024-01-15 | Discharge: 2024-01-15 | Disposition: A | Discharge status: Home / Self Care | Attending: Surgery | Admitting: Surgery

## 2024-01-15 ENCOUNTER — Other Ambulatory Visit: Payer: Self-pay

## 2024-01-15 DIAGNOSIS — I953 Hypotension of hemodialysis: Secondary | ICD-10-CM | POA: Diagnosis not present

## 2024-01-15 DIAGNOSIS — I120 Hypertensive chronic kidney disease with stage 5 chronic kidney disease or end stage renal disease: Secondary | ICD-10-CM | POA: Diagnosis not present

## 2024-01-15 DIAGNOSIS — T82858A Stenosis of vascular prosthetic devices, implants and grafts, initial encounter: Secondary | ICD-10-CM | POA: Diagnosis not present

## 2024-01-15 DIAGNOSIS — M79642 Pain in left hand: Secondary | ICD-10-CM | POA: Diagnosis not present

## 2024-01-15 DIAGNOSIS — Y832 Surgical operation with anastomosis, bypass or graft as the cause of abnormal reaction of the patient, or of later complication, without mention of misadventure at the time of the procedure: Secondary | ICD-10-CM | POA: Diagnosis not present

## 2024-01-15 DIAGNOSIS — Z87891 Personal history of nicotine dependence: Secondary | ICD-10-CM | POA: Insufficient documentation

## 2024-01-15 DIAGNOSIS — T82898A Other specified complication of vascular prosthetic devices, implants and grafts, initial encounter: Secondary | ICD-10-CM

## 2024-01-15 DIAGNOSIS — N186 End stage renal disease: Secondary | ICD-10-CM | POA: Diagnosis not present

## 2024-01-15 DIAGNOSIS — Z992 Dependence on renal dialysis: Secondary | ICD-10-CM

## 2024-01-15 DIAGNOSIS — N184 Chronic kidney disease, stage 4 (severe): Secondary | ICD-10-CM

## 2024-01-15 HISTORY — PX: A/V SHUNT INTERVENTION: CATH118220

## 2024-01-15 HISTORY — PX: VENOUS ANGIOPLASTY: CATH118376

## 2024-01-15 SURGERY — A/V SHUNT INTERVENTION
Anesthesia: LOCAL

## 2024-01-15 MED ORDER — IODIXANOL 320 MG/ML IV SOLN
INTRAVENOUS | Status: DC | PRN
  Administered 2024-01-15: 35 mL via INTRA_ARTERIAL

## 2024-01-15 MED ORDER — LIDOCAINE HCL (PF) 1 % IJ SOLN
INTRAMUSCULAR | Status: DC | PRN
  Administered 2024-01-15: 5 mL via SUBCUTANEOUS

## 2024-01-15 MED ORDER — HEPARIN (PORCINE) IN NACL 1000-0.9 UT/500ML-% IV SOLN
INTRAVENOUS | Status: DC | PRN
  Administered 2024-01-15: 500 mL

## 2024-01-15 SURGICAL SUPPLY — 8 items
BALLOON MUSTANG 8X80X75 (BALLOONS) IMPLANT
KIT ENCORE 40 (KITS) IMPLANT
KIT MICROPUNCTURE NIT STIFF (SHEATH) IMPLANT
SHEATH PINNACLE R/O II 6F 4CM (SHEATH) IMPLANT
SHEATH PROBE COVER 6X72 (BAG) IMPLANT
STOPCOCK MORSE 400PSI 3WAY (MISCELLANEOUS) IMPLANT
TRAY PV CATH (CUSTOM PROCEDURE TRAY) ×2 IMPLANT
WIRE BENTSON .035X145CM (WIRE) IMPLANT

## 2024-01-15 NOTE — Op Note (Signed)
    Patient name: [name] Downs MRN: [id number] DOB: 1965/08/30 Sex: male  01/15/2024 Pre-operative Diagnosis: ESRD Post-operative diagnosis:  Same Surgeon:  Gareld June Procedure Performed:  1.  Ultrasound-guided access, left arm fistula  2.  Fistulogram  3.  Peripheral venoplasty, basilic vein fistula   Indications: 58 year old gentleman who was having trouble with his access and comes in today for fistulogram  Procedure:  The patient was identified in the holding area and taken to room 8.  The patient was then placed supine on the table and prepped and draped in the usual sterile fashion.  A time out was called.  Ultrasound was used to evaluate the fistula.  The vein was patent and compressible.  A digital ultrasound image was acquired.  The fistula was then accessed under ultrasound guidance using a micropuncture needle.  An 018 wire was then asvanced without resistance and a micropuncture sheath was placed.  Contrast injections were then performed through the sheath.  Findings: No evidence of central venous stenosis.  The arteriovenous anastomosis was widely patent.  In the proximal portion of the fistula there are aneurysmal segments with a bridging component of the fistula showing some narrowing which tracks back to near the arteriovenous anastomosis.  Stenosis is approximately 50 to 60%   Intervention: After the above images were acquired the decision was made to proceed with intervention.  A 6 French sheath was placed.  A Bentson wire was advanced across the stenosis.  A 8 x 80 balloon was used to perform peripheral venoplasty of the basilic vein.  After abdominal inflations, stenosis was less than 20%.  Sheath was removed with 4-0 Monocryl  Impression:  #1  Successful venoplasty of stenosis within the proximal basilic vein fistula using an 8 mm balloon  #2  Access remains amenable to percutaneous intervention    V. Gareld June, M.D., [facility]  [place] Office: [phone] Pager:  [phone]

## 2024-01-15 NOTE — H&P (Signed)
 [facility]ist of [facility]  Patient name: [name] Downs MRN: [id number] DOB: 1966/03/19 Sex: male   REASON FOR VISIT:    Dialysis access complications  HISOTRY OF PRESENT ILLNESS:    [name] Downs is a 58 y.o. male who is status post left basilic vein fistula creation on 11/23/2019.He underwent successful venoplasty in February 2025.He is having pain over his fustula and low flow rates.  He is also hypotensive with HD and complains of pain in his left hand   PAST MEDICAL HISTORY:   Past Medical History:  Diagnosis Date   Aortic dissection (HCC)    type B   Brain aneurysm    Chronic kidney disease    Deafness in left ear    Gout    Hypertension    Medically noncompliant    Sleep apnea     does not wears CPAP   Wears glasses      FAMILY HISTORY:   Family History  Problem Relation Age of Onset   Alzheimer's disease Mother    Cirrhosis Father    Heart attack Brother     SOCIAL HISTORY:   Social History   Tobacco Use   Smoking status: Former    Current packs/day: 0.25    Average packs/day: 0.3 packs/day for 20.0 years (5.0 ttl pk-yrs)    Types: Cigarettes   Smokeless tobacco: Never  Substance Use Topics   Alcohol use: No    Alcohol/week: 0.0 standard drinks of alcohol     ALLERGIES:   No Known Allergies   CURRENT MEDICATIONS:   No current facility-administered medications for this encounter.    REVIEW OF SYSTEMS:   [X]  denotes positive finding, [ ]  denotes negative finding Cardiac  Comments:  Chest pain or chest pressure:    Shortness of breath upon exertion:    Short of breath when lying flat:    Irregular heart rhythm:        Vascular    Pain in calf, thigh, or hip brought on by ambulation:    Pain in feet at night that wakes you up from your sleep:     Blood clot in your veins:    Leg swelling:         Pulmonary    Oxygen at home:    Productive cough:     Wheezing:         Neurologic     Sudden weakness in arms or legs:     Sudden numbness in arms or legs:     Sudden onset of difficulty speaking or slurred speech:    Temporary loss of vision in one eye:     Problems with dizziness:         Gastrointestinal    Blood in stool:     Vomited blood:         Genitourinary    Burning when urinating:     Blood in urine:        Psychiatric    Major depression:         Hematologic    Bleeding problems:    Problems with blood clotting too easily:        Skin    Rashes or ulcers:        Constitutional    Fever or chills:      PHYSICAL EXAM:   Vitals:   01/15/24 1017 01/15/24 1035  BP: 118/72   Pulse: 88 80  Resp: 12   Temp: 97.9 F (36.6  C)   TempSrc: Oral   SpO2: 96% 95%  Weight:  114.8 kg  Height:  6' (1.829 m)    GENERAL: The patient is a well-nourished male, in no acute distress. The vital signs are documented above. CARDIAC: There is a regular rate and rhythm.  VASCULAR: palpable thrill in left arm AVF with 2 small aneurysms PULMONARY: Non-labored respirations MUSCULOSKELETAL: There are no major deformities or cyanosis. NEUROLOGIC: No focal weakness or paresthesias are detected. SKIN: There are no ulcers or rashes noted. PSYCHIATRIC: The patient has a normal affect.  STUDIES:   none  MEDICAL ISSUES:   ESRD: I discussed proceeding with a fistulogram to better evaluate his access.  I described the details of the procedure.  We talked about intervention if indicated.  All questions were answered.    Marti Slates, MD, FACS [facility] [phone] Pager [phone]

## 2024-01-16 DIAGNOSIS — I129 Hypertensive chronic kidney disease with stage 1 through stage 4 chronic kidney disease, or unspecified chronic kidney disease: Secondary | ICD-10-CM | POA: Diagnosis not present

## 2024-01-16 DIAGNOSIS — N2581 Secondary hyperparathyroidism of renal origin: Secondary | ICD-10-CM | POA: Diagnosis not present

## 2024-01-16 DIAGNOSIS — Z992 Dependence on renal dialysis: Secondary | ICD-10-CM | POA: Diagnosis not present

## 2024-01-16 DIAGNOSIS — N186 End stage renal disease: Secondary | ICD-10-CM | POA: Diagnosis not present

## 2024-01-18 ENCOUNTER — Encounter (HOSPITAL_COMMUNITY): Payer: Self-pay | Admitting: Surgery

## 2024-01-19 DIAGNOSIS — Z992 Dependence on renal dialysis: Secondary | ICD-10-CM | POA: Diagnosis not present

## 2024-01-19 DIAGNOSIS — N186 End stage renal disease: Secondary | ICD-10-CM | POA: Diagnosis not present

## 2024-01-19 DIAGNOSIS — N2581 Secondary hyperparathyroidism of renal origin: Secondary | ICD-10-CM | POA: Diagnosis not present

## 2024-01-21 DIAGNOSIS — N186 End stage renal disease: Secondary | ICD-10-CM | POA: Diagnosis not present

## 2024-01-21 DIAGNOSIS — Z992 Dependence on renal dialysis: Secondary | ICD-10-CM | POA: Diagnosis not present

## 2024-01-21 DIAGNOSIS — N2581 Secondary hyperparathyroidism of renal origin: Secondary | ICD-10-CM | POA: Diagnosis not present

## 2024-01-23 DIAGNOSIS — Z992 Dependence on renal dialysis: Secondary | ICD-10-CM | POA: Diagnosis not present

## 2024-01-23 DIAGNOSIS — N186 End stage renal disease: Secondary | ICD-10-CM | POA: Diagnosis not present

## 2024-01-23 DIAGNOSIS — N2581 Secondary hyperparathyroidism of renal origin: Secondary | ICD-10-CM | POA: Diagnosis not present

## 2024-01-26 DIAGNOSIS — Z992 Dependence on renal dialysis: Secondary | ICD-10-CM | POA: Diagnosis not present

## 2024-01-26 DIAGNOSIS — N2581 Secondary hyperparathyroidism of renal origin: Secondary | ICD-10-CM | POA: Diagnosis not present

## 2024-01-26 DIAGNOSIS — N186 End stage renal disease: Secondary | ICD-10-CM | POA: Diagnosis not present

## 2024-01-28 DIAGNOSIS — N2581 Secondary hyperparathyroidism of renal origin: Secondary | ICD-10-CM | POA: Diagnosis not present

## 2024-01-28 DIAGNOSIS — N186 End stage renal disease: Secondary | ICD-10-CM | POA: Diagnosis not present

## 2024-01-28 DIAGNOSIS — Z992 Dependence on renal dialysis: Secondary | ICD-10-CM | POA: Diagnosis not present

## 2024-01-30 DIAGNOSIS — N2581 Secondary hyperparathyroidism of renal origin: Secondary | ICD-10-CM | POA: Diagnosis not present

## 2024-01-30 DIAGNOSIS — Z992 Dependence on renal dialysis: Secondary | ICD-10-CM | POA: Diagnosis not present

## 2024-01-30 DIAGNOSIS — N186 End stage renal disease: Secondary | ICD-10-CM | POA: Diagnosis not present

## 2024-02-02 DIAGNOSIS — Z992 Dependence on renal dialysis: Secondary | ICD-10-CM | POA: Diagnosis not present

## 2024-02-02 DIAGNOSIS — N186 End stage renal disease: Secondary | ICD-10-CM | POA: Diagnosis not present

## 2024-02-02 DIAGNOSIS — N2581 Secondary hyperparathyroidism of renal origin: Secondary | ICD-10-CM | POA: Diagnosis not present

## 2024-02-04 DIAGNOSIS — N186 End stage renal disease: Secondary | ICD-10-CM | POA: Diagnosis not present

## 2024-02-04 DIAGNOSIS — N2581 Secondary hyperparathyroidism of renal origin: Secondary | ICD-10-CM | POA: Diagnosis not present

## 2024-02-04 DIAGNOSIS — Z992 Dependence on renal dialysis: Secondary | ICD-10-CM | POA: Diagnosis not present

## 2024-02-06 DIAGNOSIS — N2581 Secondary hyperparathyroidism of renal origin: Secondary | ICD-10-CM | POA: Diagnosis not present

## 2024-02-06 DIAGNOSIS — N186 End stage renal disease: Secondary | ICD-10-CM | POA: Diagnosis not present

## 2024-02-06 DIAGNOSIS — Z992 Dependence on renal dialysis: Secondary | ICD-10-CM | POA: Diagnosis not present

## 2024-02-09 DIAGNOSIS — N2581 Secondary hyperparathyroidism of renal origin: Secondary | ICD-10-CM | POA: Diagnosis not present

## 2024-02-09 DIAGNOSIS — N186 End stage renal disease: Secondary | ICD-10-CM | POA: Diagnosis not present

## 2024-02-09 DIAGNOSIS — Z992 Dependence on renal dialysis: Secondary | ICD-10-CM | POA: Diagnosis not present

## 2024-02-11 DIAGNOSIS — N186 End stage renal disease: Secondary | ICD-10-CM | POA: Diagnosis not present

## 2024-02-11 DIAGNOSIS — Z992 Dependence on renal dialysis: Secondary | ICD-10-CM | POA: Diagnosis not present

## 2024-02-11 DIAGNOSIS — N2581 Secondary hyperparathyroidism of renal origin: Secondary | ICD-10-CM | POA: Diagnosis not present

## 2024-02-13 DIAGNOSIS — N186 End stage renal disease: Secondary | ICD-10-CM | POA: Diagnosis not present

## 2024-02-13 DIAGNOSIS — Z992 Dependence on renal dialysis: Secondary | ICD-10-CM | POA: Diagnosis not present

## 2024-02-13 DIAGNOSIS — N2581 Secondary hyperparathyroidism of renal origin: Secondary | ICD-10-CM | POA: Diagnosis not present

## 2024-02-15 DIAGNOSIS — N186 End stage renal disease: Secondary | ICD-10-CM | POA: Diagnosis not present

## 2024-02-15 DIAGNOSIS — Z992 Dependence on renal dialysis: Secondary | ICD-10-CM | POA: Diagnosis not present

## 2024-02-15 DIAGNOSIS — I129 Hypertensive chronic kidney disease with stage 1 through stage 4 chronic kidney disease, or unspecified chronic kidney disease: Secondary | ICD-10-CM | POA: Diagnosis not present

## 2024-02-16 DIAGNOSIS — N186 End stage renal disease: Secondary | ICD-10-CM | POA: Diagnosis not present

## 2024-02-16 DIAGNOSIS — Z992 Dependence on renal dialysis: Secondary | ICD-10-CM | POA: Diagnosis not present

## 2024-02-16 DIAGNOSIS — N2581 Secondary hyperparathyroidism of renal origin: Secondary | ICD-10-CM | POA: Diagnosis not present

## 2024-02-18 DIAGNOSIS — N186 End stage renal disease: Secondary | ICD-10-CM | POA: Diagnosis not present

## 2024-02-18 DIAGNOSIS — Z992 Dependence on renal dialysis: Secondary | ICD-10-CM | POA: Diagnosis not present

## 2024-02-18 DIAGNOSIS — N2581 Secondary hyperparathyroidism of renal origin: Secondary | ICD-10-CM | POA: Diagnosis not present

## 2024-02-20 DIAGNOSIS — Z992 Dependence on renal dialysis: Secondary | ICD-10-CM | POA: Diagnosis not present

## 2024-02-20 DIAGNOSIS — N186 End stage renal disease: Secondary | ICD-10-CM | POA: Diagnosis not present

## 2024-02-20 DIAGNOSIS — N2581 Secondary hyperparathyroidism of renal origin: Secondary | ICD-10-CM | POA: Diagnosis not present

## 2024-02-22 ENCOUNTER — Encounter: Payer: Self-pay | Admitting: Podiatry

## 2024-02-22 ENCOUNTER — Ambulatory Visit: Admitting: Podiatry

## 2024-02-22 DIAGNOSIS — D689 Coagulation defect, unspecified: Secondary | ICD-10-CM

## 2024-02-22 DIAGNOSIS — B351 Tinea unguium: Secondary | ICD-10-CM | POA: Diagnosis not present

## 2024-02-22 DIAGNOSIS — M79674 Pain in right toe(s): Secondary | ICD-10-CM | POA: Diagnosis not present

## 2024-02-22 DIAGNOSIS — M79675 Pain in left toe(s): Secondary | ICD-10-CM

## 2024-02-22 NOTE — Progress Notes (Signed)
  Subjective:  Patient ID: [name] Downs, male    DOB: 1966-03-04,   MRN: [id number]  Chief Complaint  Patient presents with   Nail Problem    Toe nail trim. Non diabetic. L1 great toe nail loose. 0 pain at present.     58 y.o. male presents for concern of thickened elongated and painful nails that are difficult to trim. Requesting to have them trimmed today. History of dialysis and coagulation defect and at risk for foot care.   PCP:  Benjamine Aland, MD    . Denies any other pedal complaints. Denies n/v/f/c.   Past Medical History:  Diagnosis Date   Aortic dissection (HCC)    type B   Brain aneurysm    Chronic kidney disease    Deafness in left ear    Gout    Hypertension    Medically noncompliant    Sleep apnea     does not wears CPAP   Wears glasses     Objective:  Physical Exam: Vascular: DP/PT pulses 2/4 bilateral. CFT <3 seconds. Absent hair growth on digits. Edema noted to bilateral lower extremities. Xerosis noted bilaterally.  Skin. No lacerations or abrasions bilateral feet. Nails 1-5 bilateral  are thickened discolored and elongated with subungual debris.  Musculoskeletal: MMT 5/5 bilateral lower extremities in DF, PF, Inversion and Eversion. Deceased ROM in DF of ankle joint.  Neurological: Sensation intact to light touch. Protective sensation  intact bilateral.    Assessment:   1. Pain due to onychomycosis of toenails of both feet   2. Coagulation defect, unspecified (HCC)      Plan:  Patient was evaluated and treated and all questions answered. -Mechanically debrided all nails 1-5 bilateral using sterile nail nipper and filed with dremel without incident  -Answered all patient questions -Patient to return  in 3 months for at risk foot care -Patient advised to call the office if any problems or questions arise in the meantime.   Asberry Failing, DPM

## 2024-02-23 DIAGNOSIS — N186 End stage renal disease: Secondary | ICD-10-CM | POA: Diagnosis not present

## 2024-02-23 DIAGNOSIS — N2581 Secondary hyperparathyroidism of renal origin: Secondary | ICD-10-CM | POA: Diagnosis not present

## 2024-02-23 DIAGNOSIS — Z992 Dependence on renal dialysis: Secondary | ICD-10-CM | POA: Diagnosis not present

## 2024-02-25 DIAGNOSIS — N2581 Secondary hyperparathyroidism of renal origin: Secondary | ICD-10-CM | POA: Diagnosis not present

## 2024-02-25 DIAGNOSIS — Z992 Dependence on renal dialysis: Secondary | ICD-10-CM | POA: Diagnosis not present

## 2024-02-25 DIAGNOSIS — N186 End stage renal disease: Secondary | ICD-10-CM | POA: Diagnosis not present

## 2024-02-27 DIAGNOSIS — Z992 Dependence on renal dialysis: Secondary | ICD-10-CM | POA: Diagnosis not present

## 2024-02-27 DIAGNOSIS — N2581 Secondary hyperparathyroidism of renal origin: Secondary | ICD-10-CM | POA: Diagnosis not present

## 2024-02-27 DIAGNOSIS — N186 End stage renal disease: Secondary | ICD-10-CM | POA: Diagnosis not present

## 2024-03-01 DIAGNOSIS — N186 End stage renal disease: Secondary | ICD-10-CM | POA: Diagnosis not present

## 2024-03-01 DIAGNOSIS — N2581 Secondary hyperparathyroidism of renal origin: Secondary | ICD-10-CM | POA: Diagnosis not present

## 2024-03-01 DIAGNOSIS — Z992 Dependence on renal dialysis: Secondary | ICD-10-CM | POA: Diagnosis not present

## 2024-03-03 DIAGNOSIS — R202 Paresthesia of skin: Secondary | ICD-10-CM | POA: Diagnosis not present

## 2024-03-03 DIAGNOSIS — N2581 Secondary hyperparathyroidism of renal origin: Secondary | ICD-10-CM | POA: Diagnosis not present

## 2024-03-03 DIAGNOSIS — N186 End stage renal disease: Secondary | ICD-10-CM | POA: Diagnosis not present

## 2024-03-03 DIAGNOSIS — R200 Anesthesia of skin: Secondary | ICD-10-CM | POA: Diagnosis not present

## 2024-03-03 DIAGNOSIS — Z992 Dependence on renal dialysis: Secondary | ICD-10-CM | POA: Diagnosis not present

## 2024-03-05 DIAGNOSIS — N2581 Secondary hyperparathyroidism of renal origin: Secondary | ICD-10-CM | POA: Diagnosis not present

## 2024-03-05 DIAGNOSIS — Z992 Dependence on renal dialysis: Secondary | ICD-10-CM | POA: Diagnosis not present

## 2024-03-05 DIAGNOSIS — N186 End stage renal disease: Secondary | ICD-10-CM | POA: Diagnosis not present

## 2024-03-07 DIAGNOSIS — N186 End stage renal disease: Secondary | ICD-10-CM | POA: Diagnosis not present

## 2024-03-07 DIAGNOSIS — Z992 Dependence on renal dialysis: Secondary | ICD-10-CM | POA: Diagnosis not present

## 2024-03-07 DIAGNOSIS — I120 Hypertensive chronic kidney disease with stage 5 chronic kidney disease or end stage renal disease: Secondary | ICD-10-CM | POA: Diagnosis not present

## 2024-03-07 DIAGNOSIS — E6609 Other obesity due to excess calories: Secondary | ICD-10-CM | POA: Diagnosis not present

## 2024-03-08 DIAGNOSIS — N186 End stage renal disease: Secondary | ICD-10-CM | POA: Diagnosis not present

## 2024-03-08 DIAGNOSIS — Z992 Dependence on renal dialysis: Secondary | ICD-10-CM | POA: Diagnosis not present

## 2024-03-08 DIAGNOSIS — N2581 Secondary hyperparathyroidism of renal origin: Secondary | ICD-10-CM | POA: Diagnosis not present

## 2024-03-10 DIAGNOSIS — N186 End stage renal disease: Secondary | ICD-10-CM | POA: Diagnosis not present

## 2024-03-10 DIAGNOSIS — N2581 Secondary hyperparathyroidism of renal origin: Secondary | ICD-10-CM | POA: Diagnosis not present

## 2024-03-10 DIAGNOSIS — Z992 Dependence on renal dialysis: Secondary | ICD-10-CM | POA: Diagnosis not present

## 2024-03-12 DIAGNOSIS — N2581 Secondary hyperparathyroidism of renal origin: Secondary | ICD-10-CM | POA: Diagnosis not present

## 2024-03-12 DIAGNOSIS — N186 End stage renal disease: Secondary | ICD-10-CM | POA: Diagnosis not present

## 2024-03-12 DIAGNOSIS — Z992 Dependence on renal dialysis: Secondary | ICD-10-CM | POA: Diagnosis not present

## 2024-03-15 DIAGNOSIS — N2581 Secondary hyperparathyroidism of renal origin: Secondary | ICD-10-CM | POA: Diagnosis not present

## 2024-03-15 DIAGNOSIS — N186 End stage renal disease: Secondary | ICD-10-CM | POA: Diagnosis not present

## 2024-03-15 DIAGNOSIS — Z992 Dependence on renal dialysis: Secondary | ICD-10-CM | POA: Diagnosis not present

## 2024-03-17 DIAGNOSIS — N186 End stage renal disease: Secondary | ICD-10-CM | POA: Diagnosis not present

## 2024-03-17 DIAGNOSIS — Z992 Dependence on renal dialysis: Secondary | ICD-10-CM | POA: Diagnosis not present

## 2024-03-17 DIAGNOSIS — N2581 Secondary hyperparathyroidism of renal origin: Secondary | ICD-10-CM | POA: Diagnosis not present

## 2024-03-17 DIAGNOSIS — I129 Hypertensive chronic kidney disease with stage 1 through stage 4 chronic kidney disease, or unspecified chronic kidney disease: Secondary | ICD-10-CM | POA: Diagnosis not present

## 2024-03-19 DIAGNOSIS — N2581 Secondary hyperparathyroidism of renal origin: Secondary | ICD-10-CM | POA: Diagnosis not present

## 2024-03-19 DIAGNOSIS — N186 End stage renal disease: Secondary | ICD-10-CM | POA: Diagnosis not present

## 2024-03-19 DIAGNOSIS — Z992 Dependence on renal dialysis: Secondary | ICD-10-CM | POA: Diagnosis not present

## 2024-03-22 DIAGNOSIS — Z992 Dependence on renal dialysis: Secondary | ICD-10-CM | POA: Diagnosis not present

## 2024-03-22 DIAGNOSIS — N2581 Secondary hyperparathyroidism of renal origin: Secondary | ICD-10-CM | POA: Diagnosis not present

## 2024-03-22 DIAGNOSIS — N186 End stage renal disease: Secondary | ICD-10-CM | POA: Diagnosis not present

## 2024-03-23 DIAGNOSIS — G5613 Other lesions of median nerve, bilateral upper limbs: Secondary | ICD-10-CM | POA: Diagnosis not present

## 2024-03-24 DIAGNOSIS — Z992 Dependence on renal dialysis: Secondary | ICD-10-CM | POA: Diagnosis not present

## 2024-03-24 DIAGNOSIS — N2581 Secondary hyperparathyroidism of renal origin: Secondary | ICD-10-CM | POA: Diagnosis not present

## 2024-03-24 DIAGNOSIS — N186 End stage renal disease: Secondary | ICD-10-CM | POA: Diagnosis not present

## 2024-03-26 DIAGNOSIS — N2581 Secondary hyperparathyroidism of renal origin: Secondary | ICD-10-CM | POA: Diagnosis not present

## 2024-03-26 DIAGNOSIS — Z992 Dependence on renal dialysis: Secondary | ICD-10-CM | POA: Diagnosis not present

## 2024-03-26 DIAGNOSIS — N186 End stage renal disease: Secondary | ICD-10-CM | POA: Diagnosis not present

## 2024-03-29 DIAGNOSIS — N186 End stage renal disease: Secondary | ICD-10-CM | POA: Diagnosis not present

## 2024-03-29 DIAGNOSIS — N2581 Secondary hyperparathyroidism of renal origin: Secondary | ICD-10-CM | POA: Diagnosis not present

## 2024-03-29 DIAGNOSIS — Z992 Dependence on renal dialysis: Secondary | ICD-10-CM | POA: Diagnosis not present

## 2024-03-31 DIAGNOSIS — N2581 Secondary hyperparathyroidism of renal origin: Secondary | ICD-10-CM | POA: Diagnosis not present

## 2024-03-31 DIAGNOSIS — N186 End stage renal disease: Secondary | ICD-10-CM | POA: Diagnosis not present

## 2024-03-31 DIAGNOSIS — G5603 Carpal tunnel syndrome, bilateral upper limbs: Secondary | ICD-10-CM | POA: Diagnosis not present

## 2024-03-31 DIAGNOSIS — Z992 Dependence on renal dialysis: Secondary | ICD-10-CM | POA: Diagnosis not present

## 2024-04-02 DIAGNOSIS — Z992 Dependence on renal dialysis: Secondary | ICD-10-CM | POA: Diagnosis not present

## 2024-04-02 DIAGNOSIS — N186 End stage renal disease: Secondary | ICD-10-CM | POA: Diagnosis not present

## 2024-04-02 DIAGNOSIS — N2581 Secondary hyperparathyroidism of renal origin: Secondary | ICD-10-CM | POA: Diagnosis not present

## 2024-04-05 DIAGNOSIS — N2581 Secondary hyperparathyroidism of renal origin: Secondary | ICD-10-CM | POA: Diagnosis not present

## 2024-04-05 DIAGNOSIS — N186 End stage renal disease: Secondary | ICD-10-CM | POA: Diagnosis not present

## 2024-04-05 DIAGNOSIS — Z992 Dependence on renal dialysis: Secondary | ICD-10-CM | POA: Diagnosis not present

## 2024-04-07 DIAGNOSIS — N2581 Secondary hyperparathyroidism of renal origin: Secondary | ICD-10-CM | POA: Diagnosis not present

## 2024-04-07 DIAGNOSIS — N186 End stage renal disease: Secondary | ICD-10-CM | POA: Diagnosis not present

## 2024-04-07 DIAGNOSIS — Z992 Dependence on renal dialysis: Secondary | ICD-10-CM | POA: Diagnosis not present

## 2024-04-09 DIAGNOSIS — Z992 Dependence on renal dialysis: Secondary | ICD-10-CM | POA: Diagnosis not present

## 2024-04-09 DIAGNOSIS — N186 End stage renal disease: Secondary | ICD-10-CM | POA: Diagnosis not present

## 2024-04-09 DIAGNOSIS — N2581 Secondary hyperparathyroidism of renal origin: Secondary | ICD-10-CM | POA: Diagnosis not present

## 2024-04-11 DIAGNOSIS — N186 End stage renal disease: Secondary | ICD-10-CM | POA: Diagnosis not present

## 2024-04-11 DIAGNOSIS — Z992 Dependence on renal dialysis: Secondary | ICD-10-CM | POA: Diagnosis not present

## 2024-04-11 DIAGNOSIS — N2581 Secondary hyperparathyroidism of renal origin: Secondary | ICD-10-CM | POA: Diagnosis not present

## 2024-04-12 DIAGNOSIS — N186 End stage renal disease: Secondary | ICD-10-CM | POA: Diagnosis not present

## 2024-04-12 DIAGNOSIS — Z992 Dependence on renal dialysis: Secondary | ICD-10-CM | POA: Diagnosis not present

## 2024-04-12 DIAGNOSIS — N2581 Secondary hyperparathyroidism of renal origin: Secondary | ICD-10-CM | POA: Diagnosis not present

## 2024-04-14 DIAGNOSIS — Z992 Dependence on renal dialysis: Secondary | ICD-10-CM | POA: Diagnosis not present

## 2024-04-14 DIAGNOSIS — N2581 Secondary hyperparathyroidism of renal origin: Secondary | ICD-10-CM | POA: Diagnosis not present

## 2024-04-14 DIAGNOSIS — N186 End stage renal disease: Secondary | ICD-10-CM | POA: Diagnosis not present

## 2024-04-17 DIAGNOSIS — N186 End stage renal disease: Secondary | ICD-10-CM | POA: Diagnosis not present

## 2024-04-17 DIAGNOSIS — Z992 Dependence on renal dialysis: Secondary | ICD-10-CM | POA: Diagnosis not present

## 2024-04-17 DIAGNOSIS — I129 Hypertensive chronic kidney disease with stage 1 through stage 4 chronic kidney disease, or unspecified chronic kidney disease: Secondary | ICD-10-CM | POA: Diagnosis not present

## 2024-04-19 DIAGNOSIS — N186 End stage renal disease: Secondary | ICD-10-CM | POA: Diagnosis not present

## 2024-04-19 DIAGNOSIS — N2581 Secondary hyperparathyroidism of renal origin: Secondary | ICD-10-CM | POA: Diagnosis not present

## 2024-04-19 DIAGNOSIS — Z992 Dependence on renal dialysis: Secondary | ICD-10-CM | POA: Diagnosis not present

## 2024-04-21 DIAGNOSIS — N2581 Secondary hyperparathyroidism of renal origin: Secondary | ICD-10-CM | POA: Diagnosis not present

## 2024-04-21 DIAGNOSIS — N186 End stage renal disease: Secondary | ICD-10-CM | POA: Diagnosis not present

## 2024-04-21 DIAGNOSIS — Z992 Dependence on renal dialysis: Secondary | ICD-10-CM | POA: Diagnosis not present

## 2024-04-21 DIAGNOSIS — G5603 Carpal tunnel syndrome, bilateral upper limbs: Secondary | ICD-10-CM | POA: Diagnosis not present

## 2024-04-23 DIAGNOSIS — N2581 Secondary hyperparathyroidism of renal origin: Secondary | ICD-10-CM | POA: Diagnosis not present

## 2024-04-23 DIAGNOSIS — N186 End stage renal disease: Secondary | ICD-10-CM | POA: Diagnosis not present

## 2024-04-23 DIAGNOSIS — Z992 Dependence on renal dialysis: Secondary | ICD-10-CM | POA: Diagnosis not present

## 2024-04-26 DIAGNOSIS — N2581 Secondary hyperparathyroidism of renal origin: Secondary | ICD-10-CM | POA: Diagnosis not present

## 2024-04-26 DIAGNOSIS — N186 End stage renal disease: Secondary | ICD-10-CM | POA: Diagnosis not present

## 2024-04-26 DIAGNOSIS — Z992 Dependence on renal dialysis: Secondary | ICD-10-CM | POA: Diagnosis not present

## 2024-04-28 DIAGNOSIS — N186 End stage renal disease: Secondary | ICD-10-CM | POA: Diagnosis not present

## 2024-04-28 DIAGNOSIS — N2581 Secondary hyperparathyroidism of renal origin: Secondary | ICD-10-CM | POA: Diagnosis not present

## 2024-04-28 DIAGNOSIS — Z992 Dependence on renal dialysis: Secondary | ICD-10-CM | POA: Diagnosis not present

## 2024-04-30 DIAGNOSIS — N186 End stage renal disease: Secondary | ICD-10-CM | POA: Diagnosis not present

## 2024-04-30 DIAGNOSIS — N2581 Secondary hyperparathyroidism of renal origin: Secondary | ICD-10-CM | POA: Diagnosis not present

## 2024-04-30 DIAGNOSIS — Z992 Dependence on renal dialysis: Secondary | ICD-10-CM | POA: Diagnosis not present

## 2024-05-03 DIAGNOSIS — N186 End stage renal disease: Secondary | ICD-10-CM | POA: Diagnosis not present

## 2024-05-03 DIAGNOSIS — N2581 Secondary hyperparathyroidism of renal origin: Secondary | ICD-10-CM | POA: Diagnosis not present

## 2024-05-03 DIAGNOSIS — Z992 Dependence on renal dialysis: Secondary | ICD-10-CM | POA: Diagnosis not present

## 2024-05-05 DIAGNOSIS — Z992 Dependence on renal dialysis: Secondary | ICD-10-CM | POA: Diagnosis not present

## 2024-05-05 DIAGNOSIS — N186 End stage renal disease: Secondary | ICD-10-CM | POA: Diagnosis not present

## 2024-05-05 DIAGNOSIS — N2581 Secondary hyperparathyroidism of renal origin: Secondary | ICD-10-CM | POA: Diagnosis not present

## 2024-05-06 DIAGNOSIS — R001 Bradycardia, unspecified: Secondary | ICD-10-CM | POA: Diagnosis not present

## 2024-05-07 DIAGNOSIS — N2581 Secondary hyperparathyroidism of renal origin: Secondary | ICD-10-CM | POA: Diagnosis not present

## 2024-05-07 DIAGNOSIS — N186 End stage renal disease: Secondary | ICD-10-CM | POA: Diagnosis not present

## 2024-05-07 DIAGNOSIS — Z992 Dependence on renal dialysis: Secondary | ICD-10-CM | POA: Diagnosis not present

## 2024-05-10 DIAGNOSIS — Z992 Dependence on renal dialysis: Secondary | ICD-10-CM | POA: Diagnosis not present

## 2024-05-10 DIAGNOSIS — N2581 Secondary hyperparathyroidism of renal origin: Secondary | ICD-10-CM | POA: Diagnosis not present

## 2024-05-10 DIAGNOSIS — N186 End stage renal disease: Secondary | ICD-10-CM | POA: Diagnosis not present

## 2024-05-12 DIAGNOSIS — N186 End stage renal disease: Secondary | ICD-10-CM | POA: Diagnosis not present

## 2024-05-12 DIAGNOSIS — Z992 Dependence on renal dialysis: Secondary | ICD-10-CM | POA: Diagnosis not present

## 2024-05-12 DIAGNOSIS — N2581 Secondary hyperparathyroidism of renal origin: Secondary | ICD-10-CM | POA: Diagnosis not present

## 2024-05-14 DIAGNOSIS — N186 End stage renal disease: Secondary | ICD-10-CM | POA: Diagnosis not present

## 2024-05-14 DIAGNOSIS — Z992 Dependence on renal dialysis: Secondary | ICD-10-CM | POA: Diagnosis not present

## 2024-05-14 DIAGNOSIS — N2581 Secondary hyperparathyroidism of renal origin: Secondary | ICD-10-CM | POA: Diagnosis not present

## 2024-05-17 DIAGNOSIS — I129 Hypertensive chronic kidney disease with stage 1 through stage 4 chronic kidney disease, or unspecified chronic kidney disease: Secondary | ICD-10-CM | POA: Diagnosis not present

## 2024-05-17 DIAGNOSIS — Z992 Dependence on renal dialysis: Secondary | ICD-10-CM | POA: Diagnosis not present

## 2024-05-17 DIAGNOSIS — N186 End stage renal disease: Secondary | ICD-10-CM | POA: Diagnosis not present

## 2024-05-17 DIAGNOSIS — N2581 Secondary hyperparathyroidism of renal origin: Secondary | ICD-10-CM | POA: Diagnosis not present

## 2024-05-18 DIAGNOSIS — Z992 Dependence on renal dialysis: Secondary | ICD-10-CM | POA: Diagnosis not present

## 2024-05-18 DIAGNOSIS — N185 Chronic kidney disease, stage 5: Secondary | ICD-10-CM | POA: Diagnosis not present

## 2024-05-18 DIAGNOSIS — R079 Chest pain, unspecified: Secondary | ICD-10-CM | POA: Diagnosis not present

## 2024-05-18 DIAGNOSIS — N186 End stage renal disease: Secondary | ICD-10-CM | POA: Diagnosis not present

## 2024-05-18 DIAGNOSIS — I509 Heart failure, unspecified: Secondary | ICD-10-CM | POA: Diagnosis not present

## 2024-05-18 DIAGNOSIS — I120 Hypertensive chronic kidney disease with stage 5 chronic kidney disease or end stage renal disease: Secondary | ICD-10-CM | POA: Diagnosis not present

## 2024-05-19 DIAGNOSIS — M109 Gout, unspecified: Secondary | ICD-10-CM | POA: Diagnosis not present

## 2024-05-19 DIAGNOSIS — K219 Gastro-esophageal reflux disease without esophagitis: Secondary | ICD-10-CM | POA: Diagnosis not present

## 2024-05-19 DIAGNOSIS — R030 Elevated blood-pressure reading, without diagnosis of hypertension: Secondary | ICD-10-CM | POA: Diagnosis not present

## 2024-05-19 DIAGNOSIS — J309 Allergic rhinitis, unspecified: Secondary | ICD-10-CM | POA: Diagnosis not present

## 2024-05-19 DIAGNOSIS — E785 Hyperlipidemia, unspecified: Secondary | ICD-10-CM | POA: Diagnosis not present

## 2024-05-19 DIAGNOSIS — E669 Obesity, unspecified: Secondary | ICD-10-CM | POA: Diagnosis not present

## 2024-05-19 DIAGNOSIS — N186 End stage renal disease: Secondary | ICD-10-CM | POA: Diagnosis not present

## 2024-05-19 DIAGNOSIS — Z940 Kidney transplant status: Secondary | ICD-10-CM | POA: Diagnosis not present

## 2024-05-19 DIAGNOSIS — Z7982 Long term (current) use of aspirin: Secondary | ICD-10-CM | POA: Diagnosis not present

## 2024-05-19 DIAGNOSIS — D689 Coagulation defect, unspecified: Secondary | ICD-10-CM | POA: Diagnosis not present

## 2024-05-19 DIAGNOSIS — Z79899 Other long term (current) drug therapy: Secondary | ICD-10-CM | POA: Diagnosis not present

## 2024-05-19 DIAGNOSIS — N2581 Secondary hyperparathyroidism of renal origin: Secondary | ICD-10-CM | POA: Diagnosis not present

## 2024-05-19 DIAGNOSIS — Z992 Dependence on renal dialysis: Secondary | ICD-10-CM | POA: Diagnosis not present

## 2024-05-19 DIAGNOSIS — E569 Vitamin deficiency, unspecified: Secondary | ICD-10-CM | POA: Diagnosis not present

## 2024-05-25 ENCOUNTER — Encounter: Payer: Self-pay | Admitting: Podiatry

## 2024-05-25 ENCOUNTER — Ambulatory Visit: Admitting: Podiatry

## 2024-05-25 DIAGNOSIS — M79674 Pain in right toe(s): Secondary | ICD-10-CM | POA: Diagnosis not present

## 2024-05-25 DIAGNOSIS — M79675 Pain in left toe(s): Secondary | ICD-10-CM | POA: Diagnosis not present

## 2024-05-25 DIAGNOSIS — B351 Tinea unguium: Secondary | ICD-10-CM

## 2024-05-25 DIAGNOSIS — D689 Coagulation defect, unspecified: Secondary | ICD-10-CM | POA: Diagnosis not present

## 2024-05-25 NOTE — Progress Notes (Signed)
 This patient returns to my office for at risk foot care.  This patient requires this care by a professional since this patient will be at risk due to having coagulation defect and CKD. This patient is unable to cut nails himself since the patient cannot reach his nails.These nails are painful walking and wearing shoes.  This patient presents for at risk foot care today.  General Appearance  Alert, conversant and in no acute stress.  Vascular  Dorsalis pedis and posterior tibial  pulses are palpable  bilaterally.  Capillary return is within normal limits  bilaterally. Temperature is within normal limits  bilaterally.  Neurologic  Senn-Weinstein monofilament wire test within normal limits  bilaterally. Muscle power within normal limits bilaterally.  Nails Thick disfigured discolored nails with subungual debris  from hallux to fifth toes bilaterally. No evidence of bacterial infection or drainage bilaterally.  Orthopedic  No limitations of motion  feet .  No crepitus or effusions noted.  No bony pathology or digital deformities noted.  Skin  normotropic skin with no porokeratosis noted bilaterally.  No signs of infections or ulcers noted.     Onychomycosis  Pain in right toes  Pain in left toes  Consent was obtained for treatment procedures.   Mechanical debridement of nails 1-5  bilaterally performed with a nail nipper.  Filed with dremel without incident. Patient was told to make an appointment with Dr.  Tobie for his ankle.   Return office visit     3 months                 Told patient to return for periodic foot care and evaluation due to potential at risk complications.   Cordella Bold DPM

## 2024-05-26 DIAGNOSIS — N2581 Secondary hyperparathyroidism of renal origin: Secondary | ICD-10-CM | POA: Diagnosis not present

## 2024-05-31 DIAGNOSIS — N2581 Secondary hyperparathyroidism of renal origin: Secondary | ICD-10-CM | POA: Diagnosis not present

## 2024-05-31 DIAGNOSIS — N186 End stage renal disease: Secondary | ICD-10-CM | POA: Diagnosis not present

## 2024-05-31 DIAGNOSIS — Z992 Dependence on renal dialysis: Secondary | ICD-10-CM | POA: Diagnosis not present

## 2024-06-02 DIAGNOSIS — N186 End stage renal disease: Secondary | ICD-10-CM | POA: Diagnosis not present

## 2024-06-04 DIAGNOSIS — N2581 Secondary hyperparathyroidism of renal origin: Secondary | ICD-10-CM | POA: Diagnosis not present

## 2024-06-04 DIAGNOSIS — Z992 Dependence on renal dialysis: Secondary | ICD-10-CM | POA: Diagnosis not present

## 2024-06-04 DIAGNOSIS — N186 End stage renal disease: Secondary | ICD-10-CM | POA: Diagnosis not present

## 2024-06-07 DIAGNOSIS — Z992 Dependence on renal dialysis: Secondary | ICD-10-CM | POA: Diagnosis not present

## 2024-06-07 DIAGNOSIS — N186 End stage renal disease: Secondary | ICD-10-CM | POA: Diagnosis not present

## 2024-06-17 DIAGNOSIS — I129 Hypertensive chronic kidney disease with stage 1 through stage 4 chronic kidney disease, or unspecified chronic kidney disease: Secondary | ICD-10-CM | POA: Diagnosis not present

## 2024-06-17 DIAGNOSIS — Z992 Dependence on renal dialysis: Secondary | ICD-10-CM | POA: Diagnosis not present

## 2024-06-17 DIAGNOSIS — N186 End stage renal disease: Secondary | ICD-10-CM | POA: Diagnosis not present

## 2024-06-18 DIAGNOSIS — N186 End stage renal disease: Secondary | ICD-10-CM | POA: Diagnosis not present

## 2024-06-18 DIAGNOSIS — N2581 Secondary hyperparathyroidism of renal origin: Secondary | ICD-10-CM | POA: Diagnosis not present

## 2024-06-18 DIAGNOSIS — Z992 Dependence on renal dialysis: Secondary | ICD-10-CM | POA: Diagnosis not present

## 2024-06-21 DIAGNOSIS — Z992 Dependence on renal dialysis: Secondary | ICD-10-CM | POA: Diagnosis not present

## 2024-06-21 DIAGNOSIS — N2581 Secondary hyperparathyroidism of renal origin: Secondary | ICD-10-CM | POA: Diagnosis not present

## 2024-06-28 DIAGNOSIS — Z992 Dependence on renal dialysis: Secondary | ICD-10-CM | POA: Diagnosis not present

## 2024-06-30 DIAGNOSIS — N186 End stage renal disease: Secondary | ICD-10-CM | POA: Diagnosis not present

## 2024-06-30 DIAGNOSIS — N2581 Secondary hyperparathyroidism of renal origin: Secondary | ICD-10-CM | POA: Diagnosis not present

## 2024-06-30 DIAGNOSIS — Z992 Dependence on renal dialysis: Secondary | ICD-10-CM | POA: Diagnosis not present

## 2024-07-02 DIAGNOSIS — N2581 Secondary hyperparathyroidism of renal origin: Secondary | ICD-10-CM | POA: Diagnosis not present

## 2024-07-02 DIAGNOSIS — Z992 Dependence on renal dialysis: Secondary | ICD-10-CM | POA: Diagnosis not present

## 2024-07-04 DIAGNOSIS — N186 End stage renal disease: Secondary | ICD-10-CM | POA: Diagnosis not present

## 2024-07-05 DIAGNOSIS — N2581 Secondary hyperparathyroidism of renal origin: Secondary | ICD-10-CM | POA: Diagnosis not present

## 2024-07-05 DIAGNOSIS — Z992 Dependence on renal dialysis: Secondary | ICD-10-CM | POA: Diagnosis not present

## 2024-07-07 DIAGNOSIS — N2581 Secondary hyperparathyroidism of renal origin: Secondary | ICD-10-CM | POA: Diagnosis not present

## 2024-07-07 DIAGNOSIS — Z992 Dependence on renal dialysis: Secondary | ICD-10-CM | POA: Diagnosis not present

## 2024-07-08 DIAGNOSIS — G5603 Carpal tunnel syndrome, bilateral upper limbs: Secondary | ICD-10-CM | POA: Diagnosis not present

## 2024-07-09 DIAGNOSIS — N2581 Secondary hyperparathyroidism of renal origin: Secondary | ICD-10-CM | POA: Diagnosis not present

## 2024-07-09 DIAGNOSIS — Z992 Dependence on renal dialysis: Secondary | ICD-10-CM | POA: Diagnosis not present

## 2024-07-09 DIAGNOSIS — N186 End stage renal disease: Secondary | ICD-10-CM | POA: Diagnosis not present

## 2024-07-11 DIAGNOSIS — N186 End stage renal disease: Secondary | ICD-10-CM | POA: Diagnosis not present

## 2024-07-16 DIAGNOSIS — N2581 Secondary hyperparathyroidism of renal origin: Secondary | ICD-10-CM | POA: Diagnosis not present

## 2024-07-16 DIAGNOSIS — Z992 Dependence on renal dialysis: Secondary | ICD-10-CM | POA: Diagnosis not present

## 2024-07-17 DIAGNOSIS — Z992 Dependence on renal dialysis: Secondary | ICD-10-CM | POA: Diagnosis not present

## 2024-07-17 DIAGNOSIS — N186 End stage renal disease: Secondary | ICD-10-CM | POA: Diagnosis not present

## 2024-07-17 DIAGNOSIS — I129 Hypertensive chronic kidney disease with stage 1 through stage 4 chronic kidney disease, or unspecified chronic kidney disease: Secondary | ICD-10-CM | POA: Diagnosis not present

## 2024-07-27 DIAGNOSIS — I10 Essential (primary) hypertension: Secondary | ICD-10-CM | POA: Diagnosis not present

## 2024-07-27 DIAGNOSIS — Z992 Dependence on renal dialysis: Secondary | ICD-10-CM | POA: Diagnosis not present

## 2024-07-27 DIAGNOSIS — Z0001 Encounter for general adult medical examination with abnormal findings: Secondary | ICD-10-CM | POA: Diagnosis not present

## 2024-08-24 ENCOUNTER — Ambulatory Visit: Admitting: Podiatry

## 2024-09-15 NOTE — Progress Notes (Unsigned)
" ° ° °  Established Dialysis Access   History of Present Illness   Hanley Woerner is a 59 y.o. ([birth year]) male who presents for evaluation of growing aneurysms at request of Dr. Gearline. He has a left basilic vein fistula. This was created in April of 2021 by Dr. Oris.  He explains that ***    Current Outpatient Medications  Medication Sig Dispense Refill   allopurinol  (ZYLOPRIM ) 100 MG tablet Take 400 mg by mouth daily.     aspirin EC 81 MG tablet Take 81 mg by mouth daily. Swallow whole.     atorvastatin (LIPITOR) 40 MG tablet Take 40 mg by mouth daily.     cetirizine (ZYRTEC) 10 MG tablet Take 10 mg by mouth daily.     fluticasone (FLONASE) 50 MCG/ACT nasal spray Place 2 sprays into both nostrils daily as needed for allergies.   1   multivitamin (RENA-VIT) TABS tablet Take 1 tablet by mouth daily.     pantoprazole (PROTONIX) 40 MG tablet Take 40 mg by mouth daily.     sevelamer (RENAGEL) 800 MG tablet Take 1,600 mg by mouth 3 (three) times daily with meals.     No current facility-administered medications for this visit.    On ROS today: ***, ***   Physical Examination  There were no vitals filed for this visit. There is no height or weight on file to calculate BMI.  General {LOC:19197::Somulent,Alert}, {Orientation:19197::Confused,O x 3}, {Weight:19197::Obese,Cachectic,WD}, {General state of health:19197::Ill appearing,Elderly,NAD}  Pulmonary {Chest wall:19197::Asx chest movement,Sym exp}, {Air movt:19197::Decreased *** air movt,good B air movt}, {BS:19197::rales on ***,rhonchi on ***,wheezing on ***,CTA B}  Cardiac {Rhythm:19197::Irregularly, irregular rate and rhythm,RRR, Nl S1, S2}, {Murmur:19197::Murmur present: ***,no Murmurs}, {Rubs:19197::Rub present: ***,No rubs}, {Gallop:19197::Gallop present: ***,No S3,S4}  Vascular Vessel Right Left  Radial {Palpable:19197::Not palpable,Faintly palpable,Palpable}  {Palpable:19197::Not palpable,Faintly palpable,Palpable}  Brachial {Palpable:19197::Not palpable,Faintly palpable,Palpable} {Palpable:19197::Not palpable,Faintly palpable,Palpable}  Ulnar {Palpable:19197::Not palpable,Faintly palpable,Palpable} {Palpable:19197::Not palpable,Faintly palpable,Palpable}    Musculo- skeletal M/S 5/5 throughout {MS:19197::except ***, }, Extremities without ischemic changes {MS:19197::except ***, }  Neurologic A&O; CN grossly intact     Non-invasive Vascular Imaging   {side of body:30421359} Arm Access Duplex  (***):  Diameters:  *** mm Depth:  *** mm PSV:  *** c/s  BUE Doppler (***):  R arm:  Brachial: {Signals:19197::none,mono,bi,tri}, *** mm Radial: {Signals:19197::none,mono,bi,tri}, *** mm Ulnar: {Signals:19197::none,mono,bi,tri}, *** mm L arm:  Brachial: {Signals:19197::none,mono,bi,tri}, *** mm Radial: {Signals:19197::none,mono,bi,tri}, *** mm Ulnar: {Signals:19197::none,mono,bi,tri}, *** mm  BUE Vein Mapping  (***):  R arm: acceptable vein conduits include *** L arm: acceptable vein conduits include ***    Medical Decision Making   Mohd Clemons is a 59 y.o. male who presents with {KidneyDisease:19197::ESRD,chronic kidney disease stage ***} requiring hemodialysis.   ***   Teretha Damme, PA-C [facility] [place] Office: [phone]  Clinic MD: *** "

## 2024-09-16 ENCOUNTER — Ambulatory Visit

## 2024-11-04 ENCOUNTER — Ambulatory Visit
